# Patient Record
Sex: Female | Born: 1960 | Race: White | Hispanic: No | Marital: Single | Smoking: Never smoker
Health system: Southern US, Community
[De-identification: ages and names within clinical notes are randomized; demographics above are authoritative.]

## PROBLEM LIST (undated history)

## (undated) DIAGNOSIS — M4802 Spinal stenosis, cervical region: Secondary | ICD-10-CM

## (undated) DIAGNOSIS — M791 Myalgia, unspecified site: Secondary | ICD-10-CM

## (undated) DIAGNOSIS — R569 Unspecified convulsions: Secondary | ICD-10-CM

## (undated) DIAGNOSIS — K589 Irritable bowel syndrome without diarrhea: Secondary | ICD-10-CM

## (undated) DIAGNOSIS — G8929 Other chronic pain: Secondary | ICD-10-CM

## (undated) DIAGNOSIS — Z87442 Personal history of urinary calculi: Secondary | ICD-10-CM

## (undated) DIAGNOSIS — M2550 Pain in unspecified joint: Secondary | ICD-10-CM

## (undated) DIAGNOSIS — M2540 Effusion, unspecified joint: Secondary | ICD-10-CM

## (undated) DIAGNOSIS — G992 Myelopathy in diseases classified elsewhere: Secondary | ICD-10-CM

## (undated) DIAGNOSIS — F329 Major depressive disorder, single episode, unspecified: Secondary | ICD-10-CM

## (undated) DIAGNOSIS — Z9289 Personal history of other medical treatment: Secondary | ICD-10-CM

## (undated) DIAGNOSIS — R519 Headache, unspecified: Secondary | ICD-10-CM

## (undated) DIAGNOSIS — G939 Disorder of brain, unspecified: Secondary | ICD-10-CM

## (undated) DIAGNOSIS — G249 Dystonia, unspecified: Secondary | ICD-10-CM

## (undated) DIAGNOSIS — Z8709 Personal history of other diseases of the respiratory system: Secondary | ICD-10-CM

## (undated) DIAGNOSIS — R351 Nocturia: Secondary | ICD-10-CM

## (undated) DIAGNOSIS — M549 Dorsalgia, unspecified: Secondary | ICD-10-CM

## (undated) DIAGNOSIS — C801 Malignant (primary) neoplasm, unspecified: Secondary | ICD-10-CM

## (undated) DIAGNOSIS — K219 Gastro-esophageal reflux disease without esophagitis: Secondary | ICD-10-CM

## (undated) DIAGNOSIS — F419 Anxiety disorder, unspecified: Secondary | ICD-10-CM

## (undated) DIAGNOSIS — E119 Type 2 diabetes mellitus without complications: Secondary | ICD-10-CM

## (undated) DIAGNOSIS — M1990 Unspecified osteoarthritis, unspecified site: Secondary | ICD-10-CM

## (undated) DIAGNOSIS — R51 Headache: Secondary | ICD-10-CM

## (undated) DIAGNOSIS — I10 Essential (primary) hypertension: Secondary | ICD-10-CM

## (undated) DIAGNOSIS — F32A Depression, unspecified: Secondary | ICD-10-CM

## (undated) DIAGNOSIS — D649 Anemia, unspecified: Secondary | ICD-10-CM

## (undated) HISTORY — PX: LIPOMA EXCISION: SHX5283

## (undated) HISTORY — DX: Myalgia, unspecified site: M79.10

## (undated) HISTORY — PX: SPINE SURGERY: SHX786

## (undated) HISTORY — PX: TRACHEOSTOMY: SUR1362

## (undated) HISTORY — PX: ESOPHAGOGASTRODUODENOSCOPY: SHX1529

## (undated) HISTORY — DX: Unspecified convulsions: R56.9

## (undated) HISTORY — DX: Unspecified osteoarthritis, unspecified site: M19.90

## (undated) HISTORY — PX: CHOLECYSTECTOMY: SHX55

## (undated) HISTORY — DX: Anxiety disorder, unspecified: F41.9

---
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
## 2002-06-29 ENCOUNTER — Encounter: Payer: Self-pay | Admitting: Neurosurgery

## 2002-07-04 ENCOUNTER — Encounter: Payer: Self-pay | Admitting: Neurosurgery

## 2002-07-04 ENCOUNTER — Inpatient Hospital Stay (HOSPITAL_COMMUNITY): Admission: RE | Admit: 2002-07-04 | Discharge: 2002-07-06 | Payer: Self-pay | Admitting: Neurosurgery

## 2002-07-06 ENCOUNTER — Inpatient Hospital Stay (HOSPITAL_COMMUNITY)
Admission: RE | Admit: 2002-07-06 | Discharge: 2002-07-26 | Payer: Self-pay | Admitting: Physical Medicine & Rehabilitation

## 2002-08-14 ENCOUNTER — Encounter: Payer: Self-pay | Admitting: Neurosurgery

## 2002-08-14 ENCOUNTER — Encounter: Admission: RE | Admit: 2002-08-14 | Discharge: 2002-08-14 | Payer: Self-pay | Admitting: Neurosurgery

## 2002-09-05 ENCOUNTER — Encounter: Payer: Self-pay | Admitting: Physical Medicine & Rehabilitation

## 2002-09-05 ENCOUNTER — Encounter
Admission: RE | Admit: 2002-09-05 | Discharge: 2002-09-05 | Payer: Self-pay | Admitting: Physical Medicine & Rehabilitation

## 2002-09-25 ENCOUNTER — Encounter: Admission: RE | Admit: 2002-09-25 | Discharge: 2002-09-25 | Payer: Self-pay | Admitting: Neurosurgery

## 2002-09-25 ENCOUNTER — Encounter: Payer: Self-pay | Admitting: Neurosurgery

## 2002-12-04 ENCOUNTER — Encounter
Admission: RE | Admit: 2002-12-04 | Discharge: 2002-12-04 | Payer: Self-pay | Admitting: Physical Medicine & Rehabilitation

## 2002-12-04 ENCOUNTER — Encounter: Payer: Self-pay | Admitting: Physical Medicine & Rehabilitation

## 2003-02-18 ENCOUNTER — Encounter
Admission: RE | Admit: 2003-02-18 | Discharge: 2003-05-19 | Payer: Self-pay | Admitting: Physical Medicine & Rehabilitation

## 2003-05-20 ENCOUNTER — Encounter
Admission: RE | Admit: 2003-05-20 | Discharge: 2003-08-18 | Payer: Self-pay | Admitting: Physical Medicine & Rehabilitation

## 2003-06-27 ENCOUNTER — Ambulatory Visit (HOSPITAL_COMMUNITY)
Admission: RE | Admit: 2003-06-27 | Discharge: 2003-06-27 | Payer: Self-pay | Admitting: Physical Medicine & Rehabilitation

## 2003-06-27 ENCOUNTER — Encounter: Payer: Self-pay | Admitting: Physical Medicine & Rehabilitation

## 2003-08-01 ENCOUNTER — Encounter: Payer: Self-pay | Admitting: Neurosurgery

## 2003-08-06 ENCOUNTER — Encounter: Payer: Self-pay | Admitting: Neurosurgery

## 2003-08-06 ENCOUNTER — Inpatient Hospital Stay (HOSPITAL_COMMUNITY): Admission: RE | Admit: 2003-08-06 | Discharge: 2003-08-09 | Payer: Self-pay | Admitting: Neurosurgery

## 2003-08-09 ENCOUNTER — Encounter: Payer: Self-pay | Admitting: Physical Medicine & Rehabilitation

## 2003-08-09 ENCOUNTER — Inpatient Hospital Stay (HOSPITAL_COMMUNITY)
Admission: RE | Admit: 2003-08-09 | Discharge: 2003-08-22 | Payer: Self-pay | Admitting: Physical Medicine & Rehabilitation

## 2003-09-25 ENCOUNTER — Encounter: Admission: RE | Admit: 2003-09-25 | Discharge: 2003-09-25 | Payer: Self-pay | Admitting: Neurosurgery

## 2003-09-27 ENCOUNTER — Encounter
Admission: RE | Admit: 2003-09-27 | Discharge: 2003-12-26 | Payer: Self-pay | Admitting: Physical Medicine & Rehabilitation

## 2003-10-28 ENCOUNTER — Encounter
Admission: RE | Admit: 2003-10-28 | Discharge: 2004-01-26 | Payer: Self-pay | Admitting: Physical Medicine & Rehabilitation

## 2003-11-26 ENCOUNTER — Encounter: Admission: RE | Admit: 2003-11-26 | Discharge: 2003-11-26 | Payer: Self-pay | Admitting: Neurosurgery

## 2004-02-03 ENCOUNTER — Encounter
Admission: RE | Admit: 2004-02-03 | Discharge: 2004-05-03 | Payer: Self-pay | Admitting: Physical Medicine & Rehabilitation

## 2004-05-11 ENCOUNTER — Encounter
Admission: RE | Admit: 2004-05-11 | Discharge: 2004-06-25 | Payer: Self-pay | Admitting: Physical Medicine & Rehabilitation

## 2004-05-22 ENCOUNTER — Encounter
Admission: RE | Admit: 2004-05-22 | Discharge: 2004-08-20 | Payer: Self-pay | Admitting: Physical Medicine & Rehabilitation

## 2004-06-25 ENCOUNTER — Encounter
Admission: RE | Admit: 2004-06-25 | Discharge: 2004-09-23 | Payer: Self-pay | Admitting: Physical Medicine & Rehabilitation

## 2004-06-26 ENCOUNTER — Ambulatory Visit: Payer: Self-pay | Admitting: Physical Medicine & Rehabilitation

## 2004-07-29 ENCOUNTER — Ambulatory Visit (HOSPITAL_COMMUNITY)
Admission: RE | Admit: 2004-07-29 | Discharge: 2004-07-29 | Payer: Self-pay | Admitting: Physical Medicine & Rehabilitation

## 2004-09-08 ENCOUNTER — Ambulatory Visit: Payer: Self-pay | Admitting: Physical Medicine & Rehabilitation

## 2004-11-26 ENCOUNTER — Ambulatory Visit: Payer: Self-pay | Admitting: Physical Medicine & Rehabilitation

## 2004-11-26 ENCOUNTER — Encounter
Admission: RE | Admit: 2004-11-26 | Discharge: 2005-02-24 | Payer: Self-pay | Admitting: Physical Medicine & Rehabilitation

## 2005-01-11 ENCOUNTER — Ambulatory Visit: Payer: Self-pay | Admitting: Physical Medicine & Rehabilitation

## 2005-03-03 ENCOUNTER — Encounter
Admission: RE | Admit: 2005-03-03 | Discharge: 2005-06-01 | Payer: Self-pay | Admitting: Physical Medicine & Rehabilitation

## 2005-03-05 ENCOUNTER — Ambulatory Visit: Payer: Self-pay | Admitting: Physical Medicine & Rehabilitation

## 2005-04-09 ENCOUNTER — Ambulatory Visit: Payer: Self-pay | Admitting: Physical Medicine & Rehabilitation

## 2005-05-31 ENCOUNTER — Ambulatory Visit: Payer: Self-pay | Admitting: Physical Medicine & Rehabilitation

## 2005-06-23 ENCOUNTER — Ambulatory Visit (HOSPITAL_COMMUNITY)
Admission: RE | Admit: 2005-06-23 | Discharge: 2005-06-23 | Payer: Self-pay | Admitting: Physical Medicine & Rehabilitation

## 2005-07-06 ENCOUNTER — Ambulatory Visit: Payer: Self-pay | Admitting: Physical Medicine & Rehabilitation

## 2005-07-06 ENCOUNTER — Encounter
Admission: RE | Admit: 2005-07-06 | Discharge: 2005-10-04 | Payer: Self-pay | Admitting: Physical Medicine & Rehabilitation

## 2005-08-10 ENCOUNTER — Ambulatory Visit (HOSPITAL_COMMUNITY)
Admission: RE | Admit: 2005-08-10 | Discharge: 2005-08-10 | Payer: Self-pay | Admitting: Physical Medicine & Rehabilitation

## 2005-09-02 ENCOUNTER — Ambulatory Visit: Payer: Self-pay | Admitting: Physical Medicine & Rehabilitation

## 2005-09-03 ENCOUNTER — Encounter
Admission: RE | Admit: 2005-09-03 | Discharge: 2005-09-03 | Payer: Self-pay | Admitting: Physical Medicine & Rehabilitation

## 2005-11-23 ENCOUNTER — Ambulatory Visit: Payer: Self-pay | Admitting: Physical Medicine & Rehabilitation

## 2005-11-23 ENCOUNTER — Encounter
Admission: RE | Admit: 2005-11-23 | Discharge: 2006-02-21 | Payer: Self-pay | Admitting: Physical Medicine & Rehabilitation

## 2005-12-30 ENCOUNTER — Ambulatory Visit: Payer: Self-pay | Admitting: Physical Medicine & Rehabilitation

## 2006-01-13 ENCOUNTER — Ambulatory Visit: Payer: Self-pay | Admitting: Physical Medicine & Rehabilitation

## 2006-02-14 ENCOUNTER — Encounter
Admission: RE | Admit: 2006-02-14 | Discharge: 2006-05-15 | Payer: Self-pay | Admitting: Physical Medicine & Rehabilitation

## 2006-02-14 ENCOUNTER — Ambulatory Visit: Payer: Self-pay | Admitting: Physical Medicine & Rehabilitation

## 2006-05-09 ENCOUNTER — Ambulatory Visit: Payer: Self-pay | Admitting: Physical Medicine & Rehabilitation

## 2006-06-10 ENCOUNTER — Encounter
Admission: RE | Admit: 2006-06-10 | Discharge: 2006-09-08 | Payer: Self-pay | Admitting: Physical Medicine & Rehabilitation

## 2006-06-10 ENCOUNTER — Ambulatory Visit: Payer: Self-pay | Admitting: Physical Medicine & Rehabilitation

## 2006-06-21 ENCOUNTER — Ambulatory Visit: Payer: Self-pay | Admitting: Physical Medicine & Rehabilitation

## 2006-09-20 ENCOUNTER — Ambulatory Visit: Payer: Self-pay | Admitting: Physical Medicine & Rehabilitation

## 2006-09-20 ENCOUNTER — Encounter
Admission: RE | Admit: 2006-09-20 | Discharge: 2006-12-19 | Payer: Self-pay | Admitting: Physical Medicine & Rehabilitation

## 2007-02-02 ENCOUNTER — Encounter
Admission: RE | Admit: 2007-02-02 | Discharge: 2007-05-03 | Payer: Self-pay | Admitting: Physical Medicine & Rehabilitation

## 2007-02-02 ENCOUNTER — Ambulatory Visit: Payer: Self-pay | Admitting: Physical Medicine & Rehabilitation

## 2007-04-04 ENCOUNTER — Ambulatory Visit: Payer: Self-pay | Admitting: Physical Medicine & Rehabilitation

## 2007-05-05 ENCOUNTER — Encounter
Admission: RE | Admit: 2007-05-05 | Discharge: 2007-05-08 | Payer: Self-pay | Admitting: Physical Medicine & Rehabilitation

## 2007-05-05 ENCOUNTER — Ambulatory Visit: Payer: Self-pay | Admitting: Physical Medicine & Rehabilitation

## 2007-10-24 ENCOUNTER — Encounter
Admission: RE | Admit: 2007-10-24 | Discharge: 2008-01-01 | Payer: Self-pay | Admitting: Physical Medicine & Rehabilitation

## 2007-10-24 ENCOUNTER — Ambulatory Visit: Payer: Self-pay | Admitting: Physical Medicine & Rehabilitation

## 2008-01-18 ENCOUNTER — Encounter
Admission: RE | Admit: 2008-01-18 | Discharge: 2008-01-22 | Payer: Self-pay | Admitting: Physical Medicine & Rehabilitation

## 2008-01-22 ENCOUNTER — Ambulatory Visit: Payer: Self-pay | Admitting: Physical Medicine & Rehabilitation

## 2008-04-12 ENCOUNTER — Encounter
Admission: RE | Admit: 2008-04-12 | Discharge: 2008-07-11 | Payer: Self-pay | Admitting: Physical Medicine & Rehabilitation

## 2008-04-15 ENCOUNTER — Ambulatory Visit: Payer: Self-pay | Admitting: Physical Medicine & Rehabilitation

## 2008-06-11 ENCOUNTER — Ambulatory Visit: Payer: Self-pay | Admitting: Physical Medicine & Rehabilitation

## 2008-10-07 ENCOUNTER — Encounter
Admission: RE | Admit: 2008-10-07 | Discharge: 2008-10-08 | Payer: Self-pay | Admitting: Physical Medicine & Rehabilitation

## 2008-10-08 ENCOUNTER — Ambulatory Visit: Payer: Self-pay | Admitting: Physical Medicine & Rehabilitation

## 2009-04-10 ENCOUNTER — Encounter
Admission: RE | Admit: 2009-04-10 | Discharge: 2009-04-16 | Payer: Self-pay | Admitting: Physical Medicine & Rehabilitation

## 2009-04-16 ENCOUNTER — Ambulatory Visit: Payer: Self-pay | Admitting: Physical Medicine & Rehabilitation

## 2009-07-02 ENCOUNTER — Encounter
Admission: RE | Admit: 2009-07-02 | Discharge: 2009-07-04 | Payer: Self-pay | Admitting: Physical Medicine & Rehabilitation

## 2009-07-04 ENCOUNTER — Ambulatory Visit: Payer: Self-pay | Admitting: Physical Medicine & Rehabilitation

## 2010-06-08 ENCOUNTER — Encounter
Admission: RE | Admit: 2010-06-08 | Discharge: 2010-08-28 | Payer: Self-pay | Admitting: Physical Medicine & Rehabilitation

## 2010-06-10 ENCOUNTER — Ambulatory Visit: Payer: Self-pay | Admitting: Physical Medicine & Rehabilitation

## 2010-08-28 ENCOUNTER — Encounter
Admission: RE | Admit: 2010-08-28 | Discharge: 2010-10-13 | Payer: Self-pay | Source: Home / Self Care | Attending: Physical Medicine & Rehabilitation | Admitting: Physical Medicine & Rehabilitation

## 2010-09-07 ENCOUNTER — Ambulatory Visit: Payer: Self-pay | Admitting: Physical Medicine & Rehabilitation

## 2010-10-13 ENCOUNTER — Encounter
Admission: RE | Admit: 2010-10-13 | Discharge: 2010-10-23 | Payer: Self-pay | Source: Home / Self Care | Attending: Physical Medicine & Rehabilitation | Admitting: Physical Medicine & Rehabilitation

## 2010-10-19 ENCOUNTER — Ambulatory Visit: Payer: Self-pay | Admitting: Physical Medicine & Rehabilitation

## 2010-10-23 ENCOUNTER — Encounter
Admission: RE | Admit: 2010-10-23 | Discharge: 2010-11-17 | Payer: Self-pay | Source: Home / Self Care | Attending: Physical Medicine & Rehabilitation | Admitting: Physical Medicine & Rehabilitation

## 2010-10-27 ENCOUNTER — Ambulatory Visit
Admission: RE | Admit: 2010-10-27 | Discharge: 2010-10-27 | Payer: Self-pay | Source: Home / Self Care | Attending: Physical Medicine & Rehabilitation | Admitting: Physical Medicine & Rehabilitation

## 2010-11-04 ENCOUNTER — Ambulatory Visit
Admission: RE | Admit: 2010-11-04 | Discharge: 2010-11-04 | Payer: Self-pay | Source: Home / Self Care | Attending: Physical Medicine & Rehabilitation | Admitting: Physical Medicine & Rehabilitation

## 2010-11-08 ENCOUNTER — Encounter: Payer: Self-pay | Admitting: Physical Medicine & Rehabilitation

## 2011-03-02 NOTE — Assessment & Plan Note (Signed)
Kendra [name] is back regarding her traumatic brain injury.  She is complaining  of similar pain in left shoulder since I saw her.  Her spasms and  movement seem to be better with the ReQuip.  She still uses Klonopin,  but finds the Klonopin makes her overly sedated.  The patient rates her  shoulder pain as 6-7/10.  Pain interferes with general activity,  relationship with others, and enjoyment of life on a moderate level.   REVIEW OF SYSTEMS:  Notable for trouble walking, spasms, dizziness,  diarrhea, and occasional limb swelling.  Mood has been stable.   SOCIAL HISTORY:  The patient is single.  Living with her mother who came  with her today.   PHYSICAL EXAMINATION:  VITAL SIGNS:  Blood pressure is 118/73, pulse is  63, respiratory rate 18, and she is sating 96% on room air.  GENERAL:  The patient is pleasant, alert, and oriented x3.  She has  continued dystonic movements, but seems to be less repetitive and  focused on these today.  She continues to have crepitus in both the knee  and shoulders.  Cognition is stable.  She seems to be fairly alert.  HEART:  Regular.  CHEST:  Clear.  ABDOMEN:  Soft and nontender.   ASSESSMENT:  1. 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  The patient is somewhat sedated, however, with      Klonopin.  2. 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  3. Seizure disorder.  4. Obesity.   PLAN:  1. We will titrate ReQuip to 1-2 mg at bedtime.  2. Wean Klonopin off during day and utilize only 1 mg at night.  3. Continue Keppra and Celebrex dose with hydrocodone 7.5 for      breakthrough pain.  4. After informed consent, we injected the left shoulder via lateral      approach with 40 mg of Kenalog and 3 mL 1% lidocaine.  The patient      tolerated it well.  I will see her back in about 3-4 months' time.      Kendra [name], M.D.  Electronically Signed     ZTS/MedQ  D:  10/08/2008  13:26:46  T:  10/09/2008 05:37:52  Job #:  [id number]

## 2011-03-02 NOTE — Assessment & Plan Note (Signed)
Kendra [name] is back regarding her traumatic brain injury and dyskinesias.  She  has done very nicely with the changes we made last time including  adjusting Klonopin slightly and increasing hydrocodone for more severe  pain.  The ReQuip at night is helping sleep.  She is getting around  better and having less shoulder pain as a result.  Pain is to 3-4/10.  She describes it currently as dull and constant.  Pain interferes with  general activity, relation with others, and enjoyment of life on a  moderate level.   REVIEW OF SYSTEMS:  Notable for occasional spasms, confusion,  depression, weakness, and abdominal pain.  The Klonopin does make her a  bit sleepy, but tolerable.   SOCIAL HISTORY:  The patient is single, living with her mother.   PHYSICAL EXAMINATION:  VITAL SIGNS:  Blood pressure is 129/83, pulse is  84, respiratory rate 18, and she is sating 95% on room air.  GENERAL:  The patient is pleasant, alert, and oriented x3.  Continues to  have some dystonic positions, but no repetitive movements seen today.  She has some crepitus at the knees and left shoulder.  Reflexes are  hyperactive on the left.  HEART:  Regular.  CHEST:  Clear.  ABDOMEN:  Soft and nontender.  NEUROLOGICAL:  She is alert and appropriate with me.   ASSESSMENT:  1. Traumatic brain injury with persistent dyskinesias, although these      have improved with the current regimen.  2. 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  3. Seizure disorder.  4. Obesity.   PLAN:  1. Continue ReQuip 0.5 mg half-to-one tablet at bed.  2. Hydrocodone 7.5 for breakthrough pain.  3. Klonopin 0.25 to 0.5 in the morning, 0.5 in the afternoon, and 1 mg      at night.  4. Continue Keppra and Celebrex, as dosed.  5. I will see her back in about 4 months.      Ranelle Oyster, M.D.  Electronically Signed     ZTS/MedQ  D:  06/11/2008 13:20:47  T:  06/12/2008 03:09:19  Job #:  [id number]

## 2011-03-02 NOTE — Assessment & Plan Note (Signed)
Kendra [name] is back regarding her traumatic brain injury.  She continues to  have problems with spasm and left shoulder pain.  She is taking Klonopin  0.25 essentially during the morning and afternoon and 0.5 to 0.75 at  night.  She was not able to tolerate the Keppra at higher doses, so she  is back down to half to one three times a day, depending on how she  feels.  Shoulder injections worked for her temporarily but not more than  a few weeks to a month or so.  She rates her pain as a 7/10.  She  describes it as sharp, stabbing, constant, aching.  Sleep is fair except  for spasms and shoulder tenderness.  The symptoms definitely are worse  at night.   REVIEW OF SYSTEMS:  Notable for trouble walking, spasms, tingling.  She  did receive her scooter and is using within the house but they do not  have a way to put it in the car as of yet.  She does have some  occasional anxiety attacks, particularly in social situations.   SOCIAL HISTORY:  The patient is single, living with her parents who  remain extremely supportive.   PHYSICAL EXAMINATION:  VITAL SIGNS:  Blood pressure is 135/75, pulse 81,  respiratory rate 18.  She is sating 95% on room air.  GENERAL:  The patient is pleasant, alert and oriented x3.  Affect is  bright and appropriate.  MUSCULOSKELETAL:  Gait is essentially stable.  She has some internal  rotation to the left leg and has poor knee control.  The left arm  generally is at rest but when she starts to focus on her symptoms, it  tends to externally rotate and extend at the elbow.  She has persistent  crepitus at the left shoulder and elbow as well as knee.  She has pain  with rotator cuff impingement maneuvers.  HEART:  Regular.  CHEST:  Clear.  ABDOMEN:  Soft, nontender.  SKIN:  Intact.  MENTATION:  Unchanged.   ASSESSMENT:  1. Traumatic brain injury with movement disorder.  2. Degenerative joint disease, left shoulder and knee.  3. Seizure disorder.  4. Obesity.   PLAN:  1. Continue Keppra as tolerated 250-500 mg t.i.d.  2. We will increase Klonopin to 0.5 mg daily at breakfast and lunch      with 1 mg at dinner/bedtime.  3. The patient was given samples of Amrix to try as well, depending on      what her response to the Klonopin.  Unfortunately, I do not believe      she will be able to go too high on the Klonopin as neuro-sedating      side effects usually predominate on any higher dose of medication.  4. Continue scooter use.  5. Continue range of motion as tolerated to the left shoulder and      knee.  6. I refilled Celebrex 200 mg daily.  7. Continue Sinemet at night.  8. I will see her back in 3 months' time.      Ranelle Oyster, M.D.  Electronically Signed     ZTS/MedQ  D:  01/22/2008 11:05:42  T:  01/22/2008 11:31:45  Job #:  [id number]

## 2011-03-02 NOTE — Assessment & Plan Note (Signed)
Kendra [name] is back regarding her traumatic brain injury and chronic left-sided  dyskinesias and dystonia.  She is complaining of occasional spasms in  the arms and legs solely relegated to the left side.  She asked if it  was related to her blood pressure medication, Toprol.  Otherwise,  medications have not changed.  She is still awaiting approval of her  power chair.  I had sent of information last month but apparently the  [employer] had not received this.   Otherwise patient has been about the same.  She remains on Keppra and  Sinemet as well as low doses of Klonopin.  She uses Celebrex daily for  any inflammatory effects.  She is going back to [employer] for shoe  modification.  Patient relates her pain as 6 out of 10, described as  sharp and stabbing.   REVIEW OF SYSTEMS:  Notable for the above as well as anxiety and some  dizziness on occasion and limb swelling.   SOCIAL HISTORY:  Is without change.   PHYSICAL EXAMINATION:  Blood pressure is 112/73, pulse is 80,  respiratory rate 16, she is sating 100% room air.  The patient is  genuinely pleasant, alert and oriented.  HEART:  Regular.  CHEST:  Clear.  ABDOMEN:  Soft, nontender.  She continues to have dyskinetic movements in the left upper extremity  with some crepitus noted at the shoulder and to a lesser extent the  elbow.  The upper left bicep muscle is somewhat tender but no bruising  or swelling is appreciated.  Reflexes remain 3+ on the left side  throughout.  Left leg remains weak to a certain extent with dystonia  noted as well.  Weight remains stable.  Skin breakdown is seen in  general.   ASSESSMENT:  1. 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  2. 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  3. Seizure disorder.   PLAN:  1. Will resend paperwork for power chair.  2. Continue Keppra 500 mg t.i.d. for seizure prophylaxis as well as  Sinemet at nighttime and Klonopin twice daily.  3. Discussed other modalities for spasm including stretching, heat,      and ice which the patient will consider.  I do not think this is in      any way related to her blood pressure medication.  4. I will see her back in about 4 month's time.      Ranelle Oyster, M.D.  Electronically Signed     ZTS/MedQ  D:  04/07/2007 12:59:27  T:  04/07/2007 16:07:13  Job #:  [id number]

## 2011-03-02 NOTE — Assessment & Plan Note (Signed)
HISTORY OF PRESENT ILLNESS:  Kendra [name] is back regarding her traumatic brain  injury.  Her left arm has been bothering her more of late.  It is  keeping her up at night.  We have tried multiple injections on it in the  past, particularly Botox, to help with dystonia.  She has had some  transient relief.  The Klonopin seems to be doing better than anything  as far as controlling the movements, but she still has these from time  to time and they seem to be increasing with stress she has been under.  Her father recently passed away and they are having to move from their  home due to the highway coming [place].  The patient rates  her pain as 6/10 today.  The pain is most prominent in the left  shoulder, as well as the right knee.  Pain interferes with her general  activity, relations with others, enjoyment of life on a moderate level.  Relief is fair to poor.   REVIEW OF SYSTEMS:  Notable for anxiety, dizziness, trouble walking.  Other pertinent positives listed above and full review is in the written  health history section in the chart.   SOCIAL HISTORY:  Unchanged other than that mentioned above.  Her mother  remains very supportive.   PHYSICAL EXAMINATION:  VITAL SIGNS:  Blood pressure is 122/78, pulse of  73, respiratory rate 18.  She is satting 96% on room air.  EXTREMITIES:  Continues to walk with an internally rotated left leg.  Knee control is poor.  She does better with the brace, but this only can  control so much of her movement.  Left arm tends to fluctuate in  position and tone.  Reflexes are 3+ on the left side.  She has  persistent crepitus in the knee and left shoulder.  Rotator cuff  impingement maneuvers are positive.  HEART:  Regular rate.  CHEST:  Clear.  ABDOMEN:  Soft, nontender.  SKIN: Intact.  MENTATION:  Stable.   ASSESSMENT:  1. Traumatic brain injury.  2. Degenerative joint disease of left shoulder and knee.  3. Seizure disorder.  4. Obesity.   PLAN:  1. Continue Keppra and Klonopin for movement disorder, as well as      seizure prophylaxis.  Sinemet is also on board for movement      control.  2. After informed consent, we injected the left shoulder via the      posterior approach with 40 mg of Kenalog and 3 cc 1% lidocaine.      The patient tolerated well.  3. Consider alternative mobility to take load off her legs and      shoulder.  4. Discussed pool therapy again with the patient, as I think this      would be beneficial for balance and exercise.  5. I will see her back in 3 months.      Ranelle Oyster, M.D.  Electronically Signed     ZTS/MedQ  D:  10/25/2007 10:23:54  T:  10/25/2007 10:46:39  Job #:  [id number]

## 2011-03-02 NOTE — Assessment & Plan Note (Signed)
HISTORY:  Sienna is back regarding her multiple issues.  Left shoulder  continues to be a problem with pain now being an issue at night time  more so than previously.  Vicodin then seemed to be helping her as much.  She hesitates using a lot of Klonopin  at night to relax as she becomes  drowsy in the morning.  She has tried some cyclobenzaprine and this  helps to a certain extent.  She denies any further seizures.  Her arm  will twist and rotate often.  She has had some problem with the  abdominal pain for what she seeing GI.  Apparently, she is being put on  antispasmodic.   REVIEW OF SYSTEMS:  Notable for the above as well as some diarrhea  related to the GI problems.  Full review is written out in the history  section in the chart.   SOCIAL HISTORY:  Unchanged.  Mother is here with her today.   PHYSICAL EXAMINATION:  VITAL SIGNS:  Blood pressure is 119/76, pulse 69,  respiratory rate 16, and she is sating 95% on room air.  GENERAL:  The patient is pleasant.  EXTREMITIES:  She continues to have internal rotation and extension at  the elbow on the left side.  Knee control is fair to poor with her  brace.  She has persistent crepitus at both knees as well as left  shoulder.  She does tend to relax a bit when distracted.  Reflexes are  hyperactive on the left side.  HEART:  Regular.  CHEST:  Clear.  ABDOMEN:  Soft and nontender.  PSYCHIATRY:  Mentation is stable.  SKIN:  Intact.   ASSESSMENT:  1. Traumatic brain injury with persistent dyskinesias.  2. Degenerative joint disease particularly to left shoulder and knee,      exacerbated by traumatic brain injury with persistent of      dyskinesias.  3. Seizure disorder.  4. Obesity.   PLAN:  1. We will stop Sinemet and give the patient a trial of ReQuip 0.5 mg      1/2 to 1 at bedtime.  2. We will increase hydrocodone to 7.5/325.  3. Continue Klonopin and Keppra dose as well as Celebrex daily.  4. Recommend the use of power  wheelchair for longer distances around      the house and outside the house if possible.  5. I will see her back in about 2 months' time.      Ranelle Oyster, M.D.  Electronically Signed     ZTS/MedQ  D:  04/15/2008 11:14:06  T:  04/15/2008 23:51:41  Job #:  [id number]

## 2011-03-02 NOTE — Assessment & Plan Note (Signed)
HISTORY OF PRESENT ILLNESS:  Kendra [name] is back regarding her traumatic brain  injury suffered as a child with chronic left sided weakness, dystonia  and dyskinesias.  Kendra [name] tells me she is still awaiting her wheelchair  despite our efforts to send her prescription.  Apparently, she is a  formal wheelchair examination.   Kendra [name] continues to struggle with her gait.  Her walking and stability  have declined over the last few years due to her chronic pain and  arthritis and are previously affected extremities.  Her fluctuating tone  continues to be an issue in the setting of her ongoing weakness on the  left side.  Kendra [name] has been motivated to try to improve on her gait and  stay ambulatory, but is having more problems with falling, pain in the  knee and ankle despite racing efforts and multiple other modalities that  we have tried to improve her quality of gait, stance, etc.   Kendra [name] continues to struggle with self care tasks such as preparing meals,  moving from room to room, getting to the toilet due to pain in her left  leg, instability in the left side and general and ongoing weakness in  the left arm and leg.   Kendra [name] rates her pain as a 7/10, describes it as sharp, stabbing, aching  and intermittent.  Pain interferes with general activity, relationship  with others and enjoyment of life on a moderate to severe level.  Sleep  is fair.   REVIEW OF SYSTEMS:  Notable for tingling, trouble walking, spasms,  dizziness, anxiety, night sweats.  She has had limb swelling as well.  She has had no recent seizure activity.   SOCIAL HISTORY:  The patient is single and living with her parents who  remain very supportive.   PHYSICAL EXAMINATION:  VITAL SIGNS:  Blood pressure 133/78, pulse 61,  respirations 18, saturating 95% on room air.  GENERAL:  The patient is awake and appropriate.  NEUROLOGICAL:  She is oriented x3.  She walks with an unstable gait with  a left leg internally rotated in the left ankle  with significant various  deformity.  She has poor knee control as well, and this leads to a very  shaky stance phase of gait on the left side.  She also continues to have  movement and poor control of the left upper extremity with significant  elbow flexion noted today and tone in the biceps muscle.  Reflexes are  generally 3+ on the left side.  Strength fluctuates from 2-4/5 depending  on the muscle tested today with significant dystonia noted.  The patient  remains obese.  She has pain with palpation and crepitus noted in both  the left knee and left shoulder today.  Cognitively, she is intact with  good awareness and insight.  She is very alert and appropriate.  No  impulsivity was seen today.  Cranial nerve exam was generally intact.  Speech was slightly dysarthric.  HEART:  Regular rate and rhythm.  CHEST:  Clear to auscultation bilaterally.  ABDOMEN:  Soft and nontender.  SKIN:  Intact.   The patient is wearing a left double upright AFO with a T-strap to help  control her varus deformity, yet the ankle and leg still moves within  the brace, no matter how tightly it is adjusted.   ASSESSMENT:  1. Traumatic brain injury as a child ([age]) with chronic left sided      weakness, spastic hemiparesis and dystonia.  2. Degenerative arthritis  of the left shoulder and knee.  3. Seizure disorder.  4. Obesity.   PLAN:  1. The patient is in need of a power wheelchair at this time due to      the declining quality of her gait and stability.  The patient is      physically and mentally capable of operating a power chair in her      home.  She is very motivated to use this in an appropriate measure      to improve her activities of daily living and increase her      independent mobility.  The effect of the patient's spastic left      hemiparesis and dyskinesias upon ADLs are noted above in the      history of present illness.  Kendra [name] is unable to use the cane or      walker at this point  due to her declining stability.  She is unable      to self propel a manual chair due to the dystonia in her left arm,      and the scooter would not provide her adequate balance or freedom      in safety and transfer.  We will resend paperwork to the scooter      company after this visit.  2. The patient will continue on Keppra and Sinemet as well as Klonopin      for tone control as well as seizure prophylaxis.  3. Recommend seated activities for exercise including stationary bike      and aquatic therapy if she has assistance into the pool and within      the pool itself.  4. I will see Kendra [name] back in about six months time.      Ranelle Oyster, M.D.  Electronically Signed     ZTS/MedQ  D:  05/08/2007 12:39:04  T:  05/08/2007 13:43:01  Job #:  [id number]

## 2011-03-02 NOTE — Assessment & Plan Note (Signed)
Kendra [name] is here in followup of her spastic left hemiparesis and traumatic  brain injury.  She states that she saw Dr. Theotis Barrio in [place], who had  mentioned doing infusion to her left foot, and stated that she may need  a different type of brace.  Kendra [name] is a bit hesitant to pursue that as is  her mother.  She continues to have pain in her ankle and leg as in other  areas.  Sometimes pain is throbbing.  Pain today is 8/10.  She uses her  Keppra, Klonopin, Celebrex, ReQuip still for her dystonic movements.  She has received her wheelchair both powered and manual and uses them,  but limited with her power chair due to the size of the chair and not  being able to fit through the threshold of doors.  Apparently, she and  her mother looking in to moving as the town is taking over the property  for a new highway extension.   REVIEW OF SYSTEMS:  Notable for the above as well as trouble walking,  spasms, anxiety, weight gain, limb swelling, shortness of breath.  Other  pertinent positives are as above.  Full review is in the written health  and history section of the chart.   SOCIAL HISTORY:  The patient is single, living with her parents.  As  noted above.   PHYSICAL EXAMINATION:  VITAL SIGNS:  Blood pressure is 119/63, pulse is  68, and respiratory rate is 18.  She is sating 94% on room air.  GENERAL:  The patient continues to have significant varus moments at the  left knee and ankle, which destabilizes her gait.  She has dystonic  movements of the foot, was continued to fluctuate from a flaccid  position to spastic position.  She tends to definitely walk on the outer  part of her sole.  Toes tend to plantar flex.  Cognition is near  baseline.  She has ongoing dystonic movements of left upper extremity.  HEART:  Regular.  CHEST:  Clear.  ABDOMEN:  Soft and nontender.   ASSESSMENT:  1. Traumatic brain injury with persistent dyskinesias on the left      side.  2. Degenerative joint disease  of the left shoulder, knee, and ankle.  3. Seizure disorder.  4. Obesity.   PLAN:  1. We talked at length today regarding options here.  I do not believe      surgery overall is going to be very helpful.  We could consider a      knee ankle-foot orthosis to stabilize the knee further, but      certainly that will interfere with transfers.  I think she needs to      make use of her chairs and keep her walking time down to minimize      trauma to the foot and ankle.  I would like to get her over to      aquatic therapy to see if we can improve gait and strength in to a      certain extent, although not expecting a miracles there.  2. She will follow up with me in 3 months.  I will await word from      therapy to decide on a KAFO.  Mom and the patient agreed with plan.      Ranelle Oyster, M.D.  Electronically Signed     ZTS/MedQ  D:  04/16/2009 15:05:26  T:  04/17/2009 04:19:18  Job #:  [id number]

## 2011-03-03 ENCOUNTER — Encounter: Payer: Medicare Other | Attending: Physical Medicine & Rehabilitation | Admitting: Physical Medicine & Rehabilitation

## 2011-03-03 DIAGNOSIS — S069X9A Unspecified intracranial injury with loss of consciousness of unspecified duration, initial encounter: Secondary | ICD-10-CM

## 2011-03-03 DIAGNOSIS — E669 Obesity, unspecified: Secondary | ICD-10-CM | POA: Insufficient documentation

## 2011-03-03 DIAGNOSIS — M25519 Pain in unspecified shoulder: Secondary | ICD-10-CM | POA: Insufficient documentation

## 2011-03-03 DIAGNOSIS — S069XAS Unspecified intracranial injury with loss of consciousness status unknown, sequela: Secondary | ICD-10-CM | POA: Insufficient documentation

## 2011-03-03 DIAGNOSIS — S069X9S Unspecified intracranial injury with loss of consciousness of unspecified duration, sequela: Secondary | ICD-10-CM | POA: Insufficient documentation

## 2011-03-03 DIAGNOSIS — R569 Unspecified convulsions: Secondary | ICD-10-CM

## 2011-03-03 DIAGNOSIS — M19019 Primary osteoarthritis, unspecified shoulder: Secondary | ICD-10-CM

## 2011-03-03 DIAGNOSIS — X58XXXS Exposure to other specified factors, sequela: Secondary | ICD-10-CM | POA: Insufficient documentation

## 2011-03-03 DIAGNOSIS — G8110 Spastic hemiplegia affecting unspecified side: Secondary | ICD-10-CM

## 2011-03-03 DIAGNOSIS — R279 Unspecified lack of coordination: Secondary | ICD-10-CM | POA: Insufficient documentation

## 2011-03-03 DIAGNOSIS — G40909 Epilepsy, unspecified, not intractable, without status epilepticus: Secondary | ICD-10-CM | POA: Insufficient documentation

## 2011-03-03 DIAGNOSIS — M129 Arthropathy, unspecified: Secondary | ICD-10-CM | POA: Insufficient documentation

## 2011-03-03 DIAGNOSIS — M25569 Pain in unspecified knee: Secondary | ICD-10-CM | POA: Insufficient documentation

## 2011-03-03 NOTE — Assessment & Plan Note (Signed)
Kendra [name] is back regarding her pain issues.  She recently was on a trip to [place] with her family and was up moving a bit more.  States that she is having left knee and left shoulder pain.  Left shoulder has been a problem for sometime as have the knees.  She reports some increase in restless leg symptoms at night.  The ReQuip has helped to a certain extent, but 3 out of 7 nights a week she is having problem sleeping. Pain overall is described as stabbing and aching.  REVIEW OF SYSTEMS:  Notable for the above.  Full 12-point review is in the written health and history section of the chart.  SOCIAL HISTORY:  The patient is single, living with her mother who is with her today as always.  PHYSICAL EXAMINATION:  VITAL SIGNS:  Blood pressure is 108/69, pulse is 59, respiratory rate 18, she is satting 95% on room air. GENERAL:  The patient is pleasant, alert, and oriented x3.  Affect is generally bright and appropriate. MUSCULOSKELETAL:  Left shoulder is notable for crepitus with active movement.  She continues to have her chronic movement disorder involving the left upper extremity with these dyskinesias noted.  Left knee have chronic changes consistent with arthritis.  Did not provocatively examine those areas in depth today. PSYCHIATRIC:  Cognitively, she is at her baseline.  She was using her wheelchair today for mobility.  ASSESSMENT: 1. Traumatic brain injury with persistent dyskinesias and arthritis     involving both knees, ankles, and left shoulder. 2. Seizure disorder. 3. Obesity.  PLAN: 1. Reviewed appropriate safe and sensible activity levels. 2. I injected the left shoulder via lateral approach with 40 mg     Kenalog and 3 mL of 1% lidocaine.  The patient tolerated well. 3. Increased ReQuip to 4 mg bedtime for restless leg symptoms at     night. 4. Norco is refilled via fax. 5. I will see her back here in about 4 months' time.  All patient's     questions were answered  today.     Ranelle Oyster, M.D. Electronically Signed    ZTS/MedQ D:  03/03/2011 11:46:06  T:  03/03/2011 23:06:46  Job #:  [id number]

## 2011-03-05 NOTE — Op Note (Signed)
NAME:  Kendra [name], Kendra [name]                          ACCOUNT NO.:  [id number]   MEDICAL RECORD NO.:  [id number]                   PATIENT TYPE:  INP   LOCATION:  3008                                 FACILITY:  [facility]   PHYSICIAN:  Kathaleen Maser. Pool, M.D.                 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  Status post L4 through S1 posterolateral fusion with  instrumentation.   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  Status post L4 through S1 posterolateral fusion with  instrumentation.  Superior end plate fracture and pedicle fracture of L4  bilaterally.   OPERATION PERFORMED:  Re-exploration of L3-4 laminectomy with complete L3  laminectomy and foraminotomies.  Open reduction of superior fracture of L4.  Exploration of L4 through S1 posterolateral fusion.  Removal of hardware.  L3-4 posterior lumbar interbody fusion utilizing tangent wedges and local  autograft.  L3 through S1 posterolateral fusion utilizing segmental pedicle  screw instrumentation and local autografting.   SURGEON:  Kathaleen Maser. Pool, M.D.   ASSISTANT:  Reinaldo Meeker, M.D.   ANESTHESIA:  General endotracheal.   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  She is status post L4 through S1 decompression and  fusion with good results.  The patient has suffered the acute onset of  worsening back and left lower extremity symptoms failing all conservative  management.  Workup has demonstrated evidence of breakdown superior to the  fusion at the L3-4 level with what appears to be a large paracentral disk  herniation with an inferior fragment.  The patient presents now for  decompression and fusion at the L3-4 level.  Her old fusion will be explored  and extended.   DESCRIPTION OF PROCEDURE:  The patient was taken to the operating room and  placed on the table in the supine position.  After adequate level of  anesthesia was achieved, the patient was positioned prone onto a Wilson  frame and appropriately padded.  The patient's lumbar region was prepped and  draped sterilely.  A 10 blade was used to make a linear skin incision  extending from L2 down to the sacrum.  This was carried down sharply in the  midline.  A subperiosteal dissection was then performed exposing the lamina  and facet joints of L2-L3 as well as the posterolateral fusion of L4 through  S1.  The locking caps were removed from the screws at L4-L5 and S1.  The  rods were removed.  It became apparent at this point that the superior  screws of the construct were now free-floating and there had been  significant fracturing of the pedicle itself.  Reviewing the patient's  intraoperative x-ray and MRI scan, it then became apparent that the patient  had not suffered merely a disk herniation at the L3-L4 level but had  actually fractured through the superior end plate extending into both  pedicles bilaterally.  The fusion below, however, were quite solid.  The  laminectomy at L3-L4 was then dissected free.  The lamina of L3 was  completely resected as were the inferior facets of L3 and superior facets of  L4.  All bone was cleaned and used in later autografting.  Epidural scar was  resected.  Underlying thecal sac and exiting L3 and L4 nerve roots were  identified.  Wide foraminotomy was performed along the course.  Starting  first at the patient's left side, thecal sac and nerve roots were mobilized  and retracted toward the midline.  The disk space was then  incised with a 15  blade in rectangular fashion.  A wide disk space clean-out was then achieved  pituitary rongeurs and upward angled pituitary rongeurs and Epstein curets.  The retropulsed bone from the superior end plate fracture at L4 was then  pushed into the disk space and then completely resected.  All elements of  the fracture were completely debrided and reduced.  The procedure was then  repeated on the contralateral side.  The bone quality itself appeared quite  good.  There was no evidence of osteoporosis.  Options were considered and  it was decided to proceed with interbody fusion although the end plate was  obviously bone, the bone appeared to support it.  The disk space was then  prepared bilaterally using curets.  Starting first on the patient's left  side, 12 mm tangent chisel was then used to clear a trough down to the  anterior cortical margin.  Soft tissues were removed from this trough.  A 12  x 26 mm tangent wedge was then impacted into place and recessed  approximately 4 mm from the posterior cortical margin.  This had good bone  interface both superiorly and inferiorly.  The procedure was then repeated  on the contralateral side again protecting thecal sac and nerve roots.  Prior to installation of the second wedge, morselized autograft was packed  into the interspace.  The second wedge was then impacted into place and  again recessed approximately 4 mm from the posterior cortical margin.  Good  purchase of the bone above and below was achieved.  The pedicles of L3 were  then isolated bilaterally.  Superficial bone overlying the pedicle was  removed using a high speed drill.  Each pedicle was then probed using  pedicle awl.  Each pedicle awl tract was then tapped with a 5.22mm screw  tap.  Each screw tap hole was probed and found to be solidly within bone.  6.75 x 45 mm spiral 90 screws placed bilaterally at L3.  Although it would have been technically possible, it  was decided not necessary to try to  replace screws into the body of 4.  The screws at L5 and S1 were quite solid  and the construct below was obviously intact.  The transverse processes at  L3 and L4 were then decorticated using a high-speed drill.  Morselized  autograft was packed posterolaterally.  A segmented titanium rod was then  contoured and placed over the L3, L5 and S1 screw heads.  Locking caps were  then placed over the screw heads.  The locking caps were then engaged in a  sequential fashion.  This was down with the construct under compression.  Transverse connector was placed.  Gelfoam was placed for hemostasis which  was found to be good.  A medium Hemovac drain was left in the epidural  space.  Final images revealed good position of bone grafts and hardware at  the proper operative level with normal alignment of the spine.  The wound  was then irrigated and closed in typical fashion. Steri-Strips and sterile  dressing were applied. There were no apparent complications.  The patient  tolerated the procedure well and returned to the recovery room  postoperatively.                                               Henry A. Pool, M.D.    HAP/MEDQ  D:  08/06/2003  T:  08/06/2003  Job:  [id number]

## 2011-03-05 NOTE — Procedures (Signed)
Kendra [name], Kendra [name] NO.:  [id number]   MEDICAL RECORD NO.:  [id number]          PATIENT TYPE:  REC   LOCATION:  TPC                          FACILITY:  [facility]   PHYSICIAN:  Ranelle Oyster, M.D.DATE OF BIRTH:  [birth year]   DATE OF PROCEDURE:  01/03/2006  DATE OF DISCHARGE:                                 OPERATIVE REPORT   MEDICAL RECORD NUMBER:  [id number]   DATE OF BIRTH:  December 07, 1960   PROCEDURE:  Synvisc injection, diagnostic code 58.91.   ATTENDING:  Ranelle Oyster, M.D.   DESCRIPTION OF PROCEDURE:  After informed consent and preparation with  isopropyl alcohol, we aspirated and then injected the left shoulder via the  lateral approach using a 22-gauge 1-1/2-inch needle.  We injected 5 mL of  aqueous phenol solution into the left shoulder.  The patient had no  difficulties with the injection.  She was given post-injection instructions  today.   This is the second of 3 injections.   PLAN:  1.  We will do the last injection in 2 weeks' time.  2.  We will schedule Klonopin 0.25 mg q.8 h. for her tremors and dyskinesias      to see how this benefits her.      Ranelle Oyster, M.D.  Electronically Signed     ZTS/MEDQ  D:  01/03/2006 12:38:57  T:  01/04/2006 10:58:19  Job:  [id number]

## 2011-03-05 NOTE — Assessment & Plan Note (Signed)
Wednesday, September 21, 2006:   Kendra [name] is back regarding her dyskinesias and left shoulder pain in the  setting of a brain injury. She has had some more low back pain, as of  late, particularly in the right low back and hip. There is not a lot of  radiation down the leg; although she indicated it on her health and  history sheet, she did not indicate this on verbal questioning. Shoulder  is doing a bit better, although she is still having problems with  dyskinesias there. Klonopin seems to have done generally well on  controlling these, however. She sleeps fairly well with the Sinemet as  well at night time. The patient rates her pain as a 6 out of 10.  Describes it as sharp, stabbing, tingling and aching. The pain  interferes with general activity, relations with others, and enjoyment  of life on a moderate level. Sleep is fair.   REVIEW OF SYSTEMS:  Positive for the above, plus depression and anxiety.  Full review is in the health and history section.   SOCIAL HISTORY:  The patient is single, living with her parents.   PHYSICAL EXAMINATION:  Blood pressure is 134/73, pulse is 76,  respiratory rate 16, she is satting 93% on room air.  The patient is pleasant, in no acute distress. She is oriented x3.  Lungs are clear.  HEART:  Regular.  ABDOMEN:  Soft and nontender.  The patient remains off balance with her cane and walks with rigidity on  the left side and often instability. She has difficulty clearing the  left foot with gait. She has less tone at the elbow and shoulder. Some  crepitus is appreciated at the shoulder with movement. Reflexes remain  2+ to 3+ throughout on the left side. Right hip and low back were  painful to palpation today. They hurt more with extension maneuvers and  improved with flexion.  Weight remains stable. The patient is at severe risk of falling due to  her spastic left-sided weakness. Weight does not help either.   ASSESSMENT:  1. Status post TBI with  chronic spastic left hemiparesis and      dyskinesias.  2. 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  3. Seizure disorder.   PLAN:  1. I have really exhausted the treatments to improve her spasticity      and pain control. She is unable to tolerate substantial narcotic      medications. Today we discussed quality of life issues. I really      think that she would do well with a scooter and to increase her      activity outside of her home. The patient's mother and the patient      herself will contact a scooter company and discuss the procedure      there. I would be happy to advocate and complete any paperwork as      needed.  2. Continue Sinemet and Klonopin for movement disorder.  3. Keppra 500 mg 3 times a day for seizure.  4. I will see the patient back in about four months time.     Ranelle Oyster, M.D.  Electronically Signed    ZTS/MedQ  D:  09/21/2006 10:10:40  T:  09/21/2006 15:02:51  Job #:  [id number]

## 2011-03-05 NOTE — Op Note (Signed)
NAMEELLAR, HAKALA                            ACCOUNT NO.:  [id number]   MEDICAL RECORD NO.:  [id number]                   PATIENT TYPE:  INP   LOCATION:  3172                                 FACILITY:  [facility]   PHYSICIAN:  Kathaleen Maser. Pool, M.D.                 DATE OF BIRTH:  12-Aug-1961   DATE OF PROCEDURE:  07/04/2002  DATE OF DISCHARGE:                                 OPERATIVE REPORT   PREOPERATIVE DIAGNOSES:  L4-5 degenerative grade 1 spondylolisthesis with  severe stenosis and L5-S1 degenerative disk disease with foraminal stenosis.   POSTOPERATIVE DIAGNOSES:  L4-5 degenerative grade 1 spondylolisthesis with  severe stenosis and L5-S1 degenerative disk disease with foraminal stenosis.   OPERATION PERFORMED:  L4-5 and L5-S1 decompressive laminectomies and  foraminotomies followed by posterior lumbar interbody fusion utilizing  tangent wedges and local autograft coupled with posterolateral fusion from  L4 to S1 utilizing segmental pedicle screw instrumentation and local  autograft.   SURGEON:  Kathaleen Maser. Pool, M.D.   ASSISTANT:  Donzetta Sprung. Wynetta Emery, M.D.   ANESTHESIA:  General endotracheal.   INDICATIONS FOR PROCEDURE:  The patient is a 50 year old female who has a  history of traumatic brain injury at age 44.  This has left her with  significant cognitive, motor and sensory difficulties.  The patient has a  spastic left-sided hemiparesis.  She had previously been ambulatory with an  abnormal gait but progressively, she has become nonambulatory secondary to  severe back and bilateral lower extremity pain much greater on the right  side.  The patient has failed conservative management.  MRI scanning  demonstrates evidence of degenerative spondylolisthesis at L4-5 with marked  foraminal stenosis and severe degenerative disk disease at L5-S1 with marked  foraminal stenosis.  The patient has been counseled as to her options.  She  has decided to proceed with a two level lumbar  decompression and fusion  procedure in hopes of improving her symptoms.   DESCRIPTION OF PROCEDURE:  The patient was taken to the operating room and  placed on the table in the supine position.  After an adequate level of  anesthesia was achieved, the patient was positioned prone onto a Wilson  frame and appropriately padded.  The patient's lumbar region is prepped and  draped sterilely.  A 10 blade was used to make linear incisions extending  from L3 down to the sacrum.  This was carried down sharply in the midline.  A subperiosteal dissection was performed exposing the lamina and facet  joints of L4, L5 and S1 as well as the transverse processes of L4, L5 and  the sacral ala bilaterally.  Deep self-retaining retractor was placed and  intraoperative fluoroscopy was used and the level was confirmed.  Decompressive laminectomy was then performed using Leksell rongeurs,  Kerrison rongeurs and a high speed drill to remove the entire lamina of L5  and L4  as well as the inferior facet joints of L4 and L5 and the superior  facet joints of L5 and S1.  All bone was cleaned and used in later  autografting.  The ligamentum flavum was then elevated and resected in a  piecemeal fashion using Kerrison rongeurs.  The underlying thecal sac and  exiting L4, L5 and S1 nerve roots were identified and widely decompressed.  Epidural venous plexus was coagulated and cut.  Starting first at L4-5  thecal sac and nerve roots were protected.  The disk space was incised with  a 15 blade on the right side.  A wide disk space cleanout was achieved.  The  procedure was then repeated on the contralateral side and then repeated  bilaterally at L5-S1.  After a very thorough diskectomy was performed,  preparation was then made for interbody fusion.  Starting first at L4-5, a  distractor was placed and the disk space was dilated up to 10 mm.  The  distractor was left in place and attention then placed to the  contralateral  side. Once again with the nerve roots protected the disk space was then  reamed and then cut with a 10 mm chisel.  Soft tissue was removed and a 10 x  26 mm tangent wedge was impacted into place and recessed approximately 1 mm  from the posterior cortical margin.  Distractor was removed from the  contralateral side.  The thecal sac and nerve roots were once again  protected on this side.  The disk space was then reamed and then cut with a  10 mm tangent chisel.  The soft tissue was removed.  Disk space was  curettaged, morselized autograft was packed into the interspace and then a  second 10 x 26 mm tangent wedge was then impacted into place and recessed  approximately 2 mm posterior cortical margin.  The procedure was then  repeated at L5-S1 again without complication.  8 mm x 26 mm tangent wedges  were used bilaterally and local autograft was once again used between the  wedges.  The pedicles at L4, L5 and S1 were then isolated using surface  landmarks.  Superficial bone was removed over the pedicles using high speed  drill.  Each pedicle was then probed using a pedicle awl.  Each pedicle awl  tract was found to be solidly within bone.  Each pedicle awl tract was then  tapped with 5.25 mm screw tap.  Each screw tap hole was found to be solidly  within bone.  6.75 x 40 mm spiral 90 screws were placed bilaterally at L4.  6.75 x 35 mm screws were placed bilaterally at L5 and S1.  All six screws  were found to be well within the bone and solid.  Transverse processes and  sacral ala were then decorticated using the high speed drill.  Morselized  autograft was packed posterolaterally for later fusion.  A short segment of  titanium rod was then contoured and placed over the screw heads at L4, L5  and S1 bilaterally.  Locking caps were then placed over the screw heads.  Locking caps were then engaged in a sequential fashion with the construct under compression.  Final tightening  was achieved.  Final images revealed  good position of bone grafts and hardware at the proper operative level with  normal alignment of the spine.  The wound was then irrigated with antibiotic  solution.  Gelfoam was placed topically for hemostasis and found to be good.  A medium Hemovac drain was left in the epidural space.  The wound was then  closed in layers with Vicryl sutures.  Steri-Strips and sterile dressings  were applied.  There were no apparent complications.  The patient tolerated  the procedure well and returned to the recovery room postoperatively.                                                 Henry A. Pool, M.D.    HAP/MEDQ  D:  07/04/2002  T:  07/04/2002  Job:  [id number]

## 2011-03-05 NOTE — Procedures (Signed)
[name], BEASTON NO.:  [id number]   MEDICAL RECORD NO.:  [id number]          PATIENT TYPE:  REC   LOCATION:  TPC                          FACILITY:  [facility]   PHYSICIAN:  Ranelle Oyster, M.D.DATE OF BIRTH:  01/23/1961   DATE OF PROCEDURE:  06/01/2005  DATE OF DISCHARGE:                                 OPERATIVE REPORT   MEDICAL RECORD NUMBER:  [id number]   DATE OF PROCEDURE:  June 01, 2005   [name] is here for a Botox injection of the left triceps due to her ongoing  tightness of the shoulder and elbow, particularly in extension, although she  has some fluctuating movements. After informed consent and with EMG  guidance, we injected the left triceps at all three heads using 300 units  total of botulinum toxin A. Each 100 units was diluted in 1.5 mL of  preservative-free normal saline. The patient tolerated the injections well.  Aspiration technique was used.   Incidentally, we discussed increasing leg pain, particularly in the left  side, originating from the low back running to the feet. Considering the  patient's history of back problems and gait disorder, we will have an MRI of  the lumbar spine performed to follow-up new neurogenic compromise. I will  call the patient back with any positive results. Otherwise, I will see her  back in 1 month's time.      Ranelle Oyster, M.D.  Electronically Signed     ZTS/MEDQ  D:  06/01/2005 15:16:40  T:  06/01/2005 16:30:31  Job:  [id number]

## 2011-03-05 NOTE — Assessment & Plan Note (Signed)
DATE OF VISIT:  January 11, 2005.   MEDICAL RECORD NUMBER:  [id number].   Kendra [name] is back regarding her brain injury and left-sided movement disorder.  We injected her left gastrocnemius muscle and left posterior tibial muscle  with 100 units and 200 units of Botox, respectively.  The patient had fair  results with this.  She has had less down toeing in the foot.  The left  shoulder had temporary results with the steroid injection.  However, pain  has returned.  She complains of swelling and more so of pain now in the left  leg.  She tries to elevate the leg but continues to have some problems.  Her  family physician gave her Lasix to use, but she does not like to try this  due to fatigue.  The patient also did not tolerate the change to Effexor and  in fact the day she started the Effexor had significant spasms and tremor.  They went back on with the Keppra and she did much better.  She just does  not have a lot of energy when taking the Keppra.  She rates her pain at a  7/10 on average.  The pain is stabbing and aching in quality.  It improves  with heat and medications.  Worse with walking, bending and standing.  She  continues to use her double upright AFO for ankle control with T strap.   SOCIAL HISTORY:  Unchanged.   REVIEW OF SYSTEMS:  The patient denies any new symptoms other than those  mentioned above.  Full review of systems is in the health and history  section of the chart.   PHYSICAL EXAMINATION:  VITAL SIGNS:  The blood pressure is 116/78, the pulse  is 60 and the respiratory rate is 16.  She is saturating 99% on room air.  GENERAL APPEARANCE:  The patient is alert and appropriate.  Affect is  bright.  Gait was steady with the left leg.  EXTREMITIES:  She had less dyskinesias of the left leg and arm today.  The  left ankle had good passive movement.  There was occasional catch toe with  plantar flexion.  __________ was noted at the left leg at 1+.  Motor and  sensory  function are stable.  The left upper extremity revealed some  crepitus at the shoulder.  She had some dystonia, but for the most part  controlled.  The left shoulder was painful with impingement maneuver and  with general passive movement in most directions.  HEART:  Regular rate and rhythm.  CHEST:  Clear.  ABDOMEN:  Soft and nontender.  The patient remains slightly obese.   ASSESSMENT:  1.  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  2.  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  3.  History of lumbar degenerative disk disease and diskectomy.  4.  Greater trochanteric bursitis, left greater than right.   PLAN:  1.  The patient is doing fairly well with the Keppra.  She will have to      tolerate some of the fatigue-related side effects, but overall she      appears bright and appropriate today.  2.  Would like to stop her Relafen, which she has been on for some time.      She may use Celebrex 200 mg daily on a p.r.n. basis only.  This should      help some of her swelling in the left leg.  3.  Recommend  elevation and loosening of the AFO when sitting.  She may      benefit from compressive stockings as well.  4.  Continue with active at home as tolerated.  5.  Will give her a trial of diclofenac cream 3% to use on the left      shoulders and knees.  She may apply this twice daily.  6.  I will see Mariadelcarmen back in about three months' time.  Would like to check      comprehensive metabolic panel and CBC today.      ZTS/MedQ  D:  01/11/2005 12:13:36  T:  01/11/2005 14:09:31  Job #:  [id number]

## 2011-03-05 NOTE — Assessment & Plan Note (Signed)
FOLLOWUP:  Kendra [name] is back regarding her traumatic brain injury and chronic  left-sided dyskinesia and dystonia.  She reports some increasing left  shoulder pain once again.  She has had continued movements of the left  arm that are random and involve the shoulder and elbow extension/flexion  musculature.  She does not report any rhyme or reason to these.  Klonopin seems to help a little bit but she is only taking 0.25 mg 2 to  3 times a day.  We have had problems getting other medications on board  due to side effects.  Botox has worked to a certain extent but only for  a few weeks at a time.  She does fairly well with Sinemet at bedtime for  sleep and movement.  Patient rates her pain 8/10 and describes it as  grabbing, turning, aching and constant.  The pain interferes with  general activity, relations with others and enjoyment of life on a  moderate to severe level.  Sleep is poor.   REVIEW OF SYSTEMS:  Positive for trouble walking, anxiety.  She talks  about needing new inserts for her shoes as well.   SOCIAL HISTORY:  Patient is single and living with her parents.   PHYSICAL EXAMINATION:   VITAL SIGNS:  Blood pressure 114/74, pulse 68, respiratory rate 16.  Saturation 95% on room air.  GENERAL:  The patient is pleasant, alert and oriented x3.  Affect is  generally bright and appropriate.  HEART:  Regular.  CHEST:  Clear.  ABDOMEN:  Soft, nontender.  EXTREMITIES:  Patient remains with intermittent tone in the left upper  extremity that really varies upon attention to the left side and other  stimuli.  Often the arm is in abduction with extension at the elbow that  will quickly vary to flexion at the elbow and adduction at the shoulder.  She has some crepitus at the shoulder with passive movement.  Rotator  cuff signs are positive.  Reflexes remain 2+ and 3+ throughout the left  side today.  The left lower extremity is stable with continued weakness  and dystonia to a lesser  extent.  Weight remains unchanged.  No signs of  skin breakdown are seen.   ASSESSMENT:  1. 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  2. 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  3. Seizure disorder.   PLAN:  1. We will see if we can push her Klonopin up further to 1 mg twice      daily and perhaps three times daily.  She will stay with the      Sinemet at nighttime as well.  2. Continue Keppra 500 mg three times daily for seizure prophylaxis.  3. Consider referral to movement disorder clinic at [facility]      as I am not sure what else to offer her at this point.  We have      tried a myriad of medications up until now.      Kendra [name], M.D.  Electronically Signed     ZTS/MedQ  D:  02/06/2007 14:08:11  T:  02/06/2007 15:37:30  Job #:  [id number]

## 2011-03-05 NOTE — Discharge Summary (Signed)
NAME:  Kendra [name], Kendra [name]                          ACCOUNT NO.:  [id number]   MEDICAL RECORD NO.:  [id number]                   PATIENT TYPE:  IPS   LOCATION:  4011                                 FACILITY:  [facility]   PHYSICIAN:  Ellwood Dense, M.D.                DATE OF BIRTH:  10-31-1960   DATE OF ADMISSION:  08/09/2003  DATE OF DISCHARGE:  08/22/2003                                 DISCHARGE SUMMARY   DISCHARGE DIAGNOSES:  1. Lumbar laminectomy with interbody fusion, August 06, 2003.  2. Pain management.  3. History of traumatic brain injury at age 50 with left-sided weakness.  4. Hypertension.  5. History of lumbar laminectomy in October of 2003.   HISTORY OF PRESENT ILLNESS:  This is a 50 year old white female with a  history of traumatic brain injury since childhood with resultant left  hemiparesis, lumbar laminectomy in October of 2003, for which he did receive  inpatient rehab services.  Patient now admitted, August 06, 2003, with  acute-onset low back pain, x-rays and imaging with large paracentral disk  herniation, L3-4.  Underwent re-exploration of previous laminectomy,  complete L3 laminectomy and foraminotomies, open reduction, superior  fracture, L4, L3-4 posterior interbody fusion, August 06, 2003, per Dr.  Kathaleen Maser. Pool.  Postoperative pain management.  Advised back corset when out  of bed.  She was minimal-assist for mobility, admitted for a comprehensive  rehab program.   PAST MEDICAL HISTORY:  See discharge diagnoses.   PAST SURGICAL HISTORY:  1. Cholecystectomy.  2. Lumbar laminectomy.   ALLERGIES:  Rogelia Mire and DARVOCET.   SOCIAL HISTORY:  Denies alcohol or tobacco.   MEDICATIONS PRIOR TO ADMISSION:  Toprol, Klonopin, Benicar, Soma, Celebrex,  quinine sulfate, Mobic, Zyrtec, Protonix and a multivitamin.   SOCIAL HISTORY:  Lives with parents in [place], used a quad cane and a left  AFO brace prior to admission, one-level home, two steps to entry,  parents'  assistance as needed.   HOSPITAL COURSE:  The patient did well while in rehabilitation services,  with therapies initiated on a b.i.d. basis.  The following issues were  followed during patient's rehab course:  Pertaining to Ms. Baltes' lumbar  laminectomy, August 06, 2003, surgical site healing nicely, neurovascular  sensation remained intact.  She was advised a back corset when out of bed.  Pain control ongoing with the use of Vicodin and good results.  She was  maintained on subcutaneous Lovenox throughout her rehab stay for deep venous  thrombosis prophylaxis.  Noted history of traumatic brain injury since age 75  with resultant left-sided weakness.  Her left AFO brace was refitted per [employer].  Blood pressure was controlled with home regimen of Toprol and  Benicar.  She had no bowel or bladder disturbances.  She was ambulating  supervision with a large-base quad cane, needing some assistance for lower  body dressings.  Her  parents had been through full family teaching.  She  would be discharged to home with home health therapies.   Latest labs showed a hemoglobin of 10.2, hematocrit 29.6, WBC 8.1, platelets  of 448,000; sodium 138, potassium 3.8, BUN 9, creatinine 0.7.   DISCHARGE MEDICATIONS:  1. Klonopin 1 mg at bedtime.  2. Benicar 20 mg daily.  3. Zyrtec daily.  4. Toprol-XL 25 mg daily.  5. Protonix 40 mg daily.  6. Multivitamin daily.  7. Vicodin as needed -- pain.  8. Soma every six hours as needed -- spasms.  9. Quinine sulfate 325 mg every eight hours as needed.   ACTIVITY:  Back corset when out of bed.   DIET:  Diet was regular.   SPECIAL INSTRUCTIONS:  Home health physical and occupational therapy.   FOLLOWUP:  She should follow up with Dr. Jordan Likes, neurosurgery, call for  appointment.      Mariam Dollar, P.A.                     Ellwood Dense, M.D.    DA/MEDQ  D:  08/21/2003  T:  08/22/2003  Job:  [id number]   cc:   Ellwood Dense, M.D.   [address] [place]  [place] [address]  Fax: [phone]   Kathaleen Maser. Pool, M.D.  [address]  [place]  [place] [address]  Fax: [phone]   [facility], [place], [place] Dr. Nedra Hai

## 2011-03-05 NOTE — Procedures (Signed)
Kendra [name], Kendra [name] NO.:  [id number]   MEDICAL RECORD NO.:  [id number]          PATIENT TYPE:  REC   LOCATION:  TPC                          FACILITY:  [facility]   PHYSICIAN:  Ranelle Oyster, M.D.DATE OF BIRTH:  [birth year]   DATE OF PROCEDURE:  DATE OF DISCHARGE:                                 OPERATIVE REPORT   PROCEDURE:  Synvisc injection.   DIAGNOSIS:  Osteoarthritis of the left shoulder, ICD9 code 715.91.   DESCRIPTION OF PROCEDURE:  After informed consent and preparation of the  skin with isopropyl alcohol, we aspirated, then injected the left shoulder  via the posterior approach with 2 cc of Synvisc solution.  The patient had  no difficulties tolerating the procedure.  Patient has done well so far with  her left shoulder discomfort with less grinding and pain noted.  This is the  third of three injections today.  I will see her back in three months time  for regular followup.  Patient had no questions or problems on leaving the  office today.      Ranelle Oyster, M.D.  Electronically Signed     ZTS/MEDQ  D:  06/29/2006 10:25:53  T:  06/29/2006 09:81:19  Job:  [id number]

## 2011-03-05 NOTE — Assessment & Plan Note (Signed)
Kendra [name] is back regarding her brain injury and left-sided spasticity and  dyskinesias.  We had attempted a trial of propranolol last visit.  She  became more lightheaded and dizzy.  She was not sure if it really helped  with any of the movements.  She saw [employer] for adjustment of her AFO with  new pins placed and the new outsole placed to level her out a bit.  She  seems to have had some results with this from the standpoint of her  stability of the ankle.  The patient continues to complain of shoulder pain  and tightness in her trapezius and biceps musculature at times in the left  shoulder.  She describes her pain as a level 8/10 currently, and the pain is  described as burning, dull and constant aching.  The patient is walking with  a cane currently at home.  She tends to be a bit impulsive with her  activities.   REVIEW OF SYSTEMS:  Positive for weakness, tremor, trouble walking, spasms,  dizziness and anxiety.  Denies any constitutional, GU, GI or  cardiorespiratory complaints.   SOCIAL HISTORY:  The patient's mother remains supportive.   PHYSICAL EXAMINATION:  VITAL SIGNS:  Blood pressure is 127/78, pulse is 80,  respiratory rate 16.  She is saturating 94% in room air.  GENERAL:  The patient is in no acute distress.  She is alert and oriented x3  and affect is bright and appropriate.  MUSCULOSKELETAL/NEUROLOGIC:  I examined her gait today, and she was much  more level with her gait.  She is still dyskinetic with her gait, but her  posture and form are improved.  I think she was more stable today.  She has  difficulty harnessing her movements in a coordinated fashion for the most  part, and this includes the left upper extremity as well.  Intermittent  tightness was noted today along the left biceps and triceps at a level of  2/4 on the Ashworth scale.  Cervical paraspinals and trapezius were somewhat  tight today as well.  She had crepitus in the left shoulder still today.  CARDIAC:  Heart was regular rate and rhythm.  CHEST:  Lungs were clear.  ABDOMEN:  Soft, nontender.   ASSESSMENT:  1.  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  2.  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  3.  History of lumbar degenerative disk disease with diskectomy.  4.  History of bilateral greater trochanteric bursitis.  5.  Myofascial pain secondary to #1.   PLAN:  1.  Will stop propranolol as it this has been ineffective and probably has      caused her some hypotension.  2.  Continue Keppra at a current dose.  3.  Will set the patient up for Botox injections of the left biceps and      triceps once again using 300 units of botulinum toxin A.  4.  Recommend using Celebrex only as needed for severe pain.  This may help      with her swelling in the lower extremities to a certain extent.  5.  The patient may use Lidoderm patches for local pain.  6.  Will see her back at her scheduled visit for a Botox injection.     ZTS/MedQ  D:  04/14/2005 13:21:06  T:  04/14/2005 13:55:48  Job #:  [id number]

## 2011-03-05 NOTE — Procedures (Signed)
Kendra [name], Kendra [name]                ACCOUNT NO.:  [id number]   MEDICAL RECORD NO.:  [id number]          PATIENT TYPE:  REC   LOCATION:  TPC                          FACILITY:  [facility]   PHYSICIAN:  Ranelle Oyster, M.D.DATE OF BIRTH:  27-Nov-1960   DATE OF PROCEDURE:  11/30/2004  DATE OF DISCHARGE:                                 OPERATIVE REPORT   PROCEDURE:  Botox injection.   ICD-9 CLASSIFICATION:  342.12   DESCRIPTION OF PROCEDURE:  After informed consent and appropriate  preparation of the skin, we injected the left tibialis posterior using 200  units of botulinum toxin and the left gastrocnemius muscle using 100 units  botulinum toxin using EMG guidance. We utilized a 26 gauge 50 mm injectable  monopolar needle to localize the muscles and then inject said muscles today.  The patient tolerated the injection today without any complications.  Aspiration technique was applied.   Additionally, we injected the left shoulder today for her degenerative left  shoulder disease and subsequent pain (ICD 715.91).  Aspiration technique was  used and we injected the patient with 1 1/2 inch 25 gauge needle through the  lateral approach using 40 mg of Kenalog and 3 mL of 1% lidocaine.  The  patient tolerated this well without issue.   We switched the patient from Keppra to Effexor today to better treat  neuropathic pain.  She feels that the Keppra was making her overly sleepy.  Will place her on 75 mg of Effexor XR p.o. q.d. Will discuss other  medications for dyskinesic movements at next visit.   I will see the patient back in about six weeks time.      ZTS/MEDQ  D:  11/30/2004 11:18:32  T:  11/30/2004 11:39:44  Job:  [id number]

## 2011-03-05 NOTE — Discharge Summary (Signed)
Kendra [name], Kendra [name]                            ACCOUNT NO.:  [id number]   MEDICAL RECORD NO.:  [id number]                   PATIENT TYPE:  IPS   LOCATION:  4001                                 FACILITY:  [facility]   PHYSICIAN:  Ranelle Oyster, M.D.             DATE OF BIRTH:  04-Aug-1961   DATE OF ADMISSION:  07/06/2002  DATE OF DISCHARGE:  07/26/2002                                 DISCHARGE SUMMARY   DISCHARGE DIAGNOSES:  1. Posterior lumbar interbody fusion L4-5 and L5-S1 with fusion fromS1.  2. Spasticity bilateral lower extremities.  3. Hypertension.  4. Urinary retention, resolved.  5. Postoperative anemia, resolved.   HISTORY OF PRESENT ILLNESS:  The patient is a 50 year old female with  history of hypertension, brain injury, spastic left hemiparesis, past pain  for two to three years significant for spondylolisthesis with stenosis.  She  elected to undergo L4-5 and L5-S1 posterior lumbar interbody fusion and  fusion of S1 by Dr. Jordan Likes on 07/04/2002.  Postop has had improvement in right  lower extremity motor function.  Pain control has been and continues to be  an issue and limiting factor in therapy.  She was also noted to ahve  problems with bowel and bladder.  Physical therapy initiated, and patient is  noted to be moderate assistance for transfers, 2+ total assist.  The patient  has 75% ambulating 35 feet.   PAST MEDICAL HISTORY:  1. Hypertension.  2. Brain injury at age 58.  3. Spasticity at age 10.  4. Cholecystectomy.  5. Insomnia.  6. Frequency.  7. Irritable bowel syndrome.  8. Peripheral edema.   ALLERGIES:  Questionable allergy to PERCOCET.  DARVOCET and  BIAXIN cause  nervousness and hyperactivity.  Questionable allergy to NEURONTIN.   SOCIAL HISTORY:  The patient lives with parents in one-level home with two  steps to entry.  Independent with a cane until two weeks prior to admission.  She does not use any tobacco or alcohol.  Parents can provide  assistance as  needed past discharge.   HOSPITAL COURSE:  The patient was admitted to rehabilitation on 07/06/2002  for inpatient therapy to consist of PT and OT daily.  At time of admission,  the patient was reported to have increase in back pain, and OxyContin was  increased to 20 mg b.i.d.  Ther was also question of orthostasis, and  patient's blood pressure medicines were held initially.  She was noted to  have problems voiding and requiring in and out catheterization initially.  She was started on Flomax with improvement in symptomatology.  Urine C&S was  sent off and was noted to be negative, showing no growth.  The patient's  back incision healed well without any signs or symptoms of infection, no  drainage or erythema noted.   Initially the patient had complaints regarding back pain as well as  exacerbation of spasticity in lower extremities  secondary to back surgery.  Attempts were made to keep at comfortable with use of Neurontin as well as  addition of Arthrotec.  The patient was unable to tolerate Neurontin with  complaints of dizziness and orthostasis.  She was also unable to tolerate  Baclofen secondary to lethargy.  Medication regimen was simplified. She was  started on Topimax 25 and he OxyContin was decreased to once a day, and this  has continued to provide reasonable pain control.  Followup labs have shown  resolution of postop anemia with CBC revealing hemoglobin 12.3, hematocrit  36.6, white count 9.7, platelets 439.  Check of electrolytes showed sodium  139, potassium 3.8, chloride 106, CO2 25,BUN 10, creatinine 0.8, glucose  108.   The patient has had some left lower extremity weakness and hinged and double  upright arthrosis was ordered to help with buckling of knees and help assist  with dorsiflexion for ambulation.  The patient has done well with use of  this.   By the time of discharge, the patient has made steady progress.  Safety  awareness has improved,  and she able to participate in sessions without any  encouragement. She is able to roll from left to right in bed with some  supervision.  She does continue to requiring cues to follow back precautions  when in bed.  She is currently at supervision to minimum assistance for  transfers.  She is able to ambulate and control level and un level surfaces,  large base quad cane at 30 feet x 3 with supervision to minimum assistance.  She requires cuing to slow down for increased safety and to have decreased  chance of falls.  She does ambulate with valgus forward trunk posture and  left upper extremity is known to be spastic to extension.  She is able to  navigate one step with close supervision.  In terms of ADLs, she requires  assistance for set up, for bathing, supervision to minimum assistance for  dressing.  She is supervision for toileting and Hygiene.  The patient will  continue to receive supervision and assistance by parents past discharge.  Followup PT/OT has been set up at [facility] to begin 07/31/2002.  On 07/26/2002, the patient is  discharged to home.   DISCHARGE MEDICATIONS:  1. Zovia 1 p.o. per day.  2. Toprol XL 50 mg a day.  3. Benicar 20 mg a day.  4. OxyContin 10 mg p.o. q.d. x 7 days, the discontinue.  5. Vicodin 1 to 2 p.o. q.4-6h. p.r.n. pain.  6. Soma 350 mg p.o. q.i.d. p.r.n. spasm.  7. Topamax 25 mg p.o. per day.   ACTIVITY:  Supervision. Continue to use corset.  Routine back precautions.   DIET:  Regular.   WOUND CARE:  Keep area clean and dry.   SPECIAL INSTRUCTIONS:  No alcohol or smoking or driving.    FOLLOW UP:  PT/OT at [facility]  starting 07/31/2002 at 1 to 3 p.m.  The patient is to follow up with Dr. Nedra Hai  and Dr. Jordan Likes in the next few weeks.  Follow up with Dr. Riley Kill in one month  for evaluation of spasticity.    Dian Situ, P.A.          Ranelle Oyster, M.D.    PP/MEDQ  D:  07/31/2002  T:  08/01/2002  Job:  [id number]   cc:   Dr. Dorann Lodge A. Pool,  M.D.  [address]  [place]  [place] [address]  Fax: [phone]

## 2011-03-05 NOTE — Assessment & Plan Note (Signed)
Kendra [name] is back regarding her spastic left hemiparesis and traumatic brain  injury.  She is in PT now and therapist is requesting a via ankle-foot  orthosis.  I have not received a request however.  Her pain is 4-7/10.  Kendra [name] states that she just wants to walk again.  She had a lot of pain in  her knee and ankle still.   REVIEW OF SYSTEMS:  Notable for the above.  Full 14-point review is in  the written health and history section of the chart.   SOCIAL HISTORY:  The patient is single, living with her mother.   PHYSICAL EXAMINATION:  VITAL SIGNS:  Blood pressure is 125/71, pulse is  62, respiratory rate 16, she is sating 96% on room air.  GENERAL:  The patient is pleasant, alert and oriented x3.  EXTREMITIES:  She continues to have dystonia particularly in the left  upper extremity.  There are varus deformities at the left ankle and  knee.  She has the T-strap on her AFO to help control varus.  She  continues to have plantar flexion in the foot as well.  Cognition is at  baseline.  HEART:  Regular.  CHEST:  Clear.  ABDOMEN:  Soft, nontender.   ASSESSMENT:  1. Traumatic brain injury with persistent dyskinesias on the left side      with left-sided weakness as well.  2. Degenerative joint disease of left shoulder, knee and ankle.  3. Seizure disorder.  4. Obesity.   PLAN:  1. Again, I stressed realistic expectations with her left lower      extremity.  I guess a KAFO would be beneficial to her I believe,      but she would not be an every day walker.  The best hope would be      for her to do some therapy, get exercise with her family at home to      maintain muscle tone.  KAFO is not likely to help a great deal of      transfers either.  The patient will go to [facility] for fitting of a      KAFO and continue with therapy to work on better gait.  2. Continue power wheelchair use for primary means of mobility at      home.  3. No other med changes were made today.  4. I will see her  back in 6 months.      Ranelle Oyster, M.D.  Electronically Signed     ZTS/MedQ  D:  07/04/2009 11:36:03  T:  07/05/2009 02:21:14  Job #:  [id number]

## 2011-03-05 NOTE — Procedures (Signed)
Kendra [name], Kendra [name] NO.:  [id number]   MEDICAL RECORD NO.:  [id number]          PATIENT TYPE:  REC   LOCATION:  TPC                          FACILITY:  [facility]   PHYSICIAN:  Ranelle Oyster, M.D.DATE OF BIRTH:  [birth year]   DATE OF PROCEDURE:  07/28/2004  DATE OF DISCHARGE:                                 OPERATIVE REPORT   PROCEDURE:  Botox injection.   ICD-9 code for this diagnosis is 342.12.   DESCRIPTION OF PROCEDURE:  After informed consent and appropriate  preparation, we injected the left biceps at medial and lateral heads each  with 100 units of botulinum toxin type A.  Aspiration technique was used.  No complications were experienced.  The area was cleaned and covered.  In  the left calf we also used 200 units of botulinum toxin A divided into two  100-unit doses, each placed in gastrocnemius head as well as deeper into the  tibialis posterior musculature using EMG guidance.  Appropriate sterile  technique was used with aspiration prior to injection.  The patient  tolerated this well.   Post-injection instructions were given.  She will continue with exercises  and stretches at home.   The patient discussed possibly some increases in her absence seizures.  Would like to send her for an EEG to rule out any ongoing seizure activity.  She had not been on seizure prophylaxis when she came to me.  We had placed  her on Keppra for some of her dystonic movements, which has proved  beneficial.  She currently uses 1000-1500 mg a day based on her symptoms.   Will see the patient back in about two months' time.       ZTS/MEDQ  D:  07/28/2004 15:16:01  T:  07/29/2004 07:10:55  Job:  [id number]

## 2011-03-05 NOTE — Assessment & Plan Note (Signed)
MEDICAL RECORD NUMBER:  [id number].   Kendra [name] is back regarding her brain injury and left sided dyskinesias. She has  been fairly stable with left sided movement. Seems to wax and wane depending  on the timing of the Botox injections. The left foot has been bothering her  a bit more and seems to be intoeing. She has had problems with the double  upright AFO and a couple of the screws have been coming loose as well. She  complains of left sided neck pain, too, today. She describes her pain as  aching and tightness. Pain is worse in the day time and worse when the arm  moves around. Pain increases also with walking and standing and improves  with rest, heat, and ice. She has not used ice aggressively or heat.   REVIEW OF SYSTEMS:  The patient reports spasms and anxiety. Has had no new  seizures, cardiorespiratory, GI, or GU symptoms. No fever, chills, or  sweating.   SOCIAL HISTORY:  The patient continues to live with her mother and father  who are supportive.   PHYSICAL EXAMINATION:  VITAL SIGNS:  Blood pressure is 109/53, pulse 80,  respiratory rate 16. She is saturating 96% on room air.  GENERAL:  The patient is pleasant in no acute distress. She is alert and  oriented x3. Affect is bright and appropriate.   She continues to walk with a limp favoring the left side. She uses a cane  for stability. Left arm seems to continue to move in all directions with  purposeful movement of the right side. Particularly when she gets a bit  anxious, the arm will move. I did not see any dyskinesias over the left leg  although patient states that the left leg can mimic the left arm. There was  taut bands of muscle along the medial trapezius on the left side as well as  the cervical paraspinals at C7. These were tender to palpation and were  related to positioning of her left arm certainly. Left leg is stable from a  neurological standpoint.   HEART:  Regular rate and rhythm.  LUNGS:  Clear.  ABDOMEN:   Soft, nontender.   ASSESSMENT:  1.  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  2.  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  3.  History of lumbar degenerative disk disease and diskectomy.  4.  Greater trochanteric bursitis, left greater than right.  5.  Myofascial pain related to posture and to #1.   PLAN:  1.  Continue Keppra at current dose.  2.  Will introduce propranolol 20 mg q.h.s., increasing to b.i.d. over one      week's time. Her blood pressure should be able to accommodate this. She      is on Toprol already. We will watch heart rate closely.  3.  Will send patient to [facility] for adjustment of her AFO. She needs the      screw pins refastened. She also may need a new insole for her shoe.  4.  After informed consent, we injected the left trapezius and the cervical      paraspinals with 2 cc of 1% lidocaine. The patient tolerated these well.  5.  We reviewed patient's metabolic panel from last visit which was normal.  6.  Continue Lidoderm patches for local pain as well as Celebrex 200 mg q.d.  7.  Will see the patient back in about a month's time.  ZTS/MedQ  D:  03/05/2005 16:20:03  T:  03/06/2005 09:05:21  Job #:  [id number]

## 2011-03-05 NOTE — Procedures (Signed)
NAMEBRITTNEY, Kendra [name] NO.:  [id number]   MEDICAL RECORD NO.:  [id number]          PATIENT TYPE:  REC   LOCATION:  [facility]                          FACILITY:  [facility]   PHYSICIAN:  Ranelle Oyster, M.D.DATE OF BIRTH:  [birth year]   DATE OF PROCEDURE:  DATE OF DISCHARGE:                                 OPERATIVE REPORT   PROCEDURE:  Synvisc injection.  ICD-I code 715.91.  Osteoarthritis of the  left shoulder.   DESCRIPTION OF PROCEDURE:  After informed consent and preparation of the  skin with Betadine, we entered via the posterior approach and injected 2 cc  of aqueous Synvisc solution, after aspiration, into the subacromial space.  Patient tolerated well without complications.   Will perform the second of three injections 7-10 days.  I refilled Vicodin,  Klonopin and Sinemet today.      Ranelle Oyster, M.D.  Electronically Signed     ZTS/MEDQ  D:  06/13/2006 11:52:26  T:  06/14/2006 01:29:58  Job:  [id number]

## 2011-03-05 NOTE — Procedures (Signed)
NAME:  Kendra [name], SHADOWENS                          ACCOUNT NO.:  [id number]   MEDICAL RECORD NO.:  [id number]                   PATIENT TYPE:  REC   LOCATION:  [facility]                                  FACILITY:  [facility]   PHYSICIAN:  Ranelle Oyster, M.D.             DATE OF BIRTH:  06/16/1961   DATE OF PROCEDURE:  04/13/2004  DATE OF DISCHARGE:                                 OPERATIVE REPORT   MEDICAL RECORD NUMBER:  [id number]   PROCEDURE:  Phenol injection.   DESCRIPTION OF PROCEDURE:  After informed consent and appropriate sterile  preparation, the patient received a tibial nerve phenol injection at the  popliteal crease.  The nerve was located with surface electrode initially  and then the injectable needle was advanced using initial 80 milliamp  stimulation and then the amplitude was titrated down eventually to 0.5  milliamps with a positive plantar flexor response.  Aspiration technique was  utilized and then 5 mL of aqueous phenol was injected using a 5% solution.  The patient tolerated this well.  No adverse affects were noted.  Post  injection instructions were given.  I will follow-up with the patient in the  next one to two weeks' time for potential Synvisc injection to the right  knee.  The ICD-9 code for this procedure is 342.12.                                                Ranelle Oyster, M.D.    ZTS/MEDQ  D:  04/13/2004 12:02:56  T:  04/13/2004 13:14:44  Job:  [id number]

## 2011-03-05 NOTE — Assessment & Plan Note (Signed)
MEDICAL RECORD NUMBER:  [id number]   INTERVAL HISTORY:  Kendra [name] is back regarding her traumatic brain injury and  dyskinesias on the left.  She is doing a bit better after the Botox  injections to her triceps.  She is having less spontaneous movement however  the left shoulder does tighten up still and she has pain in the left  shoulder.  She received a shoulder injection at her family practice office a  week or two ago with minimal results.  We had an MRI done of her low back  which revealed essentially stable postoperative changes.  She did have a  moderate bulge and facet overgrowth and foraminal narrowing at the L2-L3  level just above the surgical site.  She is using one to two Ultram a day  for basic pain control.  The left shoulder is more bothersome than the back  at this point.  She still has some pain down the left leg to a lesser  extent.  Complains of some movement of her tightness in the shoulder over to  the neck and into the right arm.  She rates her pain at an 8 out of 10 today  and describes it as sharp, burning, stabbing, constant.  She states that  pain interferes with general activity, relations with others, enjoyment of  life on a significant level.  Sleep is poor.  She is dozing often during the  day, however.  Pain increases with walking, bending, standing and improves  with medication and Botox injections in the past.   REVIEW OF SYSTEMS:  Patient reports tremor, weakness, spasms, dizziness,  anxiety, night sweats, constipation, limb swelling particularly on the left  side.   SOCIAL HISTORY:  Patient lives with her parents who are helpful with care.  No other new issues are noted.   PHYSICAL EXAMINATION:  Blood pressure 113/69, pulse 75, respiratory rate 16,  she is saturating 95% in room air.  Patient is pleasant, no acute distress.  She is alert and oriented x3.  Affect is bright and appropriate.  Gait is  limping favoring the left side.  She has some generalized  edema in the left  arm and leg which is chronic for her.  Heart is regular rate and rhythm.  Lungs are clear.  Abdomen soft and nontender.  Left shoulder continues to  have crepitus.  She has some involuntary movement of the left shoulder into  extension but this is increased somewhat.  There is no focal spasm noted in  the biceps or trapezius or shoulder strap muscles today.  Rotator cuff may  have been a bit spastic but inconsistent.  She has fair neck range of motion  throughout.  Cognitively she is unchanged.  Reflexes are increased on the  left side at 2++ out of 4.  Low back exam is stable.  Straight leg testing  is equivocal.   ASSESSMENT:  1.  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      Deficits affect the left side, predominantly left upper extremity.  2.  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  3.  History of degenerative disk disease with discectomy and fusion of the      lumbar spine.  Patient with some ongoing low back pain.  4.  History of bilateral greater trochanter bursitis.  5.  Myofascial pain.   PLAN:  1.  Continue Keppra at current dose.  2.  We will begin long acting opiates, i.e., fentanyl patch  12 mcg.  Begin      one q.72 h. today.  She may use her Ultram 50-100 mg q.6 h. p.r.n. for      breakthrough pain.  She will probably need to reduce the breakthrough      meds until she becomes use to the fentanyl patch.  3.  Consider another agent for dyskinetic movements.  4.  Use Celebrex only as needed for pain.  5.  Continue Lidoderm patches for localized pain.  6.  Continue with range of motion and other modalities including heat and      ice at home.  Family discussed cervical MRI today.  I am not sure this      is indicated as the neck is not the cause of her left-sided problems.      It may      account for some of her neck discomfort but I am not convinced of that      at this point.  7.  I will see the patient  back in about 1 month's time.      Ranelle Oyster, M.D.  Electronically Signed     ZTS/MedQ  D:  07/07/2005 11:04:05  T:  07/07/2005 21:26:52  Job #:  [id number]

## 2011-03-05 NOTE — Procedures (Signed)
NAME:  Kendra [name], Kendra [name]                          ACCOUNT NO.:  [id number]   MEDICAL RECORD NO.:  [id number]                   PATIENT TYPE:   LOCATION:                                       FACILITY:  [facility]   PHYSICIAN:  Ranelle Oyster, M.D.             DATE OF BIRTH:  1961-02-16   DATE OF PROCEDURE:  05/01/2004  DATE OF DISCHARGE:                                 OPERATIVE REPORT   PROCEDURE:  Synvisc injection.   PHYSICIAN:  Ranelle Oyster, M.D.   DESCRIPTION OF PROCEDURE:  After an informed consent, and a sterile  preparation with Betadine, the patient was entered via the medial approach  into the patella using a #18 gauge 1.5 inch needle.  Aspiration was  attempted without positive results.  The syringe was removed and replaced  with 5 mL of Synvisc solution.  The area was injected without difficulty.  The patient tolerated this well.  The area was cleaned and covered post-  injection.  Post-injection instructions were given.  ICD code:  Infected knee disorder - #715.96.                                                Ranelle Oyster, M.D.    ZTS/MEDQ  D:  05/01/2004 11:26:55  T:  05/01/2004 15:57:38  Job:  [id number]

## 2011-03-05 NOTE — Procedures (Signed)
NAME:  Kendra [name], Kendra [name]                          ACCOUNT NO.:  [id number]   MEDICAL RECORD NO.:  [id number]                   PATIENT TYPE:  REC   LOCATION:  [facility]                                  FACILITY:  [facility]   PHYSICIAN:  Ranelle Oyster, M.D.             DATE OF BIRTH:  December 22, 1960   DATE OF PROCEDURE:  DATE OF DISCHARGE:                                 OPERATIVE REPORT   PROCEDURE:  Synvisc injection to right knee.   DESCRIPTION OF PROCEDURE:  After informed consent and appropriate sterile  preparation, we injected the right knee via the lateral approach with 5 mL  of prepared Synvisc solution.  This was a slightly traumatic injection due  to difficulty entering the infrapatellar space.  The patient generally  tolerated the procedure well. No adverse effects were noted.  Post injection  instructions were given. I did instruct the patient to place ice over the  knee several times today and over the weekend.  I will followup with the  patient in approximately two weeks for her next Synvisc injection.   We discussed palpation and physical therapy to address gait issues involving  the left leg and we will send her to [facility] therapy for this. Her  gait has changed somewhat since we did the tibial nerve phenol block.   ICD-9 CLASSIFICATION:  715.96.                                                Ranelle Oyster, M.D.    ZTS/MEDQ  D:  05/15/2004 13:02:23  T:  05/16/2004 09:21:37  Job:  [id number]

## 2011-03-05 NOTE — Assessment & Plan Note (Signed)
Kendra [name] is back regarding her dyskinesias and left shoulder pain.  She broke  the upright on her left AFO this week and had it repaired by [employer].  She  has experienced some more grinding in the left shoulder again that seemed to  be increasing since we did the Synvisc injection.  She had nice relief with  the Synvisc injections for about three months' time. Klonopin still seems to  be controlling her movement disorder.  She will have occasional jerks at  nighttime.  She is on Klonopin 0.25 mg four times.  She also is taking  Sinemet 25/100 twice daily.   The patient rates her pain today at 8 out of 10, described as stabbing,  aching.  Pain interferes with general activity in relations with others,  enjoyment of life on a moderate to severe level.  Sleep is fair.   REVIEW OF SYSTEMS:  The patient reports anxiety, spasms.  All other review  of systems as mentioned above in full review and health and history section.   SOCIAL HISTORY:  The patient is single admitted with her mother who remains  very supportive.   PHYSICAL EXAMINATION:  VITAL SIGNS:  Blood pressures 110/60, pulse is 82,  respiratory rate 16.  GENERAL:  Patient is pleasant in no acute distress.  HEART:  Regular rate and rhythm.  LUNGS:  Clear.  ABDOMEN:  Soft nontender.   She walks with her cane today and remained rigid in the left thigh with some  difficulty planting the left heel.  The AFO appears to be in working order.  Left arm has extensor tone at the elbow and internal rotation at the  shoulder.  There is grinding in the shoulder with abduction and rotation  today.  Reflexes remain increased at 2+ to 3+ on the left side.  Motor  function is generally 5/5 on the right upper extremity.  3+/5 on the left  upper extremity.  4 to 5 right lower extremity and 3+ to 4/5 on left lower  extremity.   ASSESSMENT:  1.  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  2.  Degenerative arthritis of the  left shoulder and knees.  3.  Seizure disorder.   PLAN:  1.  Continue clonidine 0.25 mg four times daily.  2.  Will restart Celebrex 200 mg daily as needed for break through pain.      The patient will stop ibuprofen and Relafen.  3.  Taper off  Sinemet.  4.  Patient to have Synvisc injections of her left shoulder.  5.  Continue Keppra 500 mg three times daily.  6.  I will see the patient back in approximately one or two weeks' time for      injection.      Ranelle Oyster, M.D.  Electronically Signed     ZTS/MedQ  D:  05/10/2006 13:41:15  T:  05/10/2006 14:20:53  Job #:  [id number]

## 2011-03-05 NOTE — Procedures (Signed)
NAMEKAMRI, GOTSCH NO.:  [id number]   MEDICAL RECORD NO.:  [id number]           PATIENT TYPE:   LOCATION:                                 FACILITY:   PHYSICIAN:  Ranelle Oyster, M.D.DATE OF BIRTH:  Jul 19, 1961   DATE OF PROCEDURE:  DATE OF DISCHARGE:                                 OPERATIVE REPORT   MEDICAL RECORD NUMBER:  [id number]   DATE OF BIRTH:  29-Nov-1960   PROCEDURE:  Synvisc injection, diagnostic code 84.91.   ATTENDING:  Ranelle Oyster, M.D.   DESCRIPTION OF PROCEDURE:  After informed consent and preparation with  isopropyl alcohol, we aspirated and injected the left shoulder via the  lateral approach using a 25-gauge 1-1/2-inch needle.  We injected  approximately 5 mL of aqueous phenol solution into the shoulder.  The  patient experienced no side-effects.   She will follow up for the second of 3 injections in approximately 2 weeks'  time.   Incidentally, we began p.r.n. Vicodin for breakthrough pain.  She will  continue on her other medicines as prescribed.      Ranelle Oyster, M.D.  Electronically Signed     ZTS/MEDQ  D:  12/21/2005 15:59:59  T:  12/22/2005 10:29:19  Job:  [id number]

## 2011-03-05 NOTE — Procedures (Signed)
CLINICAL HISTORY:  A 50 year old woman with history of traumatic brain  injury at age 41 and subsequent left __________ spasticity and staring  spells. EEG is performed for evaluation of possible seizures. The patient  describes awake and alert. This is a routine EEG done with photic  stimulation but not hyperventilation. The patient is presently on Keppra.   DESCRIPTION OF PROCEDURE:  The dominant rhythm in this tracing is a moderate  amplitude alpha rhythm of 10 to 11 hertz, which predominates posteriorly.  Appears without abnormal asymmetry and attenuates with eye opening and  closing. Low amplitude fast activity is seen frontally and centrally and  appears without abnormal asymmetry. Abundant muscle artifact is seen,  particularly over the right temporal area, intermittently over the left  temporal area. Slowing into the __________ hertz theta range is noted. On  about 8 to 10 occasions during the record, generalized bursts of high  amplitude 3 or 4 hertz frontal dominant slow waves are seen within at least  some of these __________ . Some small spike discharges and sharp wave  discharges are seen, which appear occasionally independent on each side but  generally bilaterally. No clinical change is noted during these episodes by  the tech on the tracing. Photic stimulation produced symmetric drive and  responses. Hyperventilation was not performed. Single channel devoted to EKG  revealed sinus rhythm throughout with a rate of approximately 78 beats per  minute.   CONCLUSION:  Abnormal study due to the presence of:  1.  Intermittent slowing of the left frontal temporal area. Finding      suggestive of underlying focal dysfunction.  2.  Intermittent appearance of generalized frontal dominant 3 to 4 hertz      slow waves with inter-mixed spike discharges, which most likely      represents epileptiform activity arising from an uncertain focus.      Correlation with examination and/or  imaging, if any is recommended.      WJX:BJYN  D:  07/29/2004 18:37:31  T:  07/30/2004 02:07:38  Job #:  [id number]

## 2011-03-05 NOTE — Assessment & Plan Note (Signed)
Kendra [name] is back regarding her pain syndrome and dyskinesias.  She has had some  return of left shoulder pain.  She has also noted increased tremors on the  left side and bumped her Sinemet up to 4 times a day.  She is also feeling  more sleepy.  She remains on Ultram ER 200 mg daily.  She is on Keppra 500  mg 3 times a day.  Pain is described as sharp, burning, stabbing,  interfering with daily activities, relationship with others, and enjoyment  of life on a moderate level.  Sleep is fair.  Symptoms usually worse with  activity, although sometimes at rest they are problematic as well.   REVIEW OF SYSTEMS:  Pertinent positives listed above, and full review in the  Health and History section.   SOCIAL HISTORY:  The patient is single, and parents are supportive.   PHYSICAL EXAMINATION:  VITAL SIGNS:  Blood pressure 108/76, pulse 96,  respiratory rate 16, O2 saturation 100% on room air.  GENERAL: The patient is pleasant and in no acute distress.  Alert and  oriented, although a bit more distractible than usual today. Affect is  bright.  Gait is fair with her cane for support.  She tends to still have  problems balancing on the left side today.  Hyperactive reflexes noted on  the left side throughout.  Sensation fair in left upper and lower  extremities today.  Motor function is 3+ to 4+/5 in a variable fashion left  arm and leg today.  HEART:  Regular rate and rhythm.  LUNGS:  Clear.  ABDOMEN: Soft, nontender.  EXTREMITIES:  Left shoulder reveals crepitus with passive range of motion  and impingement maneuvers today.   ASSESSMENT:  1.  Spastic dystonia and dyskinesia as well as TBI.  2.  Osteoarthritis of bilateral knees.  3.  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  4.  Degenerative disk disease and joint disease of lumbar spine.  5.  History of bilateral greater trochanter bursitis.   PLAN:  1.  Will  reduce Sinemet to twice daily dosing at the 25/100 dose.  2.  I refilled Ultram ER 200 mg daily.  She will remain on Keppra 500 mg 3      times a day.  3.  Will set the patient up for Synvisc injections to the left shoulder      today to address her arthritis.  She has had good results with the      Synvisc on the right knee, and I would like to avoid doing any more      steroid injections in the near future.  4.  I will see the patient back in about 1 to 2 weeks' time depending on      scheduled for another injection.      Ranelle Oyster, M.D.  Electronically Signed     ZTS/MedQ  D:  11/23/2005 14:21:31  T:  11/23/2005 16:33:22  Job #:  [id number]

## 2011-03-05 NOTE — Assessment & Plan Note (Signed)
FOLLOWUP VISIT - Feb 15, 2006.   SUBJECTIVE:  Kendra [name] is back regarding her ongoing dyskinesias and shoulder  pain.  She has done very nicely with the Synvisc to her left shoulder.  We  have found a medication in Klonopin that seems to be working for  dyskinesias.  Essentially she is using 0.25 mg q.i.d.; the 0.25 does not  seem to cause any significant sedation and she notes significant improvement  in the involuntary movements of the left upper extremity.  She does  experience still some left shoulder pain towards the end of the day which is  associated with extra movements but overall this has improved dramatically.  She does complain of some instability in the right knee and occasionally the  left knee which has been a chronic problem.   The patient rates her pain as a 3 out of 10 and describes it as stabbing.  The pain interferes with quality of life at a  moderate level.  Sleep is  good.   The patient remains on Keppra 500 mg t.i.d. for seizure prophylaxis.  Has  been doing well and not having any significant side effects with this  currently.  The patient is using Tylenol for break through pain.   REVIEW OF SYSTEMS:  On review of systems the patient denies any new  neurological or psychiatric issues today.  She does report some limb  swelling.  She denies any other constitutional, genitourinary, genitourinary  or cardiorespiratory complaints today.   SOCIAL HISTORY:  The patient is single, living with her mother who remains  extremely supportive.   PHYSICAL EXAMINATION:  VITAL SIGNS:  Blood pressure 109/57, pulse 75,  respiratory rate 16.  She is sating 94% on room air.  GENERAL APPEARANCE:  Patient is pleasant, in no acute distress.  HEART:  Regular rate and rhythm.  LUNGS:  Clear.  ABDOMEN:  Soft, nontender.  NEUROLOGICAL:  Patient is alert and oriented.  Affect is bright.  EXTREMITIES:  Gait remains unstable but she has improved from prior visits.  The right knee tends to  want to buckle and flex forward, but she seems to be  able to control this especially with a cane.  Less dyskinesias and  involuntary movements were seen in the left upper extremity today.  She was  able to perform some involuntary movements and a few fine motor activities  as well.  Reflexes are 2++ on the left.  Sensation is grossly intact on the  left upper and lower extremities today.  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  Motor function is  generally 5/5 in the right upper extremity, 3-3+/5 of the left upper  extremity.  The right lower extremity is 4/5 and left lower extremity is 3+-  4/5.   ASSESSMENT:  1.  Status post _____ with chronic left sided spastic hemiparesis and      dyskinesias.  2.  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  3.  Seizure disorder.   PLAN:  1.  Patient is doing better with the Klonopin than she has with any other      medication.  We will continue with the 0.25 mg q.i.d.  2.  Recommended 200 to 400 mg of ibuprofen with dinner to help with any      irritation at the shoulder.  3.  Recommended caution in use of cane for gait as her knees remain      unstable.  4.  I will see the patient back in about three months time.  In general, I      am pleased with her progress.  I did refill Keppra 500 mg t.i.d.      Ranelle Oyster, M.D.  Electronically Signed     ZTS/MedQ  D:  02/15/2006 16:17:01  T:  02/16/2006 10:11:02  Job #:  [id number]

## 2011-03-05 NOTE — Assessment & Plan Note (Signed)
MEDICAL RECORD NUMBER:  [id number].   Kendra [name] is back regarding her traumatic brain injury and dyskinesias on the  left side. She has continued having problems with movements on the left.  Botox seems to help for a period of time, but then she slowly returns to  baseline. She continues to complain of left shoulder pain. She was unable to  tolerate the fentanyl patch due to problems with her gait and significant  fatigue. The patient rates her pain overall as an 8/10, described as sharp  and stabbing. She is taking usually three Ultram a day and uses three Soma a  day. Tresa Garter makes her very sleepy. Sleep is generally poor due to spasms. She  has a hard time getting to sleep. She does use Klonopin at night with fair  results. The Klonopin dose is 0.5 mg.   REVIEW OF SYSTEMS:  The patient reports trouble walking with three or four  falls this last month. Occasional spasms, dizziness, depression, anxiety.  Other review of systems items are negative, and full review of systems is  listed in the health and history section of the chart.   SOCIAL HISTORY:  Unchanged.   PHYSICAL EXAMINATION:  Blood pressure is 112/69, pulse 76, respiratory rate  16. She is saturating 96% on room air. The patient is pleasant in no acute  distress. She is alert and oriented x3. Neurological exam is essentially  unchanged. She has ongoing dystonia in the left upper and lower extremities  that fluctuates still. No consistent tone is noted today. Range of motion of  the neck and back is fair. She had pain with rotation of the shoulder and  impingement maneuvers today. There is also pain along the left AC joint. The  patient ambulated for me today and was impulsive with her transfers and  steps and often got too far up front either with her body or with the cane.  She is at risk of falls due to some of her impulsive movement.   ASSESSMENT:  1.  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  2.   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  3.  Questionable rotator cuff syndrome, left shoulder.  4.  History of degenerative disk disease and degenerative joint disease of      the lumbar spine.  5.  History of bilateral greater trochanter bursitis.   PLAN:  1.  Add low dose Sinemet to use at night to help with some of her spasms and      movements. We will try the 10/100 dose. The patient may repeat this x1      as needed.  2.  For pain, we will initiate Ultram ER 100 mg daily for 2 days and up to      200 mg daily thereafter. She may still use the Ultram occasionally for      breakthrough symptoms.  3.  For muscle spasms, we will try to switch to Flexeril 5 mg q.8h. p.r.n. I      think this will be much less sedating for her than the Soma.  4.  We will have a MRI performed of the left shoulder to rule out rotator      cuff disease and significant degenerative joint disease of the left      shoulder.  5.  We will see the patient back in one month's time.      Ranelle Oyster, M.D.  Electronically Signed  ZTS/MedQ  D:  08/06/2005 12:48:31  T:  08/06/2005 16:18:24  Job #:  [id number]

## 2011-03-05 NOTE — Assessment & Plan Note (Signed)
MEDICAL RECORD NUMBER:  [id number]   Kendra [name] is back regarding her traumatic brain injury and left-sided  dyskinesias. 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 Also, there is a superior  labral tear noted and a mild partial tear of the distal supraspinatus  tendon. The Ultram ER at 200 mg seems to be helping somewhat with her pain.  She had an episode a week ago where her Keppra ran out and she began to have  increased twitching and movements which were uncontrollable. She went to the  emergency room and it was decided that these were only secondary to her  Keppra running out. This was resumed and she has had no further problems.  The Sinemet seems to be helping additionally with her movements as well. Her  dose of Sinemet is at 10/100 q.h.s.   The patient rates her pain at a 7/10. Described as stabbing and aching. Pain  interferes with general activity, relationships with others, and enjoyment  of life on a moderate level.   REVIEW OF SYSTEMS:  The patient reports weakness, trouble walking, tingling,  some limb swelling in the left arm due to it dependent positioning.   SOCIAL HISTORY:  No new issues are noted today.   PHYSICAL EXAMINATION:  Blood pressure is 146/75, pulse is 105, respiratory  rate is 16, she is saturating 96% in room air. The patient is pleasant in no  acute distress. She is alert and oriented x3. Affect is stable. Left arm  movements are still present although seems to be bit more controlled. The  patient had pain in the glenohumeral joint and the left AC joint was also  tender today. The left humerus is subluxed inferiorly and anteriorly.  Rotator cuff maneuvers were pain provoking as well today. Otherwise, tone  was stable. Cognitively, she is appropriate and alert today.   ASSESSMENT:  1.  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  2.  Osteoarthritis of bilateral knees.  3.   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  4.  History of degenerative disc disease and joint disease of the lumbar      spine.  5.  History of bilateral greater trochanter bursitis.   PLAN:  1.  Increase Sinemet to 25/100 mg q.h.s.  2.  Refilled the Ultram ER 200 mg daily.  3.  Refilled Keppra 500 mg t.i.d.  4.  After informed consent, we injected the left glenohumeral region with 60      mg Kenalog and 3 mL of 1% lidocaine. The patient tolerated this well.      She had some relief in the arm before she left the office today.  5.  Will check x-rays of the left shoulder to further discern the arthritic      picture. Consider joint aspiration, although she has not had a      significant amount of fluid collection. Consider orthopedic referral.  6.  I will see the patient back in 1 month's time.     Ranelle Oyster, M.D.  Electronically Signed    ZTS/MedQ  D:  09/03/2005 12:19:38  T:  09/03/2005 15:08:09  Job #:  [id number]

## 2011-03-05 NOTE — Assessment & Plan Note (Signed)
Kendra [name] was just discharged home status post diskectomy and I believe fusion at  the L3-4 level for significant left sided disk herniation with  radiculopathy. The patient progressed to an independent/supervision level  with therapies. She has been having a lot of problems with her left leg  since the injury. She states that pain is improving for the most part. She  is not able to tolerate the Vicodin secondary to dizziness and fatigue, but  it does help the pain. She has used Ultram in the past with a little better  results. Also, she complains of left leg having some of the similar dystonic  movements that she has had in the left upper extremity. The dystonia seems  to be most often in the form of equinovarus movements of the left foot. She  is also having some bending at the left knee on a frequent basis. She has  had some falls as a result of this at home, and her mother is a little  worried with her walking unattended in the house. She denies any frank new  numbness in the leg. She is wearing her hinged double upright AFOs. The left  knee also remains painful which has been a chronic problem. Left upper  extremity has some similar dystonias, but it has been better controlled for  the most part. We used Botox on that on a couple of occasions in the past.   REVIEW OF SYSTEMS:  The patient denies any new problems with sleep, nausea,  vomiting, diarrhea, bowel or bladder complaints. Denies shortness of breath  or chest pain.   PHYSICAL EXAMINATION:  The patient is pleasant in no acute distress. Blood  pressure 120/67, pulse 81, respiratory rate 12. She is saturating 99% on  room air. The left upper extremity is essentially stable with fluctuating  movements of flexion and extension at the elbow but to a lesser extent. She  has significant dystonia of the left lower extremity, most prominent in  plantar flexion and inversion of the foot. Toes often curled in plantar  flexion as well. This  was worse when we stretched the calf and knee today in  extension. Also saw fluctuating tightness in the quadriceps and hamstrings  but most predominantly in the hamstring muscles which brought the knee into  flexion. She ambulated today and had some problems with the toes curling  under when she walked as well as the plantar flexion abnormality. When she  tried to change to direction, the knee quickly flexed on her period. These  symptoms were transient, and she is able to reduce the leg and to flex into  position a few moments later. Reflexes were increased. Sensory exam was  maintained.   ASSESSMENT:  1. Ongoing spastic dystonia on the left which has been worsened since her     recent lumbar disk herniation and radiculopathy.  2. Osteoarthritis of the knees and likely bilateral hips.  3. Status post two lumbar surgeries, most recent was a L3-L4 diskectomy and     I believe fusion. I do not have the details of her recent discharge     summary available.  4. History of greater trochanteric bursitis, left greater than right.   PLAN:  1. We will set her up for Botox injections of the left gastroc-soleus and     tibialis posterior muscles as available appointment arises over the next     one to two weeks.  2. We will start her on low dose Keppra to  see we can decrease some of her     neuronal hyperexcitability. We will begin her on low dose Keppra at 250     mg q.h.s. and will increase to 500 mg if she tolerates this. She has had     a history of medication intolerance in the past.  3. Continue with ambulation and therapy, and hopefully some of the dystonia     will improve with her strengthening     of the left lower extremity.  4. Will see her back pending the timing of the Botox appointment.      Ranelle Oyster, M.D.   ZTS/MedQ  D:  10/01/2003 14:51:38  T:  10/01/2003 16:28:46  Job #:  [id number]   cc:   Henry A. Pool, M.D.  [address]  [place]  [place]  [address]  Fax: [phone]

## 2011-03-05 NOTE — Procedures (Signed)
NAMELIEN, LYMAN NO.:  [id number]   MEDICAL RECORD NO.:  [id number]          PATIENT TYPE:  REC   LOCATION:  [facility]                          FACILITY:  [facility]   PHYSICIAN:  Ranelle Oyster, M.D.DATE OF BIRTH:  1961/06/20   DATE OF PROCEDURE:  06/22/2006  DATE OF DISCHARGE:                                 OPERATIVE REPORT   PROCEDURE:  Synvisc injection.   DIAGNOSTIC CODE:  715.91, osteoarthritis of the left shoulder.   DESCRIPTION OF PROCEDURE:  After informed consent and preparation of the  skin with Betadine and isopropyl alcohol, we entered via the posterior  approach today. After aspiration, we injected 2 mL of aqueous Synvisc  solution with a 2 inch 21-gauge needle. The patient tolerated it well. The  material was injected into the subacromial space. No complications were  experienced. This is the second of three injections. We will followup with  the third injection in about 7-10 days.      Ranelle Oyster, M.D.  Electronically Signed     ZTS/MEDQ  D:  06/22/2006 09:06:12  T:  06/22/2006 11:21:56  Job:  [id number]

## 2011-03-05 NOTE — Procedures (Signed)
Kendra [name], Kendra [name]                ACCOUNT NO.:  [id number]   MEDICAL RECORD NO.:  [id number]          PATIENT TYPE:  REC   LOCATION:  [facility]                          FACILITY:  [facility]   PHYSICIAN:  Ranelle Oyster, M.D.DATE OF BIRTH:  07-26-1961   DATE OF PROCEDURE:  01/14/2006  DATE OF DISCHARGE:                                 OPERATIVE REPORT   PROCEDURE:  Synvisc injection, diagnostic code 715.91.   DESCRIPTION OF PROCEDURE:  After informed consent and preparation of the  skin with isopropyl alcohol and Betadine, we injected the left shoulder via  the lateral approach using a 22-gauge 1-1/2 inch needle.  We injected 5 mL  of aqueous Synvisc solution into the left shoulder after aspiration.  The  patient had some mild bleeding but otherwise no complications.  She was  given postinjection instructions today.  This was the third of three  injections.   We will continue with Klonopin, although this was causing some sedation, but  stop Flexeril, which she had been taking t.i.d.  Observe for effect on her  dyskinesias.   I will see the patient back in about one month's time.      Ranelle Oyster, M.D.  Electronically Signed     ZTS/MEDQ  D:  01/14/2006 12:29:33  T:  01/17/2006 06:06:18  Job:  [id number]

## 2011-03-05 NOTE — Assessment & Plan Note (Signed)
MEDICAL RECORD NUMBER:  [id number].   Kendra [name] is here in follow up of her pain syndrome and dyskinesia. The left  shoulder did very well with the glenohumeral injection we performed last  visit. We also increased her Sinemet which has helped her at night time. She  notes some dyskinesias during the day. Mother notes less facial twitching as  well with the Sinemet. She has had no adverse side effects to the  medication. The patient rates her pain on average a 6 to 8/10. Pain mostly  in the left shoulder radiating downwards and somewhat into the left leg as  well. Pain is affecting her general activity and relations with others and  enjoyment of life on a moderate to severe level. Pain is worse in the  evening and night time areas. Pain increases also with walking, bending, and  standing.   REVIEW OF SYSTEMS:  The patient reports weakness, tremor, tingling, trouble  walking, spasms, dizziness, anxiety, and limb swelling. Other pertinent  positives listed above.   SOCIAL HISTORY:  The patient lives with her parents.   PHYSICAL EXAMINATION:  VITAL SIGNS:  Blood pressure 111/68, pulse 92,  respiratory rate 16. She is saturating 97% on room air.  GENERAL:  The patient is pleasant in no acute distress. She is alert and  oriented x3. Affect is bright and appropriate. Gait is stable. Coordination  is unchanged with some ongoing dyskinesia on the left although these  appeared to be decreased from last visit. She is hyperactive with reflexes  on the left side. Sensation is generally intact on the left upper and lower  extremities today.  HEART:  Regular rate and rhythm.  LUNGS:  Clear.  ABDOMEN:  Soft, nontender.  MUSCULOSKELETAL:  She continues to have some crepitus and grinding in the  left shoulder with passive movement. Cognitively, the patient is  appropriate.   ASSESSMENT:  1.  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  2.  Osteoarthritis of bilateral knees.  3.   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  4.  History of degenerative disk disease and joint disease of the lumbar      spine.  5.  History of bilateral greater trochanter bursitis.   PLAN:  1.  Will increase Sinemet to 25/100 b.i.d. initially and titrate up as      needed to q.6h.  2.  Refilled her Ultram ER 200 mg daily. She will continue with her Keppra      500 mg t.i.d.  3.  The patient does not wish to seek any surgical opinion. She has multiple      loose bodies, arthritis in the left shoulder which could possibly be      amendable to surgical treatment. Will keep this on the back burner for      now.  4.  I will see the patient back in about one to two months' time.      Ranelle Oyster, M.D.  Electronically Signed     ZTS/MedQ  D:  09/28/2005 16:33:12  T:  09/29/2005 09:01:51  Job #:  [id number]

## 2011-03-05 NOTE — Procedures (Signed)
NAME:  Kendra [name], Kendra [name]                          ACCOUNT NO.:  [id number]   MEDICAL RECORD NO.:  [id number]                   PATIENT TYPE:  REC   LOCATION:  [facility]                                 FACILITY:  [facility]   PHYSICIAN:  Ranelle Oyster, M.D.             DATE OF BIRTH:  11-11-1960   DATE OF PROCEDURE:  05/29/2004  DATE OF DISCHARGE:                                 OPERATIVE REPORT   MEDICAL RECORD NUMBER:  [id number].   ICD-9 code for procedure is 715.96.   [name] was brought into today for a Synvisc injection to the right knee for  ongoing pain due to osteoarthritis and gait abnormality. She had fair  results with the first injection. She had some extra pain after the second  one as we had some slight difficulty upon entry into the knee. She continues  to walk with a brace on the left foot, and her brain injury deficits lead to  abnormal gait patterns.   After informed consent and appropriate preparation with Betadine, the right  knee was entered. Aspiration was performed. The patient was injected with  Synvisc solution in the prepared vial. The patient had no adverse effects to  the injections. She was given post injection instructions. She will proceed  to go to physical therapy to work further on her gait patterns. I will see  the patient back in about two months' time.                                                Ranelle Oyster, M.D.    ZTS/MEDQ  D:  05/29/2004 12:04:01  T:  05/30/2004 13:15:01  Job:  [id number]

## 2011-03-05 NOTE — Assessment & Plan Note (Signed)
HISTORY:  The patient is here in followup today on February 04, 2004, for her  multiple issues related to her TBI and lumbar spine disease.  At her last  visit we injected her left gastrocnemius and tibialis posterior muscles with  300 units total of botulinum toxin, and the patient did very nicely with  these.  She has lost a lot of the spasm in the ankle as well as the equina  varus deformity.  She is having problems with the right knee, however, which  has been a problem in the past.  She notes that the right knee drags and  bends, and causes her to have problems with gait and certainly pain.  The  patient also notes some difficulties with her left arm and some dystonic  movements there.   REVIEW OF SYSTEMS:  The patient denies any chest pain, shortness of breath,  nausea, vomiting, or diarrhea.  She has had no dizziness or fatigue.  No  mental status changes.  Sleep has been better with the Keppra.  She has had  no adverse effects with the Keppra in general.   PHYSICAL EXAMINATION:  GENERAL:  The patient is pleasant, in no acute  distress.  VITAL SIGNS:  Blood pressure 122/58, pulse 86, saturation 96% on room air.  NEUROLOGIC:  The patient has some dystonic movements in the left upper  extremity, but not to the severity she has had in the past.  The left ankle  is freely mobile and more controlled.  She does have some edema in the left  lower extremity.  Right knee had some pain with valgus and varus stresses.  There was some peripatellar swelling noted.  The patient ambulated for me  today and had a valgus deformity of the right knee, which effected clearance  of her left foot.  It essentially made her left leg longer than the right in  gait.  She was able to walk without her quad cane, but did better with the  cane.  Cognitively intact.  Her back examination was stable.  The patient  was wearing her lumbar corset today.   ASSESSMENT:  1. Ongoing spastic dystonia on the left, as  related to her brain injury -     code #342.12.  2. Osteoarthritis of the bilateral knees, right greater than left - code     #715.96.  3. Status post lumbar back surgery and disk disease - code #722.52.  4. Greater trochanter bursitis, left greater than right - code #726.5.   PLAN:  1. After an informed consent and preparation, we injected the right knee     today with 60 mg of Kenalog and 3 mL of 1% lidocaine.  The patient     tolerated this well.  2. As she has tolerated the Keppra well, and I think this has decreased some     of her dystonia, we will increase her to 1000 mg at bedtime, and keep the     morning dose at 500 mg.  3. We discussed continuing with her ambulation and working on the quality of     her ambulation, rather than the quantity.  I would like her to use her     cane to promote better posture and leg control.  The patient may be a     candidate for Synvisc injections in the future.  We will also consider     future Botox of the left upper extremity as needed.  4. I  will see the patient back in two months' time.      Ranelle Oyster, M.D.   ZTS/MedQ  D:  02/04/2004 15:34:54  T:  02/04/2004 16:52:43  Job #:  [id number]

## 2011-03-05 NOTE — Discharge Summary (Signed)
NAME:  Kendra [name], Kendra [name]                          ACCOUNT NO.:  [id number]   MEDICAL RECORD NO.:  [id number]                   PATIENT TYPE:  INP   LOCATION:  3008                                 FACILITY:  [facility]   PHYSICIAN:  Kathaleen Maser. Pool, M.D.                 DATE OF BIRTH:  Mar 04, 1961   DATE OF ADMISSION:  08/06/2003  DATE OF DISCHARGE:  08/09/2003                                 DISCHARGE SUMMARY   FINAL DIAGNOSIS:  L4 fracture.  Status post L4 through S1 fusion.   OPERATIONS AND TREATMENTS:  L3-4 redo laminectomy with open reduction of  fracture, L3-4 posterior lumbar interbody fusion utilizing tangent wedges  and local autografting.  L3 through S1 posterior lateral fusion utilizing  pedicle screws.   HISTORY OF PRESENT ILLNESS:  Ms. Hipwell is a 50 year old female, status  post previous L4 through S1 fusion.  The patient presents with worsening  back and lower extremity pain.  Workup has demonstrated evidence of  breakdown of the L3-4 disk space and possible fracturing of the superior  aspect of the vertebral body of L4.  The patient presents now for  decompression and fusion.   HOSPITAL COURSE:  The patient was taken to the operating room where she  underwent a L3-4 redo laminectomy.  This did demonstrate evidence of  significant fracturing of the superior aspect of L4.  The fracture was  debrided and completely reduced.  Disk herniation was completely excised as  well.  The patient then underwent posterior lateral fusion as well as  interbody fusion for hopeful improvement in her symptoms.  Postoperatively,  the patient did quite well.  Back and lower extremity pain were much  improved.  Wound is healing well.  She gradually mobilized with the  assistance of physical therapy.  She is to be transferred to the rehab unit  for further therapy.   CONDITION ON DISCHARGE:  Improved.   DISCHARGE DISPOSITION:  The patient will be followed up in my office upon  discharge from the  rehab unit.                                                Henry A. Pool, M.D.    HAP/MEDQ  D:  09/05/2003  T:  09/06/2003  Job:  [id number]

## 2011-03-25 DIAGNOSIS — I10 Essential (primary) hypertension: Secondary | ICD-10-CM

## 2011-03-25 DIAGNOSIS — K589 Irritable bowel syndrome without diarrhea: Secondary | ICD-10-CM | POA: Insufficient documentation

## 2011-03-25 DIAGNOSIS — R61 Generalized hyperhidrosis: Secondary | ICD-10-CM

## 2011-03-25 DIAGNOSIS — M7989 Other specified soft tissue disorders: Secondary | ICD-10-CM | POA: Insufficient documentation

## 2011-06-23 ENCOUNTER — Encounter: Payer: Medicare Other | Admitting: Physical Medicine & Rehabilitation

## 2011-09-24 ENCOUNTER — Encounter: Payer: Medicare Other | Attending: Physical Medicine & Rehabilitation | Admitting: Physical Medicine & Rehabilitation

## 2011-09-24 DIAGNOSIS — M719 Bursopathy, unspecified: Secondary | ICD-10-CM | POA: Insufficient documentation

## 2011-09-24 DIAGNOSIS — G40909 Epilepsy, unspecified, not intractable, without status epilepticus: Secondary | ICD-10-CM | POA: Insufficient documentation

## 2011-09-24 DIAGNOSIS — R279 Unspecified lack of coordination: Secondary | ICD-10-CM | POA: Insufficient documentation

## 2011-09-24 DIAGNOSIS — S069X9S Unspecified intracranial injury with loss of consciousness of unspecified duration, sequela: Secondary | ICD-10-CM | POA: Insufficient documentation

## 2011-09-24 DIAGNOSIS — M67919 Unspecified disorder of synovium and tendon, unspecified shoulder: Secondary | ICD-10-CM | POA: Insufficient documentation

## 2011-09-24 DIAGNOSIS — R569 Unspecified convulsions: Secondary | ICD-10-CM

## 2011-09-24 DIAGNOSIS — G8110 Spastic hemiplegia affecting unspecified side: Secondary | ICD-10-CM

## 2011-09-24 DIAGNOSIS — E669 Obesity, unspecified: Secondary | ICD-10-CM | POA: Insufficient documentation

## 2011-09-24 DIAGNOSIS — X58XXXS Exposure to other specified factors, sequela: Secondary | ICD-10-CM | POA: Insufficient documentation

## 2011-09-24 DIAGNOSIS — S069XAS Unspecified intracranial injury with loss of consciousness status unknown, sequela: Secondary | ICD-10-CM | POA: Insufficient documentation

## 2011-09-24 DIAGNOSIS — S069X9A Unspecified intracranial injury with loss of consciousness of unspecified duration, initial encounter: Secondary | ICD-10-CM

## 2011-09-24 DIAGNOSIS — M19019 Primary osteoarthritis, unspecified shoulder: Secondary | ICD-10-CM

## 2011-09-24 DIAGNOSIS — M129 Arthropathy, unspecified: Secondary | ICD-10-CM | POA: Insufficient documentation

## 2011-09-24 NOTE — Assessment & Plan Note (Signed)
HISTORY:  Kendra [name] is back regarding her chronic pain.  She has been generally stable from a neurological standpoint over the last few months.  She did miss her last appointment with me after she became sick and was in the hospital.  Apparently she suffered from an upper respiratory infection that worsens.  In other words she reports that she has not fallen for a couple of months.  She did stop the ReQuip and felt that the ReQuip was making her more dizzy in the morning hours and contributing to this.  Znya reports some pain in the right elbow as well as both shoulders.  Pain is stabbing and aching.  REVIEW OF SYSTEMS:  Notable for the above.  Full 12-point review is in the written health and history section of the chart.  SOCIAL HISTORY:  Noted above.  The patient lives with her mother who is present with her today.  PHYSICAL EXAMINATION:  VITAL SIGNS:  Blood pressure is 110/58, pulse is 81, respiratory rate 16, and she is satting 93% on room air. GENERAL:  The patient is pleasant, alert, although a bit more fatigued than I have seen her in the past.  She did appear to have some cold symptoms.  Left arm was at baseline from a movement standpoint.  She has pain in the left shoulder as well as the right.  Right elbow is painful at the olecranon, although no swelling is noted.  No color change or drainage.  Both knees have some crepitus with extension and flexion. HEART:  Regular. CHEST:  Clear. ABDOMEN:  Soft and nontender.  ASSESSMENT: 1. 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  The     patient obviously with commitment rotator cuff tendinitis on the     left. 2. Seizure disorder. 3. Obesity.  PLAN: 1. The patient is doing a bit better with a safe approach at home and     more conservative mobility approach as well. 2. The patient stopped ReQuip and I am okay with that. 3. Refilled Norco 7.5/325  one q.12 hours p.r.n. #60. 4. 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 #90. 5. Tramadol 1 q.6 hours p.r.n. #90. 6. I told the patient that her family physician may fill her     medications if he is willing.  If not, I will continue filling them     here through this office. 7. At this point, we will schedule her a 92-month followup.     Ranelle Oyster, M.D. Electronically Signed    ZTS/MedQ D:  09/24/2011 11:14:29  T:  09/24/2011 13:03:07  Job #:  [id number]  cc:   Simone Curia, MD Fax: [phone]

## 2011-12-12 ENCOUNTER — Other Ambulatory Visit: Payer: Self-pay | Admitting: Physical Medicine & Rehabilitation

## 2011-12-28 ENCOUNTER — Other Ambulatory Visit: Payer: Self-pay | Admitting: *Deleted

## 2011-12-28 MED ORDER — CLONAZEPAM 1 MG PO TABS: ORAL_TABLET | ORAL | Status: DC

## 2012-01-13 ENCOUNTER — Other Ambulatory Visit: Payer: Self-pay | Admitting: *Deleted

## 2012-01-13 MED ORDER — HYDROCODONE-ACETAMINOPHEN 7.5-325 MG PO TABS
1.0000 | ORAL_TABLET | Freq: Two times a day (BID) | ORAL | Status: DC
Start: 2012-01-13 — End: 2012-01-21

## 2012-01-21 ENCOUNTER — Encounter: Payer: Medicare HMO | Attending: Physical Medicine & Rehabilitation | Admitting: Physical Medicine & Rehabilitation

## 2012-01-21 ENCOUNTER — Encounter: Payer: Self-pay | Admitting: Physical Medicine & Rehabilitation

## 2012-01-21 VITALS — BP 137/73 | HR 72 | Resp 16 | Ht 67.0 in | Wt 210.0 lb

## 2012-01-21 DIAGNOSIS — G40909 Epilepsy, unspecified, not intractable, without status epilepticus: Secondary | ICD-10-CM | POA: Insufficient documentation

## 2012-01-21 DIAGNOSIS — S069XAS Unspecified intracranial injury with loss of consciousness status unknown, sequela: Secondary | ICD-10-CM | POA: Insufficient documentation

## 2012-01-21 DIAGNOSIS — G8110 Spastic hemiplegia affecting unspecified side: Secondary | ICD-10-CM

## 2012-01-21 DIAGNOSIS — E669 Obesity, unspecified: Secondary | ICD-10-CM | POA: Insufficient documentation

## 2012-01-21 DIAGNOSIS — S069X9A Unspecified intracranial injury with loss of consciousness of unspecified duration, initial encounter: Secondary | ICD-10-CM

## 2012-01-21 DIAGNOSIS — X58XXXS Exposure to other specified factors, sequela: Secondary | ICD-10-CM | POA: Insufficient documentation

## 2012-01-21 DIAGNOSIS — R279 Unspecified lack of coordination: Secondary | ICD-10-CM | POA: Insufficient documentation

## 2012-01-21 DIAGNOSIS — M19019 Primary osteoarthritis, unspecified shoulder: Secondary | ICD-10-CM

## 2012-01-21 DIAGNOSIS — M1710 Unilateral primary osteoarthritis, unspecified knee: Secondary | ICD-10-CM

## 2012-01-21 DIAGNOSIS — S069X9S Unspecified intracranial injury with loss of consciousness of unspecified duration, sequela: Secondary | ICD-10-CM | POA: Insufficient documentation

## 2012-01-21 DIAGNOSIS — G8114 Spastic hemiplegia affecting left nondominant side: Secondary | ICD-10-CM | POA: Insufficient documentation

## 2012-01-21 MED ORDER — MORPHINE SULFATE 15 MG PO TABS: 15.0000 mg | ORAL_TABLET | Freq: Two times a day (BID) | ORAL | Status: AC | PRN

## 2012-01-21 MED ORDER — MELOXICAM 15 MG PO TABS: 15.0000 mg | ORAL_TABLET | Freq: Every day | ORAL | Status: DC

## 2012-01-21 NOTE — Patient Instructions (Signed)
Please do not take the hydrocodone, tramadol, or celebrex because we changed you to the meloxicam and morphine.

## 2012-01-21 NOTE — Progress Notes (Signed)
Subjective:    Patient ID: Kendra [name], female    DOB: 02/11/1961, 51 y.o.   MRN: [id number]  HPI Kendra [name] is back regarding her chronic pain and brain injury related issues.  Her left shoulder and axillary areas have been bothering her more over the last couple months.  Hydrocodone doesn't seem to help. Klonopin helps some but it makes her sleepy. Ultram doesn't touch the pain.  She feels like the muscles around the shoulder are tighter.  Her knees continue to give her problems.  She has had to back off of her walking a bit as a result.  She is applying to get a new scooter. We have had lengthy discussions regarding safe mobility in the past.   Her mother continues to live with her.  Her mom is having health issues of her own.   Pain Inventory Average Pain 7 Pain Right Now 7 My pain is constant, sharp, stabbing and aching  In the last 24 hours, has pain interfered with the following? General activity 10 Relation with others 9 Enjoyment of life 10 What TIME of day is your pain at its worst? night Sleep (in general) Poor  Pain is worse with: walking, sitting and standing Pain improves with: medication and injections Relief from Meds: 4  Mobility walk with assistance use a cane ability to climb steps?  no do you drive?  no use a wheelchair needs help with transfers  Function disabled: date disabled 1998 I need assistance with the following:  dressing, bathing, household duties and shopping  Neuro/Psych weakness trouble walking spasms  Prior Studies Any changes since last visit?  no  Physicians involved in your care Any changes since last visit?  no  Review of Systems  HENT: Negative.   Eyes: Negative.   Respiratory: Negative.   Gastrointestinal: Negative.   Genitourinary: Negative.   Musculoskeletal: Negative.   Skin: Negative.   Neurological: Positive for weakness.  Hematological: Negative.        Objective:   Physical Exam  Constitutional: She is  oriented to person, place, and time. She appears well-developed and well-nourished.  HENT:  Head: Normocephalic.  Eyes: EOM are normal. Pupils are equal, round, and reactive to light.  Neck: Normal range of motion.  Cardiovascular: Normal rate and regular rhythm.   Pulmonary/Chest: Effort normal and breath sounds normal.  Abdominal: Soft. She exhibits no distension. There is no tenderness.  Musculoskeletal:       Left shoulder with crepitus and pain with rotational,movements.  inpingement manevers were positive  Neurological: She is alert and oriented to person, place, and time.       Continued dsykinesias of the left upper ext.  Does better when she is relaxed.  Speech slurred at baseline. Cognition is appropriate  Skin: Skin is warm.  Psychiatric: She has a normal mood and affect. Her behavior is normal. Judgment and thought content normal.          Assessment & Plan:  ASSESSMENT:  1. 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 The  patient obviously with commitment rotator cuff tendinitis on the  left.  2. Seizure disorder.  3. Obesity.  PLAN:  1. The patient is doing a bit better with a safe approach at home and  more conservative mobility approach as well.  2. After informed consent and preparation of the skin, i injected the left shoulder with 40mg  kenalog and 3 cc 1% lidocaine  Via lateral approach. Patient tolerated without any issues. A dressing was applied. 3. Will add meloxicam 15mg  in place of the celebrex 4. 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 #90.  5. Replace tramadol and hydrocodone with low dose morphine IR 15mg  1/2 to 1 q6 hr prn #60  6.We will schedule her a 3 month followup.

## 2012-01-24 ENCOUNTER — Encounter: Payer: Self-pay | Admitting: Physical Medicine & Rehabilitation

## 2012-01-26 ENCOUNTER — Other Ambulatory Visit: Payer: Self-pay | Admitting: *Deleted

## 2012-01-26 MED ORDER — LEVETIRACETAM 500 MG PO TABS: 500.0000 mg | ORAL_TABLET | Freq: Two times a day (BID) | ORAL | Status: DC

## 2012-02-15 ENCOUNTER — Telehealth: Payer: Self-pay | Admitting: Physical Medicine & Rehabilitation

## 2012-02-15 MED ORDER — MORPHINE SULFATE 15 MG PO TABS: 15.0000 mg | ORAL_TABLET | Freq: Two times a day (BID) | ORAL | Status: AC | PRN

## 2012-02-15 NOTE — Telephone Encounter (Signed)
Pharmacy will not fill Morphine Sulfate without written Rx from MD.  She has 5 days left.

## 2012-02-15 NOTE — Telephone Encounter (Signed)
Rx printed waiting on signature.  Will contact pt when ready.

## 2012-02-16 NOTE — Telephone Encounter (Signed)
Pt informed medication is ready for pick up

## 2012-02-18 ENCOUNTER — Other Ambulatory Visit: Payer: Self-pay | Admitting: Physical Medicine & Rehabilitation

## 2012-02-18 ENCOUNTER — Telehealth: Payer: Self-pay | Admitting: Physical Medicine & Rehabilitation

## 2012-02-18 NOTE — Telephone Encounter (Signed)
Calling about Keppra.  Message very broken, attempted to call back x 2, phone busy.

## 2012-02-18 NOTE — Telephone Encounter (Signed)
When they picked up Keppra at pharmacy the rx was wrong. I sent in 500mg  1 TID #90 but the pharmacy filled 500mg  1 BID #60. Spoke to Foster at [facility] and he is going to correct for them.

## 2012-02-22 ENCOUNTER — Encounter: Payer: Self-pay | Admitting: Physical Medicine & Rehabilitation

## 2012-02-22 ENCOUNTER — Telehealth: Payer: Self-pay | Admitting: Physical Medicine & Rehabilitation

## 2012-02-22 NOTE — Telephone Encounter (Signed)
Ruby Hassey (Calissa's mom) came by to pick up her script and wanted to let Dr. Riley Kill know, patient is sleeping more.  Would like to know if her pills can be cut in half.  Please call her at [phone]

## 2012-02-22 NOTE — Telephone Encounter (Addendum)
Please advise.  WOULD STOP TAKING KLONOPIN IF SHE HASN'T ALREADY---ZTS

## 2012-02-23 NOTE — Telephone Encounter (Signed)
Ruby is aware.  

## 2012-02-23 NOTE — Telephone Encounter (Addendum)
Kendra [name] is aware of Dr. Riley Kill recommendation. She is wanting to know if the Morphine can be decreased. States that Kendra [name] is irritable and hateful. Could the Morphine be doing this to her?  CUT MORPHINE IN HALF OR DISCONTINUE IT ENTIRELY.  IT WAS ONLY WRITTEN TO BE USED PRN---ZTS

## 2012-04-21 ENCOUNTER — Encounter: Payer: Self-pay | Admitting: Physical Medicine & Rehabilitation

## 2012-04-21 ENCOUNTER — Encounter: Payer: Medicare HMO | Attending: Physical Medicine & Rehabilitation | Admitting: Physical Medicine & Rehabilitation

## 2012-04-21 VITALS — BP 121/56 | HR 79 | Resp 14 | Ht 64.0 in | Wt 207.0 lb

## 2012-04-21 DIAGNOSIS — G8110 Spastic hemiplegia affecting unspecified side: Secondary | ICD-10-CM

## 2012-04-21 DIAGNOSIS — IMO0002 Reserved for concepts with insufficient information to code with codable children: Secondary | ICD-10-CM

## 2012-04-21 DIAGNOSIS — M19019 Primary osteoarthritis, unspecified shoulder: Secondary | ICD-10-CM

## 2012-04-21 DIAGNOSIS — S069X9A Unspecified intracranial injury with loss of consciousness of unspecified duration, initial encounter: Secondary | ICD-10-CM

## 2012-04-21 DIAGNOSIS — M1710 Unilateral primary osteoarthritis, unspecified knee: Secondary | ICD-10-CM | POA: Insufficient documentation

## 2012-04-21 DIAGNOSIS — G8114 Spastic hemiplegia affecting left nondominant side: Secondary | ICD-10-CM

## 2012-04-21 DIAGNOSIS — S069XAA Unspecified intracranial injury with loss of consciousness status unknown, initial encounter: Secondary | ICD-10-CM

## 2012-04-21 DIAGNOSIS — X58XXXS Exposure to other specified factors, sequela: Secondary | ICD-10-CM | POA: Insufficient documentation

## 2012-04-21 MED ORDER — HYDROCODONE-ACETAMINOPHEN 5-325 MG PO TABS: 1.0000 | ORAL_TABLET | Freq: Four times a day (QID) | ORAL | Status: AC | PRN

## 2012-04-21 NOTE — Patient Instructions (Signed)
Find activities to distract you from his pain.

## 2012-04-21 NOTE — Progress Notes (Signed)
Subjective:    Patient ID: Kendra [name], female    DOB: 21-May-1961, 51 y.o.   MRN: [id number]  HPI  Kendra [name] is back regarding her chronic spasticity and pain problems. These injection that we performed 3 months ago seemed to help until about 2 weeks ago. We switched over to morphine for breakthrough pain and we're not sure that that helped any more than the hydrocodone. He does make her more sleepy. Mom tells me that she has to constantly cue Jamaris to use proper posture and technique with her left shoulder. She uses a wheelchair for longer distances. She will occasionally walk with her braces and a cane.  Pain Inventory Average Pain 8 Pain Right Now 7 My pain is intermittent  In the last 24 hours, has pain interfered with the following? General activity 9 Relation with others 8 Enjoyment of life 10 What TIME of day is your pain at its worst? night Sleep (in general) Poor  Pain is worse with: walking, bending and standing Pain improves with: medication Relief from Meds: 6  Mobility walk without assistance walk with assistance use a cane do you drive?  no use a wheelchair needs help with transfers  Function disabled: date disabled 67 I need assistance with the following:  dressing, meal prep, household duties and shopping  Neuro/Psych weakness tingling trouble walking spasms dizziness anxiety  Prior Studies Any changes since last visit?  no  Physicians involved in your care Any changes since last visit?  no   Family History  Problem Relation Age of Onset  . Diabetes Mother   . Cancer Father   . Cancer Maternal Grandfather    History   Social History  . Marital Status: Single    Spouse Name: N/A    Number of Children: N/A  . Years of Education: N/A   Social History Main Topics  . Smoking status: Never Smoker   . Smokeless tobacco: None  . Alcohol Use: No  . Drug Use: No  . Sexually Active:    Other Topics Concern  . None   Social History Narrative   . None   Past Surgical History  Procedure Date  . Cholecystectomy   . Spine surgery   . Brain surgery    Past Medical History  Diagnosis Date  . Kidney stones   . Osteoarthritis   . Muscle pain   . Seizures   . Anxiety    BP 121/56  Pulse 79  Resp 14  Ht 5\' 4"  (1.626 m)  Wt 207 lb (93.895 kg)  BMI 35.53 kg/m2  SpO2 94%  LMP 12/31/2011     Review of Systems  Constitutional: Positive for diaphoresis.  Musculoskeletal: Positive for gait problem.  Neurological: Positive for dizziness and weakness.  Psychiatric/Behavioral: The patient is nervous/anxious.   All other systems reviewed and are negative.       Objective:   Physical Exam Constitutional: She is oriented to person, place, and time. She appears well-developed and well-nourished.  HENT:  Head: Normocephalic.  Eyes: EOM are normal. Pupils are equal, round, and reactive to light.  Neck: Normal range of motion.  Cardiovascular: Normal rate and regular rhythm.  Pulmonary/Chest: Effort normal and breath sounds normal.  Abdominal: Soft. She exhibits no distension. There is no tenderness.  Musculoskeletal:  Left shoulder with crepitus and pain with rotational,movements. inpingement manevers were positive. The shoulder isn't truly subluxed but there is a 3/4 inch gap between the acromium and humeral head. Neurological: She is alert  and oriented to person, place, and time.  Continued dsykinesias of the left upper ext. Does better when she is relaxed.  Speech slurred at baseline. Cognition is appropriate. She is alert.  Skin: Skin is warm.  Psychiatric: She has a normal mood and affect. Her behavior is normal. Judgment and thought content normal.    Assessment & Plan:   ASSESSMENT:  1. 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 The  patient obviously with commitment rotator cuff tendinitis on the  left.  2. Seizure disorder.    3. Obesity.    PLAN:  1. We discussed the fact that she needs to find other things to help distract her from her pain, be it socially or spiritually.  2. After informed consent and preparation of the skin, i again injected the left shoulder with 40mg  kenalog and 3 cc 1% lidocaine Via lateral approach. Patient tolerated without any issues. A dressing was applied.  3. Stop  meloxicam due to lack of response. 4. 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 #90.  5. Could consider fentanyl patch but it would require much closer monitoring through our office.  6.We will schedule her a 4 month followup.

## 2012-05-17 ENCOUNTER — Telehealth: Payer: Self-pay | Admitting: Physical Medicine & Rehabilitation

## 2012-05-17 NOTE — Telephone Encounter (Signed)
Refill on Hydrocodone 

## 2012-05-18 MED ORDER — HYDROCODONE-ACETAMINOPHEN 5-325 MG PO TABS: 1.0000 | ORAL_TABLET | Freq: Four times a day (QID) | ORAL | Status: AC | PRN

## 2012-05-18 NOTE — Telephone Encounter (Signed)
Rx has been called in, pt mother aware.

## 2012-05-24 ENCOUNTER — Other Ambulatory Visit: Payer: Self-pay | Admitting: Physical Medicine & Rehabilitation

## 2012-06-09 ENCOUNTER — Other Ambulatory Visit: Payer: Self-pay | Admitting: *Deleted

## 2012-06-09 MED ORDER — HYDROCODONE-ACETAMINOPHEN 5-325 MG PO TABS: 1.0000 | ORAL_TABLET | Freq: Four times a day (QID) | ORAL | Status: DC | PRN

## 2012-06-09 NOTE — Telephone Encounter (Signed)
Refill- Hydrocodone

## 2012-06-09 NOTE — Telephone Encounter (Signed)
Pt mother aware that this has been called in.

## 2012-07-04 ENCOUNTER — Other Ambulatory Visit: Payer: Self-pay | Admitting: *Deleted

## 2012-07-04 MED ORDER — HYDROCODONE-ACETAMINOPHEN 5-325 MG PO TABS: 1.0000 | ORAL_TABLET | Freq: Four times a day (QID) | ORAL | Status: DC | PRN

## 2012-07-05 ENCOUNTER — Other Ambulatory Visit: Payer: Self-pay | Admitting: Physical Medicine & Rehabilitation

## 2012-07-06 ENCOUNTER — Other Ambulatory Visit: Payer: Self-pay

## 2012-07-06 MED ORDER — CLONAZEPAM 1 MG PO TABS: ORAL_TABLET | ORAL | Status: DC

## 2012-08-01 ENCOUNTER — Other Ambulatory Visit: Payer: Self-pay | Admitting: Physical Medicine & Rehabilitation

## 2012-08-22 ENCOUNTER — Encounter: Payer: Self-pay | Admitting: Physical Medicine & Rehabilitation

## 2012-08-22 ENCOUNTER — Encounter: Payer: Medicare HMO | Attending: Physical Medicine & Rehabilitation | Admitting: Physical Medicine & Rehabilitation

## 2012-08-22 VITALS — BP 128/51 | HR 78 | Resp 18 | Ht 64.0 in | Wt 207.0 lb

## 2012-08-22 DIAGNOSIS — M19019 Primary osteoarthritis, unspecified shoulder: Secondary | ICD-10-CM

## 2012-08-22 DIAGNOSIS — M259 Joint disorder, unspecified: Secondary | ICD-10-CM | POA: Insufficient documentation

## 2012-08-22 DIAGNOSIS — G8110 Spastic hemiplegia affecting unspecified side: Secondary | ICD-10-CM

## 2012-08-22 DIAGNOSIS — M25519 Pain in unspecified shoulder: Secondary | ICD-10-CM | POA: Insufficient documentation

## 2012-08-22 DIAGNOSIS — F329 Major depressive disorder, single episode, unspecified: Secondary | ICD-10-CM | POA: Insufficient documentation

## 2012-08-22 DIAGNOSIS — S069X0A Unspecified intracranial injury without loss of consciousness, initial encounter: Secondary | ICD-10-CM | POA: Insufficient documentation

## 2012-08-22 DIAGNOSIS — Z5181 Encounter for therapeutic drug level monitoring: Secondary | ICD-10-CM

## 2012-08-22 DIAGNOSIS — S069X9A Unspecified intracranial injury with loss of consciousness of unspecified duration, initial encounter: Secondary | ICD-10-CM

## 2012-08-22 DIAGNOSIS — R279 Unspecified lack of coordination: Secondary | ICD-10-CM | POA: Insufficient documentation

## 2012-08-22 DIAGNOSIS — E669 Obesity, unspecified: Secondary | ICD-10-CM | POA: Insufficient documentation

## 2012-08-22 DIAGNOSIS — M25569 Pain in unspecified knee: Secondary | ICD-10-CM | POA: Insufficient documentation

## 2012-08-22 DIAGNOSIS — G40909 Epilepsy, unspecified, not intractable, without status epilepticus: Secondary | ICD-10-CM | POA: Insufficient documentation

## 2012-08-22 DIAGNOSIS — X58XXXA Exposure to other specified factors, initial encounter: Secondary | ICD-10-CM | POA: Insufficient documentation

## 2012-08-22 DIAGNOSIS — M1710 Unilateral primary osteoarthritis, unspecified knee: Secondary | ICD-10-CM | POA: Insufficient documentation

## 2012-08-22 DIAGNOSIS — F3289 Other specified depressive episodes: Secondary | ICD-10-CM | POA: Insufficient documentation

## 2012-08-22 DIAGNOSIS — G8114 Spastic hemiplegia affecting left nondominant side: Secondary | ICD-10-CM

## 2012-08-22 MED ORDER — HYDROCODONE-ACETAMINOPHEN 7.5-325 MG PO TABS: 1.0000 | ORAL_TABLET | Freq: Three times a day (TID) | ORAL | Status: DC

## 2012-08-22 MED ORDER — VENLAFAXINE HCL 75 MG PO TABS: 75.0000 mg | ORAL_TABLET | Freq: Two times a day (BID) | ORAL | Status: DC

## 2012-08-22 NOTE — Patient Instructions (Addendum)
WORK ON RANGE OF MOTION AND EXERCISE AS YOU CAN TOLERATE.    EFFEXOR: TAKE ONCE DAILY FOR TWO WEEKS THEN INCREASE TO TWICE DAILY

## 2012-08-22 NOTE — Progress Notes (Signed)
Subjective:   Kendra [name] is back regarding her multiple pain complaints. Her right knee is bothering her as well as her left shoulder.  The pain is keeping her up at night. She was getting 7.5mg  hydrocodone earlier this year but this office reduced it to 5mg  which hasn't been as helpful. She's only gettting enough to take it about once per day.  She has tried engaging in activities to distract her mind from pain such as TV, word puzzles, activities outside the house, but all in all she is limited by her mobility. She doesn't have a computer at home.  When i confronted her today, Kendra [name] admitted to being depressed. Pain Inventory Average Pain 6 Pain Right Now 6 My pain is constant and dull  In the last 24 hours, has pain interfered with the following? General activity 8 Relation with others 2 Enjoyment of life 10 What TIME of day is your pain at its worst? in the morning and at night Sleep (in general) Poor  Pain is worse with: walking, bending, sitting and standing Pain improves with: heat/ice Relief from Meds: 5  Mobility walk with assistance use a cane how many minutes can you walk? 5 ability to climb steps?  no do you drive?  no use a wheelchair  Function disabled: date disabled 66 I need assistance with the following:  dressing, bathing, meal prep, household duties and shopping  Neuro/Psych weakness tingling trouble walking spasms dizziness anxiety  Prior Studies Any changes since last visit?  no  Physicians involved in your care Any changes since last visit?  no   Family History  Problem Relation Age of Onset  . Diabetes Mother   . Cancer Father   . Cancer Maternal Grandfather    History   Social History  . Marital Status: Single    Spouse Name: N/A    Number of Children: N/A  . Years of Education: N/A   Social History Main Topics  . Smoking status: Never Smoker   . Smokeless tobacco: None  . Alcohol Use: No  . Drug Use: No  . Sexually Active:     Other Topics Concern  . None   Social History Narrative  . None   Past Surgical History  Procedure Date  . Cholecystectomy   . Spine surgery   . Brain surgery    Past Medical History  Diagnosis Date  . Kidney stones   . Osteoarthritis   . Muscle pain   . Seizures   . Anxiety    BP 128/51  Pulse 78  Resp 18  Ht 5\' 4"  (1.626 m)  Wt 207 lb (93.895 kg)  BMI 35.53 kg/m2  SpO2 95%  LMP 12/31/2011    Patient ID: Kendra [name], female    DOB: 26-Aug-1961, 51 y.o.   MRN: [id number]  HPI    Review of Systems  Constitutional: Positive for fatigue.  Eyes: Negative.   Respiratory: Negative.   Cardiovascular: Negative.   Gastrointestinal: Negative.   Genitourinary: Negative.   Musculoskeletal: Positive for back pain and gait problem.  Skin: Negative.   Neurological: Positive for dizziness, tremors and seizures (controlled).  Hematological: Negative.   Psychiatric/Behavioral: Negative.        Objective:   Physical Exam Constitutional: She is oriented to person, place, and time. She appears well-developed and well-nourished.  HENT:  Head: Normocephalic.  Eyes: EOM are normal. Pupils are equal, round, and reactive to light.  Neck: Normal range of motion.  Cardiovascular: Normal rate and regular  rhythm.  Pulmonary/Chest: Effort normal and breath sounds normal.  Abdominal: Soft. She exhibits no distension. There is no tenderness.  Musculoskeletal:  Left shoulder with crepitus and pain with rotational,movements. inpingement manevers were positive. The shoulder isn't truly subluxed but there is a 3/4 inch gap between the acromium and humeral head. Right knee with crepitus with PROM and AROM.  Neurological: She is alert and oriented to person, place, and time.  Continued dsykinesias of the left upper ext. Does better when she is relaxed.  Speech slurred at baseline. Cognition is appropriate. She is alert.  Skin: Skin is warm.  Psychiatric: She has a normal mood and  affect. Her behavior is normal. Judgment and thought content normal.   Assessment & Plan:   ASSESSMENT:  1. 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 The  patient obviously with commitment rotator cuff tendinitis on the  left.  2. Seizure disorder.  3. Obesity.  4. Depression which I feel is affecting her pain levels at this point.   PLAN:  1. We discussed the fact that she needs to continue finding other things to help distract her from her pain, be it socially or spiritually.  2. After informed consent and preparation of the skin, i again injected the left shoulder with 40mg  kenalog and 3 cc 1% lidocaine Via lateral approach. Patient tolerated without any issues. A dressing was applied.  3. Initiate a trial of effexor for mood and pain, starting at 75mg  daily for two weeks then increase bid. 4. 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 #90.  5. Still Could consider fentanyl patch but it would require much closer monitoring through our office.  6.We will schedule her a 4 month followup.

## 2012-08-22 NOTE — Addendum Note (Signed)
Addended by: Judd Gaudier on: 08/22/2012 01:24 PM   Modules accepted: Orders

## 2012-09-23 ENCOUNTER — Other Ambulatory Visit: Payer: Self-pay | Admitting: Physical Medicine & Rehabilitation

## 2012-09-28 ENCOUNTER — Other Ambulatory Visit: Payer: Self-pay | Admitting: Physical Medicine & Rehabilitation

## 2012-10-20 ENCOUNTER — Other Ambulatory Visit: Payer: Self-pay | Admitting: Physical Medicine & Rehabilitation

## 2012-11-21 ENCOUNTER — Encounter: Payer: Self-pay | Admitting: Physical Medicine & Rehabilitation

## 2012-11-21 ENCOUNTER — Encounter: Payer: Medicare HMO | Attending: Physical Medicine & Rehabilitation | Admitting: Physical Medicine & Rehabilitation

## 2012-11-21 VITALS — BP 119/62 | HR 89 | Resp 14 | Ht 64.0 in | Wt 210.0 lb

## 2012-11-21 DIAGNOSIS — IMO0002 Reserved for concepts with insufficient information to code with codable children: Secondary | ICD-10-CM | POA: Insufficient documentation

## 2012-11-21 DIAGNOSIS — M1710 Unilateral primary osteoarthritis, unspecified knee: Secondary | ICD-10-CM

## 2012-11-21 DIAGNOSIS — M19019 Primary osteoarthritis, unspecified shoulder: Secondary | ICD-10-CM | POA: Insufficient documentation

## 2012-11-21 DIAGNOSIS — S069X9A Unspecified intracranial injury with loss of consciousness of unspecified duration, initial encounter: Secondary | ICD-10-CM

## 2012-11-21 DIAGNOSIS — G8110 Spastic hemiplegia affecting unspecified side: Secondary | ICD-10-CM | POA: Insufficient documentation

## 2012-11-21 DIAGNOSIS — M961 Postlaminectomy syndrome, not elsewhere classified: Secondary | ICD-10-CM | POA: Insufficient documentation

## 2012-11-21 DIAGNOSIS — G8114 Spastic hemiplegia affecting left nondominant side: Secondary | ICD-10-CM

## 2012-11-21 DIAGNOSIS — X58XXXA Exposure to other specified factors, initial encounter: Secondary | ICD-10-CM | POA: Insufficient documentation

## 2012-11-21 DIAGNOSIS — S069XAA Unspecified intracranial injury with loss of consciousness status unknown, initial encounter: Secondary | ICD-10-CM | POA: Insufficient documentation

## 2012-11-21 MED ORDER — DICLOFENAC SODIUM 1 % TD GEL: 1.0000 "application " | Freq: Four times a day (QID) | TRANSDERMAL | Status: DC

## 2012-11-21 MED ORDER — FENTANYL 12 MCG/HR TD PT72: 1.0000 | MEDICATED_PATCH | TRANSDERMAL | Status: DC

## 2012-11-21 MED ORDER — LEVETIRACETAM 500 MG PO TABS: 500.0000 mg | ORAL_TABLET | Freq: Three times a day (TID) | ORAL | Status: DC

## 2012-11-21 MED ORDER — HYDROCODONE-ACETAMINOPHEN 7.5-325 MG PO TABS: 1.0000 | ORAL_TABLET | Freq: Three times a day (TID) | ORAL | Status: DC

## 2012-11-21 NOTE — Progress Notes (Signed)
Subjective:    Patient ID: Kendra [name], female    DOB: [birth year], 52 y.o.   MRN: [id number]  HPI  Kendra [name] is back regarding her chronic pain issues. The left shoulder was helped by the injection but the pain has recurred over the last couple weeks. She reports that over the last month or so her back has bothered her more. 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 She feels that the cold weather has helped her pain to an extent.  She didn't tolerate the effexor and stopped it.    Pain Inventory Average Pain 9 Pain Right Now 7 My pain is intermittent  In the last 24 hours, has pain interfered with the following? General activity 7 Relation with others 5 Enjoyment of life 10 What TIME of day is your pain at its worst? varies Sleep (in general) Fair  Pain is worse with: sitting Pain improves with: medication Relief from Meds: 5  Mobility walk without assistance use a cane how many minutes can you walk? 5 ability to climb steps?  no do you drive?  no use a wheelchair needs help with transfers  Function disabled: date disabled 75 I need assistance with the following:  dressing, toileting, meal prep, household duties and shopping  Neuro/Psych weakness trouble walking spasms dizziness anxiety  Prior Studies Any changes since last visit?  no  Physicians involved in your care Simone Curia   Family History  Problem Relation Age of Onset  . Diabetes Mother   . Cancer Father   . Cancer Maternal Grandfather    History   Social History  . Marital Status: Single    Spouse Name: N/A    Number of Children: N/A  . Years of Education: N/A   Social History Main Topics  . Smoking status: Never Smoker   . Smokeless tobacco: None  . Alcohol Use: No  . Drug Use: No  . Sexually Active:    Other Topics Concern  . None   Social History Narrative  . None   Past Surgical History  Procedure Date  . Cholecystectomy   .  Spine surgery   . Brain surgery    Past Medical History  Diagnosis Date  . Kidney stones   . Osteoarthritis   . Muscle pain   . Seizures   . Anxiety    BP 119/62  Pulse 89  Resp 14  Ht 5\' 4"  (1.626 m)  Wt 210 lb (95.255 kg)  BMI 36.05 kg/m2  SpO2 94%  LMP 09/01/2012     Review of Systems  Constitutional: Positive for diaphoresis and unexpected weight change.  Respiratory: Positive for shortness of breath.   Cardiovascular: Positive for leg swelling.  Musculoskeletal: Positive for gait problem.  Neurological: Positive for dizziness and weakness.  Psychiatric/Behavioral: The patient is nervous/anxious.   All other systems reviewed and are negative.       Objective:   Physical Exam Constitutional: She is oriented to person, place, and time. She appears well-developed and well-nourished.  HENT:  Head: Normocephalic.  Eyes: EOM are normal. Pupils are equal, round, and reactive to light.  Neck: Normal range of motion.  Cardiovascular: Normal rate and regular rhythm.  Pulmonary/Chest: Effort normal and breath sounds normal.  Abdominal: Soft. She exhibits no distension. There is no tenderness.  Musculoskeletal:  Left shoulder with crepitus and pain with rotational,movements. inpingement manevers were positive. The shoulder isn't truly subluxed but there remains a  3/4 inch gap between the acromium and humeral head. Right knee with crepitus with PROM and AROM.  Neurological: She is alert and oriented to person, place, and time.  Continued intermittent dsykinesias of the left upper ext. Does better when she is relaxed.  Speech slurred at baseline. Cognition is appropriate. She is alert.  Skin: Skin is warm.  Psychiatric: She has a normal mood and affect. Her behavior is normal. Judgment and thought content normal.    Assessment & Plan:   ASSESSMENT:  1. 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 The  patient obviously with commitment rotator cuff tendinitis on the  left.  2. Seizure disorder.  3. Obesity.  4. Reactive depression.    PLAN:  1. We discussed the fact that she needs to continue finding other things to help distract her from her pain, be it socially or spiritually.  2. Will begin a trial of fentanyl patch 9mcg/hr. Reviewed placement and drug details. 3. Rx'ed voltaren gel to be use tid to knees/ shoulders 4. 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 #90 for sleep and movement disorder 5. Refilled hydrocodone today.  6.We will schedule her a 1 month followup. 30 minutes of face to face patient care time were spent during this visit. All questions were encouraged and answered.

## 2012-11-21 NOTE — Patient Instructions (Signed)
CALL ME WITH QUESTIONS 

## 2012-11-22 ENCOUNTER — Other Ambulatory Visit: Payer: Self-pay | Admitting: Physical Medicine & Rehabilitation

## 2012-12-20 ENCOUNTER — Encounter: Payer: Self-pay | Admitting: Physical Medicine & Rehabilitation

## 2012-12-20 ENCOUNTER — Encounter: Payer: Medicare HMO | Attending: Physical Medicine & Rehabilitation | Admitting: Physical Medicine & Rehabilitation

## 2012-12-20 VITALS — BP 121/83 | HR 92 | Ht 64.0 in | Wt 210.0 lb

## 2012-12-20 DIAGNOSIS — M961 Postlaminectomy syndrome, not elsewhere classified: Secondary | ICD-10-CM

## 2012-12-20 DIAGNOSIS — S069X0D Unspecified intracranial injury without loss of consciousness, subsequent encounter: Secondary | ICD-10-CM

## 2012-12-20 DIAGNOSIS — Z5189 Encounter for other specified aftercare: Secondary | ICD-10-CM

## 2012-12-20 DIAGNOSIS — M259 Joint disorder, unspecified: Secondary | ICD-10-CM | POA: Insufficient documentation

## 2012-12-20 DIAGNOSIS — E669 Obesity, unspecified: Secondary | ICD-10-CM | POA: Insufficient documentation

## 2012-12-20 DIAGNOSIS — M179 Osteoarthritis of knee, unspecified: Secondary | ICD-10-CM

## 2012-12-20 DIAGNOSIS — M7061 Trochanteric bursitis, right hip: Secondary | ICD-10-CM

## 2012-12-20 DIAGNOSIS — R279 Unspecified lack of coordination: Secondary | ICD-10-CM | POA: Insufficient documentation

## 2012-12-20 DIAGNOSIS — M25569 Pain in unspecified knee: Secondary | ICD-10-CM | POA: Insufficient documentation

## 2012-12-20 DIAGNOSIS — S069X0A Unspecified intracranial injury without loss of consciousness, initial encounter: Secondary | ICD-10-CM | POA: Insufficient documentation

## 2012-12-20 DIAGNOSIS — G8110 Spastic hemiplegia affecting unspecified side: Secondary | ICD-10-CM

## 2012-12-20 DIAGNOSIS — G40909 Epilepsy, unspecified, not intractable, without status epilepticus: Secondary | ICD-10-CM | POA: Insufficient documentation

## 2012-12-20 DIAGNOSIS — F329 Major depressive disorder, single episode, unspecified: Secondary | ICD-10-CM | POA: Insufficient documentation

## 2012-12-20 DIAGNOSIS — M25519 Pain in unspecified shoulder: Secondary | ICD-10-CM | POA: Insufficient documentation

## 2012-12-20 DIAGNOSIS — X58XXXA Exposure to other specified factors, initial encounter: Secondary | ICD-10-CM | POA: Insufficient documentation

## 2012-12-20 DIAGNOSIS — F3289 Other specified depressive episodes: Secondary | ICD-10-CM | POA: Insufficient documentation

## 2012-12-20 DIAGNOSIS — M76899 Other specified enthesopathies of unspecified lower limb, excluding foot: Secondary | ICD-10-CM

## 2012-12-20 DIAGNOSIS — M1710 Unilateral primary osteoarthritis, unspecified knee: Secondary | ICD-10-CM | POA: Insufficient documentation

## 2012-12-20 DIAGNOSIS — G8114 Spastic hemiplegia affecting left nondominant side: Secondary | ICD-10-CM

## 2012-12-20 NOTE — Progress Notes (Signed)
Subjective:    Patient ID: Kendra [name], female    DOB: [birth year], 52 y.o.   MRN: [id number]  HPI  [name] is back regarding her TBI and chronic pain. She was unable to tolerate even the lowest dose of the fentanyl patch. She is continuing to have pain in her low back with more pain in the right hip over the last month. She is finding the voltaren gel is helpful. Her mother helps her rub it into her joints, back, etc.   She continues on keppra for seizure prophylaxis, but the copay has gone up dramatically. She has been unable to tolerate the generic version, as she had a seizure while on it previously. As a whole, [name] is quite sensitive to most medications.   She is using a manual wheelchair within the home. She uses her pronged cane for shortdx. [place] tries to keep up with some basic stretching and exercises.  Pain Inventory Average Pain 10 Pain Right Now 10 My pain is stabbing  In the last 24 hours, has pain interfered with the following? General activity 9 Relation with others 8 Enjoyment of life 10 What TIME of day is your pain at its worst? varies Sleep (in general) Fair  Pain is worse with: walking, bending, sitting and standing Pain improves with: medication and injections Relief from Meds: 5  Mobility use a cane use a walker how many minutes can you walk? 5 ability to climb steps?  no do you drive?  no use a wheelchair needs help with transfers  Function disabled: date disabled see chart I need assistance with the following:  dressing, meal prep, household duties and shopping  Neuro/Psych weakness tingling trouble walking spasms dizziness anxiety  Prior Studies Any changes since last visit?  no  Physicians involved in your care Any changes since last visit?  no   Family History  Problem Relation Age of Onset  . Diabetes Mother   . Cancer Father   . Cancer Maternal Grandfather    History   Social History  . Marital Status: Single    Spouse  Name: N/A    Number of Children: N/A  . Years of Education: N/A   Social History Main Topics  . Smoking status: Never Smoker   . Smokeless tobacco: Never Used  . Alcohol Use: No  . Drug Use: No  . Sexually Active: None   Other Topics Concern  . None   Social History Narrative  . None   Past Surgical History  Procedure Laterality Date  . Cholecystectomy    . Spine surgery    . Brain surgery     Past Medical History  Diagnosis Date  . Kidney stones   . Osteoarthritis   . Muscle pain   . Seizures   . Anxiety    BP 121/83  Pulse 92  Ht 5\' 4"  (1.626 m)  Wt 210 lb (95.255 kg)  BMI 36.03 kg/m2  SpO2 96%  LMP 09/01/2012    Review of Systems  Constitutional: Positive for unexpected weight change.  Respiratory: Positive for shortness of breath.   Cardiovascular: Positive for leg swelling.  Musculoskeletal: Positive for gait problem.       Spasms   Neurological: Positive for dizziness and weakness.       Tingling  Psychiatric/Behavioral: The patient is nervous/anxious.   All other systems reviewed and are negative.       Objective:   Physical Exam Constitutional: She is oriented to person, place, and time.  She appears well-developed and well-nourished.  HENT:  Head: Normocephalic.  Eyes: EOM are normal. Pupils are equal, round, and reactive to light.  Neck: Normal range of motion.  Cardiovascular: Normal rate and regular rhythm.  Pulmonary/Chest: Effort normal and breath sounds normal.  Abdominal: Soft. She exhibits no distension. There is no tenderness.  Musculoskeletal:  Left shoulder with crepitus and pain with rotational,movements. inpingement manevers were positive. The shoulder isn't truly subluxed but there remains a 3/4 inch gap between the acromium and humeral head. Right knee with crepitus with PROM and AROM. Right greater troch very tender with palpation. Right lumbar spine at op site tender to deep palpation. Neurological: She is alert and oriented  to person, place, and time.  Continued intermittent dsykinesias of the left upper ext. Does better when she is relaxed.  Speech slurred at baseline. Cognition is appropriate. She is alert.  Skin: Skin is warm.  Psychiatric: She has a normal mood and affect. Her behavior is normal. Judgment and thought content normal. She is pleasant as always.  Assessment & Plan:   ASSESSMENT:  1. 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 The  patient obviously with commitment rotator cuff tendinitis on the  left.  2. Seizure disorder.  3. Obesity.  4. Reactive depression.  5. Right greater trochanteric bursitis  PLAN:  1. We discussed the fact that she needs to continue finding other things to help distract her from her pain, be it socially or spiritually.  2. After informed consent I injected her right greater trochanter with 40mg  kenalog anc 3cc 1% lidocaine. She tolerated well. Continue with stretching and appropriate posture.  3. Rx'ed voltaren gel to be use tid to knees/ shoulders  4. 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 No hydrocodone was needed today.  6.We will schedule her a 4 month followup. 30 minutes of face to face patient care time were spent during this visit. All questions were encouraged and answered.

## 2012-12-20 NOTE — Patient Instructions (Signed)
WORK ON WAYS TO DISTRACT YOUR MIND FROM YOUR PAIN!  CONTINUE TO WORK ON YOUR POSTURE, STRETCHING, STRENGTH

## 2013-01-16 ENCOUNTER — Other Ambulatory Visit: Payer: Self-pay | Admitting: Physical Medicine & Rehabilitation

## 2013-03-13 ENCOUNTER — Other Ambulatory Visit: Payer: Self-pay | Admitting: Physical Medicine & Rehabilitation

## 2013-03-19 ENCOUNTER — Other Ambulatory Visit: Payer: Self-pay | Admitting: Physical Medicine & Rehabilitation

## 2013-04-18 ENCOUNTER — Encounter: Payer: Self-pay | Admitting: Physical Medicine & Rehabilitation

## 2013-04-18 ENCOUNTER — Encounter: Payer: Medicare HMO | Attending: Physical Medicine & Rehabilitation | Admitting: Physical Medicine & Rehabilitation

## 2013-04-18 VITALS — BP 96/57 | HR 94 | Resp 16 | Ht 64.0 in | Wt 210.0 lb

## 2013-04-18 DIAGNOSIS — M25559 Pain in unspecified hip: Secondary | ICD-10-CM | POA: Insufficient documentation

## 2013-04-18 DIAGNOSIS — F341 Dysthymic disorder: Secondary | ICD-10-CM | POA: Insufficient documentation

## 2013-04-18 DIAGNOSIS — S069XAS Unspecified intracranial injury with loss of consciousness status unknown, sequela: Secondary | ICD-10-CM | POA: Insufficient documentation

## 2013-04-18 DIAGNOSIS — W19XXXA Unspecified fall, initial encounter: Secondary | ICD-10-CM | POA: Insufficient documentation

## 2013-04-18 DIAGNOSIS — M19019 Primary osteoarthritis, unspecified shoulder: Secondary | ICD-10-CM

## 2013-04-18 DIAGNOSIS — X58XXXS Exposure to other specified factors, sequela: Secondary | ICD-10-CM | POA: Insufficient documentation

## 2013-04-18 DIAGNOSIS — R279 Unspecified lack of coordination: Secondary | ICD-10-CM | POA: Insufficient documentation

## 2013-04-18 DIAGNOSIS — S93401A Sprain of unspecified ligament of right ankle, initial encounter: Secondary | ICD-10-CM

## 2013-04-18 DIAGNOSIS — M961 Postlaminectomy syndrome, not elsewhere classified: Secondary | ICD-10-CM

## 2013-04-18 DIAGNOSIS — G894 Chronic pain syndrome: Secondary | ICD-10-CM | POA: Insufficient documentation

## 2013-04-18 DIAGNOSIS — S069X9S Unspecified intracranial injury with loss of consciousness of unspecified duration, sequela: Secondary | ICD-10-CM

## 2013-04-18 DIAGNOSIS — S069X0S Unspecified intracranial injury without loss of consciousness, sequela: Secondary | ICD-10-CM

## 2013-04-18 DIAGNOSIS — G8114 Spastic hemiplegia affecting left nondominant side: Secondary | ICD-10-CM

## 2013-04-18 DIAGNOSIS — M25579 Pain in unspecified ankle and joints of unspecified foot: Secondary | ICD-10-CM | POA: Insufficient documentation

## 2013-04-18 DIAGNOSIS — S93409A Sprain of unspecified ligament of unspecified ankle, initial encounter: Secondary | ICD-10-CM | POA: Insufficient documentation

## 2013-04-18 DIAGNOSIS — E669 Obesity, unspecified: Secondary | ICD-10-CM | POA: Insufficient documentation

## 2013-04-18 DIAGNOSIS — M7061 Trochanteric bursitis, right hip: Secondary | ICD-10-CM

## 2013-04-18 DIAGNOSIS — G40909 Epilepsy, unspecified, not intractable, without status epilepticus: Secondary | ICD-10-CM | POA: Insufficient documentation

## 2013-04-18 DIAGNOSIS — M76899 Other specified enthesopathies of unspecified lower limb, excluding foot: Secondary | ICD-10-CM

## 2013-04-18 DIAGNOSIS — G8110 Spastic hemiplegia affecting unspecified side: Secondary | ICD-10-CM

## 2013-04-18 DIAGNOSIS — M129 Arthropathy, unspecified: Secondary | ICD-10-CM | POA: Insufficient documentation

## 2013-04-18 MED ORDER — KETOROLAC TROMETHAMINE 60 MG/2ML IM SOLN
60.0000 mg | Freq: Once | INTRAMUSCULAR | Status: AC
  Administered 2013-04-18: 60 mg via INTRAMUSCULAR

## 2013-04-18 MED ORDER — HYDROCODONE-ACETAMINOPHEN 7.5-325 MG PO TABS: ORAL_TABLET | ORAL | Status: DC

## 2013-04-18 MED ORDER — PREDNISONE 20 MG PO TABS: 20.0000 mg | ORAL_TABLET | Freq: Every day | ORAL | Status: DC

## 2013-04-18 NOTE — Progress Notes (Signed)
Subjective:    Patient ID: Kendra [name], female    DOB: 1960/11/17, 52 y.o.   MRN: [id number]  HPI  Kendra [name] is back regarding her chronic pain syndrome. She continues to struggle with her same issues. Unfortunately, about a week ago, she was getting up in the bathroom to get on her walker when she became dizzy and fell. She fell on her right leg and scraped her hip. She also twisted her right ankle. The ankle and hip continue to be painful. She's noted swelling on the right ankle.  For the most part Kendra [name] is using her powered wheel chair. She uses her walker still however for short dx'es within the house. After her fall, mother had the bathroom door removed so that she can no get her wheelchair in the bathroom.  Pain Inventory Average Pain 7 Pain Right Now 9 My pain is constant  In the last 24 hours, has pain interfered with the following? General activity 10 Relation with others 3 Enjoyment of life 4 What TIME of day is your pain at its worst? evening and night Sleep (in general) Fair  Pain is worse with: walking, bending and standing Pain improves with: rest and heat/ice Relief from Meds: 5  Mobility use a cane how many minutes can you walk? 5 ability to climb steps?  no do you drive?  no use a wheelchair needs help with transfers Do you have any goals in this area?  yes  Function disabled: date disabled . I need assistance with the following:  dressing, bathing, toileting, meal prep, household duties and shopping Do you have any goals in this area?  yes  Neuro/Psych trouble walking spasms dizziness anxiety  Prior Studies CT/MRI  Physicians involved in your care Any changes since last visit?  no   Family History  Problem Relation Age of Onset  . Diabetes Mother   . Cancer Father   . Cancer Maternal Grandfather    History   Social History  . Marital Status: Single    Spouse Name: N/A    Number of Children: N/A  . Years of Education: N/A   Social  History Main Topics  . Smoking status: Never Smoker   . Smokeless tobacco: Never Used  . Alcohol Use: No  . Drug Use: No  . Sexually Active: None   Other Topics Concern  . None   Social History Narrative  . None   Past Surgical History  Procedure Laterality Date  . Cholecystectomy    . Spine surgery    . Brain surgery     Past Medical History  Diagnosis Date  . Kidney stones   . Osteoarthritis   . Muscle pain   . Seizures   . Anxiety    BP 96/57  Pulse 94  Resp 16  Ht 5\' 4"  (1.626 m)  Wt 210 lb (95.255 kg)  BMI 36.03 kg/m2  SpO2 92%  LMP 09/01/2012     Review of Systems  Constitutional: Positive for unexpected weight change.  Respiratory: Positive for shortness of breath.   Cardiovascular: Positive for leg swelling.  Musculoskeletal: Positive for back pain and gait problem.  Neurological: Positive for dizziness.  Psychiatric/Behavioral: The patient is nervous/anxious.   All other systems reviewed and are negative.       Objective:   Physical Exam  Constitutional: She is oriented to person, place, and time. She appears well-developed and well-nourished.  HENT:  Head: Normocephalic.  Eyes: EOM are normal. Pupils are equal, round,  and reactive to light.  Neck: Normal range of motion.  Cardiovascular: Normal rate and regular rhythm.  Pulmonary/Chest: Effort normal and breath sounds normal.  Abdominal: Soft. She exhibits no distension. There is no tenderness.  Musculoskeletal:  Left shoulder with crepitus and pain with rotational,movements. inpingement manevers were positive. The shoulder isn't truly subluxed but there remains a 3/4 inch gap between the acromium and humeral head. Right knee with crepitus with PROM and AROM. Right greater troch very tender with palpation. Her right hip was mildly tender. There was a small abrasion noted without bruising. She had pain with palpation over her lateral right ankle with mild swelling. Inversion of the ankle did  cause pain.  Neurological: She is alert and oriented to person, place, and time.  Continued intermittent dsykinesias of the left upper ext. Does better when she is relaxed.  Speech slurred at baseline. Cognition is appropriate. She is alert.  Skin: Skin is warm.  Psychiatric: She has a normal mood and affect. Her behavior is normal. Judgment and thought content normal. She is pleasant as always.    Assessment & Plan:   ASSESSMENT:  1. 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 The  patient obviously with commitment rotator cuff tendinitis on the  left.  2. Seizure disorder.  3. Obesity.  4. Reactive depression.  5. Right greater trochanteric bursitis  6. Right ankle sprain after fall.  PLAN:  1. For acute pain today, we injected 60mg  toradol IM. I also wrote her an rx for prednisone taper to help with her multiple joint aches and acute ankle sprain pain.  2. Discussed wrapping of right ankle, use of high top shoes, etc. Also encouraged ice and immobilization. More than anything else today we discussed realistic expectations, safety at home, etc. This is ongoing theme with Kendra [name] unfortunately. 3.  voltaren gel to be use tid to knees/ shoulders  4. 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 hydrocodone was needed today. --rx provided 6.We will schedule her a 4 month followup. 30 minutes of face to face patient care time were spent during this visit. All questions were encouraged and answered.

## 2013-04-18 NOTE — Patient Instructions (Signed)
YOU NEED TO PRACTICE SAFE TRANSFER AND MOBILITY TECHNIQUE!!!!!   TAKE YOUR TIME!!!!

## 2013-05-08 ENCOUNTER — Other Ambulatory Visit: Payer: Self-pay | Admitting: Physical Medicine & Rehabilitation

## 2013-06-21 ENCOUNTER — Other Ambulatory Visit: Payer: Self-pay | Admitting: Physical Medicine & Rehabilitation

## 2013-07-01 ENCOUNTER — Other Ambulatory Visit: Payer: Self-pay | Admitting: Physical Medicine & Rehabilitation

## 2013-07-23 ENCOUNTER — Telehealth: Payer: Self-pay

## 2013-07-23 NOTE — Telephone Encounter (Signed)
Patient requesting a refill on Hydrocodone.  °

## 2013-07-23 NOTE — Telephone Encounter (Signed)
Appointment made for patient to come to office to discuss hydrocodone refill.

## 2013-07-24 ENCOUNTER — Encounter: Payer: Self-pay | Admitting: Physical Medicine and Rehabilitation

## 2013-07-24 ENCOUNTER — Encounter: Payer: Medicare HMO | Attending: Physical Medicine and Rehabilitation | Admitting: Physical Medicine and Rehabilitation

## 2013-07-24 VITALS — BP 124/64 | HR 68 | Resp 14 | Ht 64.0 in | Wt 210.0 lb

## 2013-07-24 DIAGNOSIS — M1710 Unilateral primary osteoarthritis, unspecified knee: Secondary | ICD-10-CM | POA: Insufficient documentation

## 2013-07-24 DIAGNOSIS — M19019 Primary osteoarthritis, unspecified shoulder: Secondary | ICD-10-CM | POA: Insufficient documentation

## 2013-07-24 DIAGNOSIS — F341 Dysthymic disorder: Secondary | ICD-10-CM | POA: Insufficient documentation

## 2013-07-24 DIAGNOSIS — S069X9S Unspecified intracranial injury with loss of consciousness of unspecified duration, sequela: Secondary | ICD-10-CM

## 2013-07-24 DIAGNOSIS — M67919 Unspecified disorder of synovium and tendon, unspecified shoulder: Secondary | ICD-10-CM | POA: Insufficient documentation

## 2013-07-24 DIAGNOSIS — G40909 Epilepsy, unspecified, not intractable, without status epilepticus: Secondary | ICD-10-CM | POA: Insufficient documentation

## 2013-07-24 DIAGNOSIS — M25579 Pain in unspecified ankle and joints of unspecified foot: Secondary | ICD-10-CM | POA: Insufficient documentation

## 2013-07-24 DIAGNOSIS — M19079 Primary osteoarthritis, unspecified ankle and foot: Secondary | ICD-10-CM | POA: Insufficient documentation

## 2013-07-24 DIAGNOSIS — G894 Chronic pain syndrome: Secondary | ICD-10-CM | POA: Insufficient documentation

## 2013-07-24 DIAGNOSIS — M25519 Pain in unspecified shoulder: Secondary | ICD-10-CM | POA: Insufficient documentation

## 2013-07-24 DIAGNOSIS — R279 Unspecified lack of coordination: Secondary | ICD-10-CM | POA: Insufficient documentation

## 2013-07-24 DIAGNOSIS — M719 Bursopathy, unspecified: Secondary | ICD-10-CM | POA: Insufficient documentation

## 2013-07-24 DIAGNOSIS — S069X0S Unspecified intracranial injury without loss of consciousness, sequela: Secondary | ICD-10-CM

## 2013-07-24 DIAGNOSIS — G8110 Spastic hemiplegia affecting unspecified side: Secondary | ICD-10-CM

## 2013-07-24 DIAGNOSIS — G8114 Spastic hemiplegia affecting left nondominant side: Secondary | ICD-10-CM

## 2013-07-24 MED ORDER — HYDROCODONE-ACETAMINOPHEN 7.5-325 MG PO TABS: ORAL_TABLET | ORAL | Status: DC

## 2013-07-24 NOTE — Progress Notes (Signed)
Subjective:    Patient ID: Kendra [name], female    DOB: [birth year], 52 y.o.   MRN: [id number]  HPI Kendra [name] is back regarding her chronic pain syndrome. She continues to struggle with the same issues. She complains about moderate pain in her knees bilateral, in her ankles and left shoulder.   For the most part Kendra [name] is using her powered wheel chair. She uses her walker only for very short distances in the house. She reports that she is exercising in her bed every day.  The problem has been stable.  Pain Inventory Average Pain 8 Pain Right Now 8 My pain is sharp, stabbing and aching  In the last 24 hours, has pain interfered with the following? General activity 7 Relation with others 6 Enjoyment of life 9 What TIME of day is your pain at its worst? n/a Sleep (in general) Fair  Pain is worse with: walking and standing Pain improves with: medication and injections Relief from Meds: 7  Mobility use a cane how many minutes can you walk? 2 ability to climb steps?  no do you drive?  no use a wheelchair needs help with transfers Do you have any goals in this area?  yes  Function disabled: date disabled . I need assistance with the following:  dressing, bathing, toileting, meal prep, household duties and shopping  Neuro/Psych trouble walking spasms dizziness anxiety  Prior Studies Any changes since last visit?  no  Physicians involved in your care Any changes since last visit?  no   Family History  Problem Relation Age of Onset  . Diabetes Mother   . Cancer Father   . Cancer Maternal Grandfather    History   Social History  . Marital Status: Single    Spouse Name: N/A    Number of Children: N/A  . Years of Education: N/A   Social History Main Topics  . Smoking status: Never Smoker   . Smokeless tobacco: Never Used  . Alcohol Use: No  . Drug Use: No  . Sexual Activity: None   Other Topics Concern  . None   Social History Narrative  . None   Past  Surgical History  Procedure Laterality Date  . Cholecystectomy    . Spine surgery    . Brain surgery     Past Medical History  Diagnosis Date  . Kidney stones   . Osteoarthritis   . Muscle pain   . Seizures   . Anxiety    BP 124/64  Pulse 68  Resp 14  Ht 5\' 4"  (1.626 m)  Wt 210 lb (95.255 kg)  BMI 36.03 kg/m2  SpO2 95%  LMP 09/01/2012     Review of Systems  Musculoskeletal: Positive for gait problem.  Neurological: Positive for dizziness.  Psychiatric/Behavioral: The patient is nervous/anxious.   All other systems reviewed and are negative.       Objective:   Physical Exam Constitutional: She is oriented to person, place, and time. She appears well-developed and well-nourished.  HENT:  Head: Normocephalic.   Neck: Normal range of motion.   Musculoskeletal:  Left shoulder with crepitus and pain with rotational,movements. inpingement manevers were positive. The shoulder isn't truly subluxed but there remains a 3/4 inch gap between the acromium and humeral head.Abduction of left shoulder 70 degrees, passive.  Right knee with crepitus with PROM and AROM.  Muscle testing reveals 5/5 muscle strength of the upper extremity, and 5/5 of the lower extremity, except left UE 2-3/5, left LE 4/5,  tibialis anterior not tested today. Full range of motion in upper and lower extremities, except left shoulder restricted see above. Patient in  wheelchair . Continued intermittent dsykinesias of the left upper ext. Does better when she is relaxed.  Speech slurred at baseline. Cognition is appropriate. She is alert.  Skin: Skin is warm.  Psychiatric: She has a normal mood and affect. Her behavior is normal. Judgment and thought content normal. She is pleasant as always.         Assessment & Plan:  1. Traumatic brain injury, 1972, with persistent dyskinesias on the left,  arthritis involving both ankles, knees, and left shoulder. The  patient obviously with commitment rotator  cuff tendinitis on the  left.  2. Seizure disorder.  3. Obesity.  4. Reactive depression, although very cheerful today.  5. Left shoulder pain 6. Right ankle pain  PLAN:  1.Continue with staying safe in daily activities 2. Continue with exercises in the bed, if this is safe 3. voltaren gel to be use tid to knees/ shoulders , ankle 4. 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 hydrocodone was needed today. --rx provided  6.We will schedule her a 1 month followup. 25 minutes of face to face patient care time were spent during this visit. All questions were encouraged and answered.   Patient aware of new hydrocodone policy.  Letter given.

## 2013-07-24 NOTE — Patient Instructions (Signed)
Continue with your exercise program 

## 2013-08-20 ENCOUNTER — Encounter: Payer: Self-pay | Admitting: Physical Medicine & Rehabilitation

## 2013-08-20 ENCOUNTER — Encounter: Payer: Medicare HMO | Attending: Physical Medicine & Rehabilitation | Admitting: Physical Medicine & Rehabilitation

## 2013-08-20 VITALS — BP 100/56 | HR 64 | Resp 14 | Ht 64.0 in

## 2013-08-20 DIAGNOSIS — M1710 Unilateral primary osteoarthritis, unspecified knee: Secondary | ICD-10-CM | POA: Insufficient documentation

## 2013-08-20 DIAGNOSIS — F341 Dysthymic disorder: Secondary | ICD-10-CM | POA: Insufficient documentation

## 2013-08-20 DIAGNOSIS — M961 Postlaminectomy syndrome, not elsewhere classified: Secondary | ICD-10-CM

## 2013-08-20 DIAGNOSIS — M67919 Unspecified disorder of synovium and tendon, unspecified shoulder: Secondary | ICD-10-CM | POA: Insufficient documentation

## 2013-08-20 DIAGNOSIS — R279 Unspecified lack of coordination: Secondary | ICD-10-CM | POA: Insufficient documentation

## 2013-08-20 DIAGNOSIS — M179 Osteoarthritis of knee, unspecified: Secondary | ICD-10-CM

## 2013-08-20 DIAGNOSIS — S069X0S Unspecified intracranial injury without loss of consciousness, sequela: Secondary | ICD-10-CM

## 2013-08-20 DIAGNOSIS — G40909 Epilepsy, unspecified, not intractable, without status epilepticus: Secondary | ICD-10-CM | POA: Insufficient documentation

## 2013-08-20 DIAGNOSIS — G8110 Spastic hemiplegia affecting unspecified side: Secondary | ICD-10-CM

## 2013-08-20 DIAGNOSIS — M25579 Pain in unspecified ankle and joints of unspecified foot: Secondary | ICD-10-CM | POA: Insufficient documentation

## 2013-08-20 DIAGNOSIS — G8114 Spastic hemiplegia affecting left nondominant side: Secondary | ICD-10-CM

## 2013-08-20 DIAGNOSIS — M19019 Primary osteoarthritis, unspecified shoulder: Secondary | ICD-10-CM | POA: Insufficient documentation

## 2013-08-20 DIAGNOSIS — S069X9S Unspecified intracranial injury with loss of consciousness of unspecified duration, sequela: Secondary | ICD-10-CM

## 2013-08-20 DIAGNOSIS — M25519 Pain in unspecified shoulder: Secondary | ICD-10-CM | POA: Insufficient documentation

## 2013-08-20 DIAGNOSIS — M19079 Primary osteoarthritis, unspecified ankle and foot: Secondary | ICD-10-CM | POA: Insufficient documentation

## 2013-08-20 DIAGNOSIS — M719 Bursopathy, unspecified: Secondary | ICD-10-CM | POA: Insufficient documentation

## 2013-08-20 DIAGNOSIS — I951 Orthostatic hypotension: Secondary | ICD-10-CM

## 2013-08-20 DIAGNOSIS — G894 Chronic pain syndrome: Secondary | ICD-10-CM | POA: Insufficient documentation

## 2013-08-20 MED ORDER — HYDROCODONE-ACETAMINOPHEN 7.5-325 MG PO TABS: ORAL_TABLET | ORAL | Status: DC

## 2013-08-20 MED ORDER — METOPROLOL SUCCINATE ER 50 MG PO TB24: 50.0000 mg | ORAL_TABLET | Freq: Every day | ORAL | Status: DC

## 2013-08-20 NOTE — Progress Notes (Signed)
Subjective:    Patient ID: Kendra [name], female    DOB: [birth year], 52 y.o.   MRN: [id number]  HPI  Kendra [name] is back regarding her chronic pain and balance issues. She has had more falls over the last few months. She reports feeling dizzy when she stands up, and this often leads to her losing her balance. She is trying to use good safety strategies. Her powered wheelchair is her main mode of transportation at home.  Pain levels remain about the same.   Pain Inventory Average Pain 7 Pain Right Now 8 My pain is sharp, stabbing and aching  In the last 24 hours, has pain interfered with the following? General activity 7 Relation with others 6 Enjoyment of life 10 What TIME of day is your pain at its worst? evening and night Sleep (in general) Poor  Pain is worse with: walking, bending, sitting and standing Pain improves with: medication and injections Relief from Meds: 6  Mobility use a wheelchair needs help with transfers  Function disabled: date disabled . I need assistance with the following:  dressing, bathing, toileting, meal prep, household duties and shopping  Neuro/Psych weakness trouble walking spasms dizziness anxiety  Prior Studies Any changes since last visit?  no  Physicians involved in your care Any changes since last visit?  no   Family History  Problem Relation Age of Onset  . Diabetes Mother   . Cancer Father   . Cancer Maternal Grandfather    History   Social History  . Marital Status: Single    Spouse Name: N/A    Number of Children: N/A  . Years of Education: N/A   Social History Main Topics  . Smoking status: Never Smoker   . Smokeless tobacco: Never Used  . Alcohol Use: No  . Drug Use: No  . Sexual Activity: None   Other Topics Concern  . None   Social History Narrative  . None   Past Surgical History  Procedure Laterality Date  . Cholecystectomy    . Spine surgery    . Brain surgery     Past Medical History  Diagnosis  Date  . Kidney stones   . Osteoarthritis   . Muscle pain   . Seizures   . Anxiety    BP 100/56  Pulse 64  Resp 14  Ht 5\' 4"  (1.626 m)  SpO2 95%  LMP 09/01/2012     Review of Systems  Respiratory: Positive for shortness of breath.   Musculoskeletal: Positive for gait problem.       Spasm  Neurological: Positive for dizziness and weakness.  Psychiatric/Behavioral: The patient is nervous/anxious.   All other systems reviewed and are negative.       Objective:   Physical Exam  Constitutional: She is oriented to person, place, and time. She appears well-developed and well-nourished.  HENT:  Head: Normocephalic.  Eyes: EOM are normal. Pupils are equal, round, and reactive to light.  Neck: Normal range of motion.  Cardiovascular: Normal rate and regular rhythm.  Pulmonary/Chest: Effort normal and breath sounds normal.  Abdominal: Soft. She exhibits no distension. There is no tenderness.  Musculoskeletal:  Left shoulder with crepitus and pain with rotational,movements. inpingement manevers were positive. The shoulder isn't truly subluxed but there remains a 3/4 inch gap between the acromium and humeral head. Right knee with crepitus with PROM and AROM. Right greater troch very tender with palpation. Her right hip was mildly tender. There was a small abrasion noted without  bruising. She had pain with palpation over her lateral right ankle with mild swelling. Inversion of the ankle did cause pain.  Neurological: She is alert and oriented to person, place, and time.  Continued intermittent dsykinesias of the left upper ext.  Minimal resting tone is seen.  Speech slurred at baseline. Cognition is appropriate. She is alert.  Skin: Skin is warm.  Psychiatric: She has a normal mood and affect. Her behavior is normal. Judgment and thought content normal. She is pleasant as always.    Assessment & Plan:   ASSESSMENT:  1. Traumatic brain injury with persistent dyskinesias on the  left,  arthritis involving both ankles, knees, and left shoulder. The  patient obviously with commitment rotator cuff tendinitis on the  left.  2. Seizure disorder.  3. Obesity.  4. Reactive depression.  5. Right greater trochanteric bursitis  6. Right ankle sprain after fall.  7. Hypotension/ orthostatic hypotension  PLAN:  1. Information was rprovided on orthostasis. For now, we will decrease her toprol xl to 50mg  daily. she will continue on her tekturna. Encouraged fluids and perhaps compression stockings.   2. Reviewed safety awareness once again. 3. voltaren gel to be use tid to knees/ shoulders  4. 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 hydrocodone was needed today. --rx provided with another rx for next month 6.We will schedule her a 2 month followup. 30 minutes of face to face patient care time were spent during this visit. All questions were encouraged and answered.

## 2013-08-20 NOTE — Patient Instructions (Signed)
Orthostatic Hypotension °Orthostatic hypotension is a sudden fall in blood pressure. It occurs when a person goes from a sitting or lying position to a standing position. °CAUSES  °· Loss of body fluids (dehydration). °· Medicines that lower blood pressure. °· Sudden changes in posture, such as sudden standing when you have been sitting or lying down. °· Taking too much of your medicine. °SYMPTOMS  °· Lightheadedness or dizziness. °· Fainting or near-fainting. °· A fast heart rate (tachycardia). °· Weakness. °· Feeling tired (fatigue). °DIAGNOSIS  °Your caregiver may find the cause of orthostatic hypotension through: °· A history and/or physical exam. °· Checking your blood pressure. Your caregiver will check your blood pressure when you are: °· Lying down. °· Sitting. °· Standing. °· Tilt table testing. In this test, you are placed on a table that goes from a lying position to a standing position. You will be strapped to the table. This test helps to monitor your blood pressure and heart rate when you are in different positions. °TREATMENT  °· If orthostatic hypotension is caused by your medicines, your caregiver will need to adjust your dosage. Do not stop or adjust your medicine on your own. °· When changing positions, make these changes slowly. This allows your body to adjust to the different position. °· Compression stockings that are worn on your lower legs may be helpful. °· Your caregiver may have you consume extra salt. Do not add extra salt to your diet unless directed by your caregiver. °· Eat frequent, small meals. Avoid sudden standing after eating. °· Avoid hot showers or excessive heat. °· Your caregiver may give you fluids through the vein (intravenous). °· Your caregiver may put you on medicine to help enhance fluid retention. °SEEK IMMEDIATE MEDICAL CARE IF:  °· You faint or have a near-fainting episode. Call your local emergency services (911 in [place] °· You have or develop chest pain. °· You  feel sick to your stomach (nauseous) or vomit. °· You have a loss of feeling or movement in your arms or legs. °· You have difficulty talking, slurred speech, or you are unable to talk. °· You have difficulty thinking or have confused thinking. °MAKE SURE YOU:  °· Understand these instructions. °· Will watch your condition. °· Will get help right away if you are not doing well or get worse. °Document Released: 09/24/2002 Document Revised: 12/27/2011 Document Reviewed: 01/17/2009 °[employer]® Patient Information ©2014 [employer]. ° °

## 2013-09-28 ENCOUNTER — Other Ambulatory Visit: Payer: Self-pay | Admitting: Physical Medicine & Rehabilitation

## 2013-10-01 ENCOUNTER — Other Ambulatory Visit: Payer: Self-pay | Admitting: Physical Medicine & Rehabilitation

## 2013-10-03 NOTE — Telephone Encounter (Signed)
Patient is taking xanax and need clonazepam refill.  Last refilled 04/2013.  Please advise.

## 2013-10-03 NOTE — Telephone Encounter (Signed)
Klonopin refill can be called in with 3 rf

## 2013-10-03 NOTE — Telephone Encounter (Signed)
Klonopin refilled.

## 2013-10-18 HISTORY — PX: OTHER SURGICAL HISTORY: SHX169

## 2013-10-19 ENCOUNTER — Ambulatory Visit: Payer: Medicare HMO | Admitting: Physical Medicine & Rehabilitation

## 2013-10-23 ENCOUNTER — Encounter: Payer: Medicare HMO | Attending: Physical Medicine & Rehabilitation | Admitting: Physical Medicine & Rehabilitation

## 2013-10-23 ENCOUNTER — Ambulatory Visit
Admission: RE | Admit: 2013-10-23 | Discharge: 2013-10-23 | Disposition: A | Discharge status: Home / Self Care | Payer: Commercial Managed Care - HMO | Source: Ambulatory Visit | Attending: Physical Medicine & Rehabilitation | Admitting: Physical Medicine & Rehabilitation

## 2013-10-23 ENCOUNTER — Encounter: Payer: Self-pay | Admitting: Physical Medicine & Rehabilitation

## 2013-10-23 VITALS — BP 123/77 | HR 86 | Resp 14 | Ht 64.0 in | Wt 204.6 lb

## 2013-10-23 DIAGNOSIS — S069XAS Unspecified intracranial injury with loss of consciousness status unknown, sequela: Secondary | ICD-10-CM

## 2013-10-23 DIAGNOSIS — G8110 Spastic hemiplegia affecting unspecified side: Secondary | ICD-10-CM

## 2013-10-23 DIAGNOSIS — IMO0002 Reserved for concepts with insufficient information to code with codable children: Secondary | ICD-10-CM

## 2013-10-23 DIAGNOSIS — M961 Postlaminectomy syndrome, not elsewhere classified: Secondary | ICD-10-CM | POA: Insufficient documentation

## 2013-10-23 DIAGNOSIS — S069X0S Unspecified intracranial injury without loss of consciousness, sequela: Secondary | ICD-10-CM

## 2013-10-23 DIAGNOSIS — G8114 Spastic hemiplegia affecting left nondominant side: Secondary | ICD-10-CM

## 2013-10-23 DIAGNOSIS — M179 Osteoarthritis of knee, unspecified: Secondary | ICD-10-CM

## 2013-10-23 DIAGNOSIS — M1710 Unilateral primary osteoarthritis, unspecified knee: Secondary | ICD-10-CM

## 2013-10-23 DIAGNOSIS — M19019 Primary osteoarthritis, unspecified shoulder: Secondary | ICD-10-CM

## 2013-10-23 DIAGNOSIS — S069X9S Unspecified intracranial injury with loss of consciousness of unspecified duration, sequela: Secondary | ICD-10-CM

## 2013-10-23 MED ORDER — HYDROCODONE-ACETAMINOPHEN 7.5-325 MG PO TABS: ORAL_TABLET | ORAL | Status: DC

## 2013-10-23 MED ORDER — GABAPENTIN 100 MG PO CAPS: 100.0000 mg | ORAL_CAPSULE | Freq: Three times a day (TID) | ORAL | Status: DC

## 2013-10-23 NOTE — Progress Notes (Signed)
Subjective:    Patient ID: Kendra [name], female    DOB: 11/24/1960, 53 y.o.   MRN: [id number]  HPI  [name] is back regarding her chronic pain. She is complaining of right hip and knee pain which seems to be worse. The pain usually starts at the right waist line and radiates down her lateral leg to the foot. Her knee bothers her as well but it seems to be somewhat independent from the radiating leg pain. We have done injections of her knee in the past which have helped for a few months. Her back surgery was about 10 years ago.   Her dizziness has been better since we decreased her metoprolol. She hasn't had any falls!! She is trying to be safer as a whole. She continues to live at home with her mother.    Pain Inventory Average Pain 7 Pain Right Now 8 My pain is stabbing and aching  In the last 24 hours, has pain interfered with the following? General activity 7 Relation with others 6 Enjoyment of life 10 What TIME of day is your pain at its worst? evening, night Sleep (in general) Poor  Pain is worse with: walking, bending, sitting and standing Pain improves with: medication and injections Relief from Meds: 6  Mobility walk with assistance use a cane ability to climb steps?  no do you drive?  no use a wheelchair needs help with transfers Do you have any goals in this area?  yes  Function disabled: date disabled 80 I need assistance with the following:  dressing, bathing, meal prep, household duties and shopping  Neuro/Psych tingling trouble walking spasms dizziness anxiety  Prior Studies Any changes since last visit?  no  Physicians involved in your care Any changes since last visit?  no   Family History  Problem Relation Age of Onset  . Diabetes Mother   . Cancer Father   . Cancer Maternal Grandfather    History   Social History  . Marital Status: Single    Spouse Name: N/A    Number of Children: N/A  . Years of Education: N/A   Social History  Main Topics  . Smoking status: Never Smoker   . Smokeless tobacco: Never Used  . Alcohol Use: No  . Drug Use: No  . Sexual Activity: None   Other Topics Concern  . None   Social History Narrative  . None   Past Surgical History  Procedure Laterality Date  . Cholecystectomy    . Spine surgery    . Brain surgery     Past Medical History  Diagnosis Date  . Kidney stones   . Osteoarthritis   . Muscle pain   . Seizures   . Anxiety    BP 123/77  Pulse 86  Resp 14  Ht 5\' 4"  (1.626 m)  Wt 204 lb 9.6 oz (92.806 kg)  BMI 35.10 kg/m2  SpO2 95%  LMP 09/01/2012      Review of Systems  Respiratory: Positive for cough and shortness of breath.   Cardiovascular: Positive for leg swelling.  Musculoskeletal: Positive for back pain and gait problem.  Neurological: Positive for dizziness.       Tingling, sapsms  Psychiatric/Behavioral: The patient is nervous/anxious.   All other systems reviewed and are negative.       Objective:   Physical Exam  Constitutional: She is oriented to person, place, and time. She appears well-developed and well-nourished.  HENT:  Head: Normocephalic.  Eyes: EOM  are normal. Pupils are equal, round, and reactive to light.  Neck: Normal range of motion.  Cardiovascular: Normal rate and regular rhythm.  Pulmonary/Chest: Effort normal and breath sounds normal.  Abdominal: Soft. She exhibits no distension. There is no tenderness.  Musculoskeletal:  Left shoulder with crepitus and pain with rotational,movements. inpingement manevers were positive. The shoulder isn't truly subluxed but there remains a 3/4 inch gap between the acromium and humeral head. Right knee with crepitus with PROM and AROM. Right greater troch very tender with palpation. Her right hip was mildly tender. There was a small abrasion noted without bruising. She had pain with palpation over her lateral right ankle with mild swelling. Inversion of the ankle did cause pain.   Neurological: She is alert and oriented to person, place, and time.  Continued intermittent dsykinesias of the left upper ext. Minimal resting tone is seen.  Speech slurred at baseline. Cognition is appropriate. She is alert.  Skin: Skin is warm.  Psychiatric: She has a normal mood and affect. Her behavior is normal. Judgment and thought content normal. She is pleasant as always.  Assessment & Plan:   ASSESSMENT:  1. 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 The  patient obviously with associated rotator cuff tendinitis on the  Left as well.  2. Seizure disorder.  3. Low back pain, post-laminectomy syndrome, likely some radicular pain right leg 4. Reactive depression.  5. Right greater trochanteric bursitis  6. Right ankle sprain after fall.  7. Hypotension/ orthostatic hypotension --improved 8. OA of bilateral knees  PLAN:  1. Xrays of lumbar spine to assess operative site and disks/joints  2. Glucosamine/chondroitin or other comparable visco supplement. Consider knee injections 3. Voltaren gel to be use tid to knees/ shoulders should continue 4. 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 Hydrocodone was needed today. --rx provided with another rx for next month  6.We will schedule her a 2 month followup. 30 minutes of face to face patient care time were spent during this visit. All questions were encouraged and answered.  7. Gabapentin 100mg  BID to TID for radicular pain RLE. Will need to observe closely for neuro-sedative side effects.

## 2013-10-23 NOTE — Patient Instructions (Signed)
TAKE GABAPENTIN 100MG  AT NIGHT FOR 4 NIGHTS THEN TWICE DAILY FOR 4 NIGHTS THEN THREE X DAILY THEREAFTER.   TRY GLUCOSAMINE WITH CHRONDROITIN OR SOMETHING SIMILAR (JOINT SUPPLEMENT) FOR YOUR KNEE PAIN.

## 2013-10-24 ENCOUNTER — Telehealth: Payer: Self-pay | Admitting: Physical Medicine & Rehabilitation

## 2013-10-24 NOTE — Telephone Encounter (Signed)
Her L2-3 level is very tight on xray. I know she doesn't want surgery, but it might be useful to get there opinion. Otherwise, we could consider an ESI at this level to help with pain.

## 2013-10-26 NOTE — Telephone Encounter (Signed)
Patient informed of xray results.  She will think about her options and discuss at her next appointment.

## 2013-11-29 ENCOUNTER — Other Ambulatory Visit: Payer: Self-pay | Admitting: Physical Medicine & Rehabilitation

## 2013-12-19 ENCOUNTER — Encounter: Payer: Self-pay | Admitting: Physical Medicine & Rehabilitation

## 2013-12-19 ENCOUNTER — Encounter: Payer: Medicare HMO | Attending: Physical Medicine & Rehabilitation | Admitting: Physical Medicine & Rehabilitation

## 2013-12-19 VITALS — BP 127/74 | HR 76 | Resp 14 | Ht 64.0 in | Wt 204.0 lb

## 2013-12-19 DIAGNOSIS — IMO0002 Reserved for concepts with insufficient information to code with codable children: Secondary | ICD-10-CM | POA: Insufficient documentation

## 2013-12-19 DIAGNOSIS — S93409A Sprain of unspecified ligament of unspecified ankle, initial encounter: Secondary | ICD-10-CM

## 2013-12-19 DIAGNOSIS — S069X9A Unspecified intracranial injury with loss of consciousness of unspecified duration, initial encounter: Secondary | ICD-10-CM

## 2013-12-19 DIAGNOSIS — I951 Orthostatic hypotension: Secondary | ICD-10-CM

## 2013-12-19 DIAGNOSIS — S069XAA Unspecified intracranial injury with loss of consciousness status unknown, initial encounter: Secondary | ICD-10-CM

## 2013-12-19 DIAGNOSIS — G8114 Spastic hemiplegia affecting left nondominant side: Secondary | ICD-10-CM

## 2013-12-19 DIAGNOSIS — G8110 Spastic hemiplegia affecting unspecified side: Secondary | ICD-10-CM | POA: Insufficient documentation

## 2013-12-19 DIAGNOSIS — M961 Postlaminectomy syndrome, not elsewhere classified: Secondary | ICD-10-CM | POA: Insufficient documentation

## 2013-12-19 DIAGNOSIS — M1710 Unilateral primary osteoarthritis, unspecified knee: Secondary | ICD-10-CM

## 2013-12-19 DIAGNOSIS — M179 Osteoarthritis of knee, unspecified: Secondary | ICD-10-CM

## 2013-12-19 MED ORDER — HYDROCODONE-ACETAMINOPHEN 7.5-325 MG PO TABS: ORAL_TABLET | ORAL | Status: DC

## 2013-12-19 NOTE — Patient Instructions (Signed)
PLEASE CALL ME WITH ANY PROBLEMS OR QUESTIONS (#[phone]).      

## 2013-12-19 NOTE — Progress Notes (Signed)
Subjective:    Patient ID: Kendra [name], female    DOB: Jul 11, 1961, 53 y.o.   MRN: [id number]  HPI  Kendra [name] is back regarding her chronic pain and gait issues. She has been "laying low" a lot given the weather. She has continued to fall at home, most recently when she turned her ankle and fell at home.   Her lumbar xray showed: 1. L3 through S1 solid posterior lumbar interbody fusion. No  hardware complication.  2. 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  3. No compression fracture or acute osseous abnormality.  She is still having low back pain which tends to radiate around the right leg laterally into the ankle. The left leg bothers her more at the ankle. She did not tolerate the gabapentin due to AMS.  She is using hydrocodone for breakthrough pain.  Pain Inventory Average Pain 7 Pain Right Now 7 My pain is stabbing and aching  In the last 24 hours, has pain interfered with the following? General activity 1 Relation with others 4 Enjoyment of life 4 What TIME of day is your pain at its worst? constant Sleep (in general) Fair  Pain is worse with: walking, sitting and standing Pain improves with: medication Relief from Meds: 6  Mobility use a cane how many minutes can you walk? 3-4 ability to climb steps?  no do you drive?  no use a wheelchair Do you have any goals in this area?  yes  Function disabled: date disabled 36 I need assistance with the following:  dressing, toileting, meal prep, household duties and shopping  Neuro/Psych weakness trouble walking spasms dizziness anxiety  Prior Studies Any changes since last visit?  no  Physicians involved in your care Any changes since last visit?  no   Family History  Problem Relation Age of Onset  . Diabetes Mother   . Cancer Father   . Cancer Maternal Grandfather    History   Social History  . Marital Status: Single    Spouse Name:  N/A    Number of Children: N/A  . Years of Education: N/A   Social History Main Topics  . Smoking status: Never Smoker   . Smokeless tobacco: Never Used  . Alcohol Use: No  . Drug Use: No  . Sexual Activity: None   Other Topics Concern  . None   Social History Narrative  . None   Past Surgical History  Procedure Laterality Date  . Cholecystectomy    . Spine surgery    . Brain surgery     Past Medical History  Diagnosis Date  . Kidney stones   . Osteoarthritis   . Muscle pain   . Seizures   . Anxiety    BP 127/74  Pulse 76  Resp 14  Ht 5\' 4"  (1.626 m)  Wt 204 lb (92.534 kg)  BMI 35.00 kg/m2  SpO2 93%  LMP 09/01/2012  Opioid Risk Score:   Fall Risk Score: High Fall Risk (>13 points) (patient educated handout given)   Review of Systems  Respiratory: Positive for shortness of breath.   Musculoskeletal: Positive for gait problem.  Neurological: Positive for dizziness and weakness.  Psychiatric/Behavioral: The patient is nervous/anxious.   All other systems reviewed and are negative.       Objective:   Physical Exam  Constitutional: She is oriented to person, place, and time. She appears well-developed and well-nourished.  HENT:  Head: Normocephalic.  Eyes: EOM are normal. Pupils are equal, round, and reactive to light.  Neck: Normal range of motion.  Cardiovascular: Normal rate and regular rhythm.  Pulmonary/Chest: Effort normal and breath sounds normal.  Abdominal: Soft. She exhibits no distension. There is no tenderness.  Musculoskeletal:  Left shoulder with crepitus and pain with rotational,movements. inpingement manevers were positive. The shoulder isn't truly subluxed but there remains a 3/4 inch gap between the acromium and humeral head. Right knee with crepitus with PROM and AROM. Right greater troch very tender with palpation. Her right hip was mildly tender. There was a small abrasion noted without bruising. She had pain with palpation over her  lateral right ankle with mild swelling. Inversion of the ankle did cause pain.  Neurological: She is alert and oriented to person, place, and time.  Continued intermittent dsykinesias of the left upper ext. Minimal resting tone is seen. Strength is at baseline in the LE's. Grossly 4/5. (limited at left ankle, trace to 1). DTR's 1+ in the le's  Speech slurred at baseline. Cognition is appropriate. She is alert.  Skin: Skin is warm.  Psychiatric: She has a normal mood and affect. Her behavior is normal. Judgment and thought content normal. She is pleasant as always.  Assessment & Plan:   ASSESSMENT:  1. 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 The  patient  with associated rotator cuff tendinitis on the  Left as well.  2. Seizure disorder.  3. Low back pain, post-laminectomy syndrome. Severe disc disease at L2-3 just above op site. 4. Reactive depression.  5. Right greater trochanteric bursitis  6. Right ankle sprain after fall.  7. Hypotension/ orthostatic hypotension --improved  8. OA of bilateral knees   PLAN:  1. CT myelogram to assess L2-3 space and peri-operative site. With this we can determine treatment plan. Dr. Annette Stable performed her last two surgeries.   2. Glucosamine/chondroitin or other comparable visco supplement for knees.   3. Voltaren gel to be use tid to knees   4. 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 Hydrocodone was   provided with another rx for next month  6. We will schedule her a 2 month followup. 30 minutes of face to face patient care time were spent during this visit. All questions were encouraged and answered.

## 2013-12-31 ENCOUNTER — Other Ambulatory Visit: Payer: Self-pay

## 2013-12-31 ENCOUNTER — Telehealth: Payer: Self-pay

## 2013-12-31 DIAGNOSIS — IMO0002 Reserved for concepts with insufficient information to code with codable children: Secondary | ICD-10-CM

## 2013-12-31 DIAGNOSIS — M961 Postlaminectomy syndrome, not elsewhere classified: Secondary | ICD-10-CM

## 2013-12-31 DIAGNOSIS — G8110 Spastic hemiplegia affecting unspecified side: Secondary | ICD-10-CM

## 2013-12-31 NOTE — Telephone Encounter (Addendum)
Wells Guiles @ [facility] called to verify that you wanted patient to have CT of lumbar with and wo contrast. She said they normally only do wo contrast. Please advise.

## 2013-12-31 NOTE — Telephone Encounter (Signed)
Contacted Andee Poles and Wells Guiles at [facility]. Placed ordered for CT Myelogram.

## 2013-12-31 NOTE — Telephone Encounter (Signed)
i want a ct myelogram. We were told to order the test that way so that a ct myelogram was done!

## 2014-01-01 ENCOUNTER — Other Ambulatory Visit: Payer: Medicare HMO

## 2014-01-10 ENCOUNTER — Ambulatory Visit
Admission: RE | Admit: 2014-01-10 | Discharge: 2014-01-10 | Disposition: A | Discharge status: Home / Self Care | Payer: Commercial Managed Care - HMO | Source: Ambulatory Visit | Attending: Physical Medicine & Rehabilitation | Admitting: Physical Medicine & Rehabilitation

## 2014-01-10 ENCOUNTER — Telehealth: Payer: Self-pay | Admitting: Physical Medicine & Rehabilitation

## 2014-01-10 ENCOUNTER — Other Ambulatory Visit: Payer: Self-pay | Admitting: Physical Medicine & Rehabilitation

## 2014-01-10 VITALS — BP 128/73 | HR 80

## 2014-01-10 DIAGNOSIS — M961 Postlaminectomy syndrome, not elsewhere classified: Secondary | ICD-10-CM

## 2014-01-10 DIAGNOSIS — IMO0002 Reserved for concepts with insufficient information to code with codable children: Secondary | ICD-10-CM

## 2014-01-10 DIAGNOSIS — G8114 Spastic hemiplegia affecting left nondominant side: Secondary | ICD-10-CM

## 2014-01-10 DIAGNOSIS — G8110 Spastic hemiplegia affecting unspecified side: Secondary | ICD-10-CM

## 2014-01-10 MED ORDER — DIAZEPAM 5 MG PO TABS
5.0000 mg | ORAL_TABLET | Freq: Once | ORAL | Status: AC
  Administered 2014-01-10: 5 mg via ORAL

## 2014-01-10 MED ORDER — IOHEXOL 180 MG/ML  SOLN
15.0000 mL | Freq: Once | INTRAMUSCULAR | Status: AC | PRN
  Administered 2014-01-10: 15 mL via INTRATHECAL

## 2014-01-10 NOTE — Discharge Instructions (Signed)

## 2014-01-11 NOTE — Telephone Encounter (Signed)
I reviewed CT/myelogram with pt. Have made referral to Dr. Annette Stable

## 2014-01-11 NOTE — Telephone Encounter (Signed)
Tried to contact patient regarding CT/myelogram results, no answer unable to leave message.

## 2014-01-12 ENCOUNTER — Other Ambulatory Visit: Payer: Self-pay | Admitting: Physical Medicine & Rehabilitation

## 2014-01-14 ENCOUNTER — Telehealth: Payer: Self-pay | Admitting: Physical Medicine & Rehabilitation

## 2014-01-14 NOTE — Telephone Encounter (Signed)
FYI, I DID DISCUSS CT RESULTS WITH PATIENT AND MADE A REFERRAL TO DR. POOL

## 2014-01-27 ENCOUNTER — Other Ambulatory Visit: Payer: Self-pay | Admitting: Physical Medicine & Rehabilitation

## 2014-01-28 MED ORDER — MELOXICAM 15 MG PO TABS: ORAL_TABLET | ORAL | Status: DC

## 2014-01-30 ENCOUNTER — Other Ambulatory Visit: Payer: Self-pay | Admitting: Physical Medicine & Rehabilitation

## 2014-02-06 ENCOUNTER — Telehealth: Payer: Self-pay

## 2014-02-06 MED ORDER — METOPROLOL SUCCINATE ER 50 MG PO TB24: 50.0000 mg | ORAL_TABLET | Freq: Every day | ORAL | Status: DC

## 2014-02-06 NOTE — Telephone Encounter (Signed)
Metoprolol 50 mg e scribed to pharmacy per Dr. Naaman Plummer. Contacted patient to inform her the refill has been sent to pharmacy.

## 2014-02-06 NOTE — Telephone Encounter (Signed)
Patient mother called requesting metoprolol 50mg  refill.

## 2014-02-06 NOTE — Telephone Encounter (Signed)
Ok, that's fine.

## 2014-02-18 ENCOUNTER — Encounter: Payer: Commercial Managed Care - HMO | Admitting: Physical Medicine & Rehabilitation

## 2014-02-18 ENCOUNTER — Telehealth: Payer: Self-pay

## 2014-02-18 MED ORDER — HYDROCODONE-ACETAMINOPHEN 7.5-325 MG PO TABS: ORAL_TABLET | ORAL | Status: DC

## 2014-02-18 NOTE — Telephone Encounter (Signed)
Hydrocodone rx printed.  Will contact patient when signed.

## 2014-02-18 NOTE — Telephone Encounter (Signed)
Patients mother had to reschedule appointment.  She would like to get a refill on her hydrocodone.  There are no appointments available until next month.

## 2014-02-18 NOTE — Telephone Encounter (Signed)
She may pick up a refill

## 2014-02-19 NOTE — Telephone Encounter (Signed)
rx ready for pick up.Notified Anye.

## 2014-03-12 ENCOUNTER — Telehealth: Payer: Self-pay

## 2014-03-12 NOTE — Telephone Encounter (Signed)
Patient called requesting hydrocodone refill.

## 2014-03-13 NOTE — Telephone Encounter (Signed)
Last fill date 02/18/14. To early for refill yet. Notified patient and she will call a couple days before time to refill Hydrocodone.

## 2014-04-02 ENCOUNTER — Other Ambulatory Visit: Payer: Self-pay | Admitting: Physical Medicine & Rehabilitation

## 2014-04-04 ENCOUNTER — Other Ambulatory Visit: Payer: Self-pay | Admitting: Neurosurgery

## 2014-04-05 ENCOUNTER — Encounter: Payer: Self-pay | Admitting: Physical Medicine & Rehabilitation

## 2014-04-05 ENCOUNTER — Encounter: Payer: Medicare HMO | Attending: Physical Medicine & Rehabilitation | Admitting: Physical Medicine & Rehabilitation

## 2014-04-05 VITALS — BP 107/58 | HR 82 | Resp 14 | Ht 64.0 in | Wt 207.0 lb

## 2014-04-05 DIAGNOSIS — G8110 Spastic hemiplegia affecting unspecified side: Secondary | ICD-10-CM | POA: Insufficient documentation

## 2014-04-05 DIAGNOSIS — M961 Postlaminectomy syndrome, not elsewhere classified: Secondary | ICD-10-CM | POA: Insufficient documentation

## 2014-04-05 DIAGNOSIS — IMO0002 Reserved for concepts with insufficient information to code with codable children: Secondary | ICD-10-CM

## 2014-04-05 DIAGNOSIS — G8114 Spastic hemiplegia affecting left nondominant side: Secondary | ICD-10-CM

## 2014-04-05 DIAGNOSIS — M19019 Primary osteoarthritis, unspecified shoulder: Secondary | ICD-10-CM

## 2014-04-05 MED ORDER — HYDROCODONE-ACETAMINOPHEN 7.5-325 MG PO TABS: ORAL_TABLET | ORAL | Status: DC

## 2014-04-05 NOTE — Patient Instructions (Signed)
PLEASE CALL ME WITH ANY PROBLEMS OR QUESTIONS (#[phone]).      

## 2014-04-05 NOTE — Progress Notes (Signed)
Subjective:    Patient ID: Kendra [name], female    DOB: [birth year], 53 y.o.   MRN: [id number]  HPI  Kendra [name] is back regarding her chronic pain/BI. She saw Dr. Annette Stable who is scheduling her for fusion surgery at L2-3 in August. Her Ct myelogram revealed:   L2-L3: Severe disc degeneration with mild to moderate central  stenosis. This is multifactorial, associated with posterior  ligamentum flavum redundancy, mild facet arthrosis and predominantly  due to broad-based disc bulging. The disc bulging is right eccentric  and produces moderate to severe bilateral foraminal stenosis,  greater on the right than left. There is narrowing of both lateral  recesses. The central stenosis is better delineated on the prone  images.  L3-L4: Solid fusion. Wide posterior decompression with laminectomy.  No complication or recurrent stenosis.  L4-L5: Solid fusion. Wide posterior decompression with laminectomy.  No complication or recurrent stenosis.  L5-S1: Solid fusion. Wide posterior decompression with laminectomy.  No complication or recurrent stenosis.  Dr. Annette Stable had mentioned botox perhaps to her left arm which we had performed in the past.   She maintains on her established pain regimen including hydrocodone   Pain Inventory Average Pain 8 Pain Right Now 9 My pain is sharp and stabbing  In the last 24 hours, has pain interfered with the following? General activity 8 Relation with others 7 Enjoyment of life 10 What TIME of day is your pain at its worst? varies Sleep (in general) Fair  Pain is worse with: walking, bending and standing Pain improves with: medication Relief from Meds: 6  Mobility walk with assistance use a cane ability to climb steps?  no do you drive?  no  Function disabled: date disabled . I need assistance with the following:  meal prep, household duties and shopping  Neuro/Psych weakness numbness trouble walking spasms dizziness anxiety  Prior  Studies Any changes since last visit?  no  Physicians involved in your care Any changes since last visit?  no   Family History  Problem Relation Age of Onset  . Diabetes Mother   . Cancer Father   . Cancer Maternal Grandfather    History   Social History  . Marital Status: Single    Spouse Name: N/A    Number of Children: N/A  . Years of Education: N/A   Social History Main Topics  . Smoking status: Never Smoker   . Smokeless tobacco: Never Used  . Alcohol Use: No  . Drug Use: No  . Sexual Activity: None   Other Topics Concern  . None   Social History Narrative  . None   Past Surgical History  Procedure Laterality Date  . Spine surgery  2003, 2004  . Brain surgery  1977  . Cholecystectomy  [id number]   Past Medical History  Diagnosis Date  . Kidney stones   . Osteoarthritis   . Muscle pain   . Seizures   . Anxiety    BP 107/58  Pulse 82  Resp 14  Ht 5\' 4"  (1.626 m)  Wt 207 lb (93.895 kg)  BMI 35.51 kg/m2  SpO2 94%  LMP 09/01/2012  Opioid Risk Score:   Fall Risk Score: High Fall Risk (>13 points) (previously educated and hgiven handout on fall prevention in the home) Review of Systems  Respiratory: Positive for shortness of breath.   Musculoskeletal: Positive for gait problem.       Spasms  Neurological: Positive for dizziness, weakness and numbness.  Psychiatric/Behavioral: The patient  is nervous/anxious.   All other systems reviewed and are negative.      Objective:   Physical Exam  Constitutional: She is oriented to person, place, and time. She appears well-developed and well-nourished.  HENT:  Head: Normocephalic.  Eyes: EOM are normal. Pupils are equal, round, and reactive to light.  Neck: Normal range of motion.  Cardiovascular: Normal rate and regular rhythm.  Pulmonary/Chest: Effort normal and breath sounds normal.  Abdominal: Soft. She exhibits no distension. There is no tenderness.  Musculoskeletal:  Low back tender with  flexion and extension. Fair sitting posture. .  Neurological: She is alert and oriented to person, place, and time.  Continued intermittent dsykinesias of the left upper ext. Minimal resting tone is seen. If anything she has fluctuating tone in the left triceps causing shoulder and elbow extension. Strength is at baseline in the LE's. Grossly 4/5. (limited at left ankle, trace to 1). DTR's 1+ in the le's  Speech slurred at baseline. Cognition is appropriate. She is alert.  Skin: Skin is warm.  Psychiatric: She has a normal mood and affect. Her behavior is normal. Judgment and thought content normal. She is pleasant as always.   Assessment & Plan:   ASSESSMENT:  1. 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 The  patient with associated rotator cuff tendinitis on the  Left as well.  2. Seizure disorder.  3. Low back pain, post-laminectomy syndrome. Severe disc disease at L2-3 just above op site.  4. Reactive depression.  5. Right greater trochanteric bursitis  6. Right ankle sprain after fall.  7. Hypotension/ orthostatic hypotension --improved  8. OA of bilateral knees   PLAN:  1.Lumbar fusion per Dr. Annette Stable in August? 2. Glucosamine/chondroitin or other comparable visco supplement for knees.  3. Voltaren gel to be use tid to knees  4. I am willing to attempt botox injections to her left triceps, perhaps to trap, 200u  5. Hydrocodone was provided with another rx for next month  6. We will schedule her for botox in a few weeks.  30 minutes of face to face patient care time were spent during this visit. All questions were encouraged and answered.

## 2014-04-19 ENCOUNTER — Other Ambulatory Visit: Payer: Self-pay | Admitting: Physical Medicine & Rehabilitation

## 2014-05-03 ENCOUNTER — Encounter (HOSPITAL_BASED_OUTPATIENT_CLINIC_OR_DEPARTMENT_OTHER): Payer: Medicare HMO | Admitting: Registered Nurse

## 2014-05-03 ENCOUNTER — Encounter: Payer: Self-pay | Admitting: Registered Nurse

## 2014-05-03 ENCOUNTER — Encounter: Payer: Medicare HMO | Attending: Physical Medicine & Rehabilitation | Admitting: Physical Medicine & Rehabilitation

## 2014-05-03 ENCOUNTER — Encounter: Payer: Self-pay | Admitting: Physical Medicine & Rehabilitation

## 2014-05-03 VITALS — BP 111/70 | HR 95 | Resp 14 | Wt 202.0 lb

## 2014-05-03 DIAGNOSIS — M961 Postlaminectomy syndrome, not elsewhere classified: Secondary | ICD-10-CM | POA: Insufficient documentation

## 2014-05-03 DIAGNOSIS — G8110 Spastic hemiplegia affecting unspecified side: Secondary | ICD-10-CM | POA: Insufficient documentation

## 2014-05-03 DIAGNOSIS — S069X5A Unspecified intracranial injury with loss of consciousness greater than 24 hours with return to pre-existing conscious level, initial encounter: Secondary | ICD-10-CM

## 2014-05-03 DIAGNOSIS — G8114 Spastic hemiplegia affecting left nondominant side: Secondary | ICD-10-CM

## 2014-05-03 DIAGNOSIS — IMO0002 Reserved for concepts with insufficient information to code with codable children: Secondary | ICD-10-CM

## 2014-05-03 MED ORDER — HYDROCODONE-ACETAMINOPHEN 7.5-325 MG PO TABS: ORAL_TABLET | ORAL | Status: DC

## 2014-05-03 NOTE — Progress Notes (Signed)
Botox Injection for spasticity using needle EMG guidance Indication: spastic hemiparesis non-dominant limb, LUE  Dilution: 100 Units/ml        Total Units Injected: 200 Indication: Severe spasticity which interferes with ADL,mobility and/or  hygiene and is unresponsive to medication management and other conservative care Informed consent was obtained after describing risks and benefits of the procedure with the patient. This includes bleeding, bruising, infection, excessive weakness, or medication side effects. A REMS form is on file and signed.  Locations: Tricep 125units, 4 separate access sites Trap, 75units, 3 separate access sites  Aspiration was performed prior to each injection. Pt tolerated well.  I refilled hydrocodone with a second rx for next month.   Follow up appt was made for 2 months.

## 2014-05-03 NOTE — Patient Instructions (Signed)
PLEASE CALL ME WITH ANY PROBLEMS OR QUESTIONS (#[phone]).      

## 2014-05-06 ENCOUNTER — Encounter (HOSPITAL_COMMUNITY): Payer: Self-pay | Admitting: Pharmacy Technician

## 2014-05-06 NOTE — Progress Notes (Signed)
Dr. Naaman Plummer seen this paitient, she came for Botox injection.

## 2014-05-07 ENCOUNTER — Encounter (HOSPITAL_COMMUNITY)
Admission: RE | Admit: 2014-05-07 | Discharge: 2014-05-07 | Disposition: A | Discharge status: Home / Self Care | Payer: Medicare HMO | Source: Ambulatory Visit | Attending: Neurosurgery | Admitting: Neurosurgery

## 2014-05-07 ENCOUNTER — Encounter (HOSPITAL_COMMUNITY)
Admission: RE | Admit: 2014-05-07 | Discharge: 2014-05-07 | Disposition: A | Discharge status: Home / Self Care | Payer: Medicare HMO | Source: Ambulatory Visit | Attending: Anesthesiology | Admitting: Anesthesiology

## 2014-05-07 ENCOUNTER — Encounter (HOSPITAL_COMMUNITY): Payer: Self-pay

## 2014-05-07 DIAGNOSIS — Z01812 Encounter for preprocedural laboratory examination: Secondary | ICD-10-CM | POA: Insufficient documentation

## 2014-05-07 DIAGNOSIS — Z01818 Encounter for other preprocedural examination: Secondary | ICD-10-CM | POA: Diagnosis not present

## 2014-05-07 DIAGNOSIS — Z01810 Encounter for preprocedural cardiovascular examination: Secondary | ICD-10-CM | POA: Diagnosis not present

## 2014-05-07 HISTORY — DX: Personal history of urinary calculi: Z87.442

## 2014-05-07 HISTORY — DX: Essential (primary) hypertension: I10

## 2014-05-07 HISTORY — DX: Gastro-esophageal reflux disease without esophagitis: K21.9

## 2014-05-07 LAB — BASIC METABOLIC PANEL
Anion gap: 16 — ABNORMAL HIGH (ref 5–15)
BUN: 11 mg/dL (ref 6–23)
CALCIUM: 9.2 mg/dL (ref 8.4–10.5)
CO2: 26 meq/L (ref 19–32)
CREATININE: 0.62 mg/dL (ref 0.50–1.10)
Chloride: 98 mEq/L (ref 96–112)
GFR calc Af Amer: >90 mL/min (ref >90)
GFR calc non Af Amer: >90 mL/min (ref >90)
Glucose, Bld: 172 mg/dL — ABNORMAL HIGH (ref 70–99)
Potassium: 3.9 mEq/L (ref 3.7–5.3)
SODIUM: 140 meq/L (ref 137–147)

## 2014-05-07 LAB — CBC WITH DIFFERENTIAL/PLATELET
BASOS ABS: 0 10*3/uL (ref 0.0–0.1)
BASOS PCT: 1 % (ref 0–1)
EOS ABS: 0.1 10*3/uL (ref 0.0–0.7)
Eosinophils Relative: 2 % (ref 0–5)
HCT: 42.8 % (ref 36.0–46.0)
Hemoglobin: 14 g/dL (ref 12.0–15.0)
LYMPHS PCT: 42 % (ref 12–46)
Lymphs Abs: 3.6 10*3/uL (ref 0.7–4.0)
MCH: 30.6 pg (ref 26.0–34.0)
MCHC: 32.7 g/dL (ref 30.0–36.0)
MCV: 93.7 fL (ref 78.0–100.0)
Monocytes Absolute: 0.5 10*3/uL (ref 0.1–1.0)
Monocytes Relative: 6 % (ref 3–12)
Neutro Abs: 4.4 10*3/uL (ref 1.7–7.7)
Neutrophils Relative %: 51 % (ref 43–77)
PLATELETS: 305 10*3/uL (ref 150–400)
RBC: 4.57 MIL/uL (ref 3.87–5.11)
RDW: 12.9 % (ref 11.5–15.5)
WBC: 8.6 10*3/uL (ref 4.0–10.5)

## 2014-05-07 LAB — TYPE AND SCREEN
ABO/RH(D): A POS
Antibody Screen: NEGATIVE

## 2014-05-07 LAB — ABO/RH: ABO/RH(D): A POS

## 2014-05-07 LAB — SURGICAL PCR SCREEN
MRSA, PCR: NEGATIVE
STAPHYLOCOCCUS AUREUS: NEGATIVE

## 2014-05-07 NOTE — Progress Notes (Signed)
Scheduled to have cat scan 05/07/14 to check out hematuria.

## 2014-05-07 NOTE — Pre-Procedure Instructions (Addendum)
Kendra [name]  05/07/2014   Your procedure is scheduled on:  05/20/14  Report to [facility] short stay admitting at 545 AM.  Call this number if you have problems the morning of surgery: [phone]   Remember:   Do not eat food or drink liquids after midnight.   Take these medicines the morning of surgery with A SIP OF WATER: clonazepam, nexium, pain med, if needed, keppra, metoprolol     Take all meds until day of surgery except as instructed below or per dr    Kendra [name] all herbel meds, nsaids (aleve,naproxen,advil,ibuprofen) 5 days prior to surgery(05/15/14) including vitamins, aspirin, meloxicam    Do not wear jewelry, make-up or nail polish.  Do not wear lotions, powders, or perfumes. You may wear deodorant.  Do not shave 48 hours prior to surgery. Men may shave face and neck.  Do not bring valuables to the hospital.  [facility] is not responsible                  for any belongings or valuables.               Contacts, dentures or bridgework may not be worn into surgery.  Leave suitcase in the car. After surgery it may be brought to your room.  For patients admitted to the hospital, discharge time is determined by your                treatment team.               Patients discharged the day of surgery will not be allowed to drive  home.  Name and phone number of your driver:   Special Instructions:  Special Instructions: [facility] - Preparing for Surgery  Before surgery, you can play an important role.  Because skin is not sterile, your skin needs to be as free of germs as possible.  You can reduce the number of germs on you skin by washing with CHG (chlorahexidine gluconate) soap before surgery.  CHG is an antiseptic cleaner which kills germs and bonds with the skin to continue killing germs even after washing.  Please DO NOT use if you have an allergy to CHG or antibacterial soaps.  If your skin becomes reddened/irritated stop using the CHG and inform your nurse when you arrive  at Short Stay.  Do not shave (including legs and underarms) for at least 48 hours prior to the first CHG shower.  You may shave your face.  Please follow these instructions carefully:   1.  Shower with CHG Soap the night before surgery and the morning of Surgery.  2.  If you choose to wash your hair, wash your hair first as usual with your normal shampoo.  3.  After you shampoo, rinse your hair and body thoroughly to remove the Shampoo.  4.  Use CHG as you would any other liquid soap.  You can apply chg directly  to the skin and wash gently with scrungie or a clean washcloth.  5.  Apply the CHG Soap to your body ONLY FROM THE NECK DOWN.  Do not use on open wounds or open sores.  Avoid contact with your eyes ears, mouth and genitals (private parts).  Wash genitals (private parts)       with your normal soap.  6.  Wash thoroughly, paying special attention to the area where your surgery will be performed.  7.  Thoroughly rinse your body with warm water from  the neck down.  8.  DO NOT shower/wash with your normal soap after using and rinsing off the CHG Soap.  9.  Pat yourself dry with a clean towel.            10.  Wear clean pajamas.            11.  Place clean sheets on your bed the night of your first shower and do not sleep with pets.  Day of Surgery  Do not apply any lotions/deodorants the morning of surgery.  Please wear clean clothes to the hospital/surgery center.   Please read over the following fact sheets that you were given: Pain Booklet, Coughing and Deep Breathing, Blood Transfusion Information, MRSA Information and Surgical Site Infection Prevention

## 2014-05-07 NOTE — Progress Notes (Signed)
req'd ekg,office notes, any heart hx or tests from dr Delfina [facility] Alert

## 2014-05-08 ENCOUNTER — Encounter (HOSPITAL_COMMUNITY): Payer: Self-pay | Admitting: Vascular Surgery

## 2014-05-08 NOTE — Progress Notes (Addendum)
Anesthesia Chart Review:  Patient is a 53 year old female scheduled for L2-3 PLIF on 05/20/14 by Dr. Annette Stable.  History includes non-smoker, nephrolithiasis, hematuria (ongoing evaluation), HTN, GERD, anxiety, seizures (on Keppra), brain surgery for tremors '77, cholecystectomy, spinal surgery '03 and '04, lipoma excision. She receives Botox injections for spastic hemiparesis, LUE by Dr. Alger Simons. Notes in Epic indicate a history of a traumatic brain injury in 1972 with persistent dyskinesias on the left. BMI is consistent with obesity.  PCP is Dr. Cher Nakai with [facility], records pending.    EKG on 05/07/14 showed: NSR, LAD, anterolateral infarct (age undetermined).  No significant change since her last tracings in Muse from 08/06/03 and 06/29/02.   Preoperative labs noted.    [name]. However, her current EKG appears stable for over 10 years with two neurosurgical surgeries since her 2003 EKG.  No documented CAD, MI, CHF, DM, or chest pain history from [name].  Based on currently available information, I would anticipate that she could proceed as planned in If no acute changes or new CV symptomology when evaluated by her anesthesiologist on the day of surgery. (Update: 08/05/13 PCP notes reviewed.  No record of prior EKG or cardiac studies at [facility].)  [facility] Short Stay Center/Anesthesiology Phone [phone] 05/08/2014 5:18 PM

## 2014-05-13 NOTE — Progress Notes (Signed)
Spoke with Dr, Marguerita Beards office who stated they do not have EKG or any cardiac studies.  Chart taken to [facility] to review office note.

## 2014-05-17 ENCOUNTER — Telehealth: Payer: Self-pay

## 2014-05-17 DIAGNOSIS — M961 Postlaminectomy syndrome, not elsewhere classified: Secondary | ICD-10-CM | POA: Diagnosis not present

## 2014-05-17 NOTE — Telephone Encounter (Signed)
FYI::  Patient's mother called on her behalf to inform Dr. Naaman Plummer that patient's back surgery has been postponed at least one month. Patient has a "infection" in her bladder and uterus.

## 2014-05-17 NOTE — Telephone Encounter (Signed)
noted 

## 2014-05-20 ENCOUNTER — Encounter (HOSPITAL_COMMUNITY): Admission: RE | Payer: Commercial Managed Care - HMO | Source: Ambulatory Visit

## 2014-05-20 ENCOUNTER — Inpatient Hospital Stay (HOSPITAL_COMMUNITY): Admission: RE | Admit: 2014-05-20 | Payer: Medicare HMO | Source: Ambulatory Visit | Admitting: Neurosurgery

## 2014-05-20 SURGERY — POSTERIOR LUMBAR FUSION 1 LEVEL
Anesthesia: General | Site: Back

## 2014-05-21 ENCOUNTER — Telehealth: Payer: Self-pay

## 2014-05-21 NOTE — Telephone Encounter (Signed)
Debra @ [facility] is requesting patient's OV notes faxed to [phone]. No further details were given.

## 2014-05-22 ENCOUNTER — Other Ambulatory Visit: Payer: Self-pay | Admitting: Physical Medicine & Rehabilitation

## 2014-05-22 ENCOUNTER — Encounter: Payer: Self-pay | Admitting: Physical Medicine & Rehabilitation

## 2014-05-22 NOTE — Telephone Encounter (Signed)
Notes faxed.

## 2014-07-03 ENCOUNTER — Encounter: Payer: Medicare HMO | Attending: Physical Medicine & Rehabilitation | Admitting: Registered Nurse

## 2014-07-03 ENCOUNTER — Encounter: Payer: Self-pay | Admitting: Registered Nurse

## 2014-07-03 VITALS — BP 125/82 | HR 80 | Resp 14

## 2014-07-03 DIAGNOSIS — M961 Postlaminectomy syndrome, not elsewhere classified: Secondary | ICD-10-CM | POA: Insufficient documentation

## 2014-07-03 DIAGNOSIS — M1710 Unilateral primary osteoarthritis, unspecified knee: Secondary | ICD-10-CM

## 2014-07-03 DIAGNOSIS — M76899 Other specified enthesopathies of unspecified lower limb, excluding foot: Secondary | ICD-10-CM

## 2014-07-03 DIAGNOSIS — Z79899 Other long term (current) drug therapy: Secondary | ICD-10-CM

## 2014-07-03 DIAGNOSIS — Z5181 Encounter for therapeutic drug level monitoring: Secondary | ICD-10-CM

## 2014-07-03 DIAGNOSIS — S069X5A Unspecified intracranial injury with loss of consciousness greater than 24 hours with return to pre-existing conscious level, initial encounter: Secondary | ICD-10-CM

## 2014-07-03 DIAGNOSIS — M170 Bilateral primary osteoarthritis of knee: Secondary | ICD-10-CM

## 2014-07-03 DIAGNOSIS — G8110 Spastic hemiplegia affecting unspecified side: Secondary | ICD-10-CM | POA: Diagnosis present

## 2014-07-03 DIAGNOSIS — G8114 Spastic hemiplegia affecting left nondominant side: Secondary | ICD-10-CM

## 2014-07-03 DIAGNOSIS — M7061 Trochanteric bursitis, right hip: Secondary | ICD-10-CM

## 2014-07-03 MED ORDER — HYDROCODONE-ACETAMINOPHEN 7.5-325 MG PO TABS: 1.0000 | ORAL_TABLET | Freq: Three times a day (TID) | ORAL | Status: DC | PRN

## 2014-07-03 NOTE — Progress Notes (Signed)
Subjective:    Patient ID: Kendra [name], female    DOB: 27-Feb-1961, 53 y.o.   MRN: [id number]  HPI: Kendra [name] is a 53 year old female who returns for follow up for chronic pain and medication refill. She says her pain is located in her lower back which radiates to her lower extremities. She rates her pain 8. Her current exercise regime is walking short distances in her home and performing stretching exercises. She says she usually stays in  her wheelchair most of the time. Her mother was in room all questions were answered and emotional support given.  She had a Botox injection on 05/03/2014, with good results noted.  Kendra [name] says she diagnosed with Bladder Cancer by Dr. Nila Nephew, she had tumor removed she is awaiting on a treatment modality. Also says the medication is on ordered by Dr. Nila Nephew office and will call her.  She was scheduled to have back surgery by Dr. Trenton Gammon this has been placed on hold until she recives her treatments for bladder cancer.    Pain Inventory Average Pain 9 Pain Right Now 8 My pain is stabbing and aching  In the last 24 hours, has pain interfered with the following? General activity 10 Relation with others 9 Enjoyment of life 10 What TIME of day is your pain at its worst? all Sleep (in general) Fair  Pain is worse with: walking, bending and standing Pain improves with: rest, medication and injections Relief from Meds: 5  Mobility walk with assistance use a cane ability to climb steps?  no do you drive?  no use a wheelchair needs help with transfers  Function disabled: date disabled . I need assistance with the following:  dressing, bathing, meal prep, household duties and shopping  Neuro/Psych weakness numbness trouble walking spasms dizziness anxiety  Prior Studies Any changes since last visit?  yes  Bladder cancer dx  Physicians involved in your care Dr Delanna Ahmadi oncologist   Family History  Problem Relation Age of  Onset  . Diabetes Mother   . Cancer Father   . Cancer Maternal Grandfather    History   Social History  . Marital Status: Single    Spouse Name: N/A    Number of Children: N/A  . Years of Education: N/A   Social History Main Topics  . Smoking status: Never Smoker   . Smokeless tobacco: Never Used  . Alcohol Use: No  . Drug Use: No  . Sexual Activity: None   Other Topics Concern  . None   Social History Narrative  . None   Past Surgical History  Procedure Laterality Date  . Spine surgery  2003, 2004  . Cholecystectomy  mid-1990's  . Brain surgery  1977    for tremors  . Lipoma excision Right     hip   Past Medical History  Diagnosis Date  . Osteoarthritis   . Muscle pain   . Anxiety   . History of kidney stones   . Hypertension   . Hematuria 7/15  . GERD (gastroesophageal reflux disease)   . Seizures     none in yrs- except for  "staring type " freq   BP 125/82  Pulse 80  Resp 14  SpO2 95%  Opioid Risk Score:   Fall Risk Score: High Fall Risk (>13 points) (previously educated and given handout) Review of Systems  Cardiovascular: Positive for leg swelling.  Musculoskeletal: Positive for gait problem.  Spasms  Neurological: Positive for weakness and numbness.  Psychiatric/Behavioral: The patient is nervous/anxious.   All other systems reviewed and are negative.      Objective:   Physical Exam  Nursing note and vitals reviewed. Constitutional: She is oriented to person, place, and time. She appears well-developed and well-nourished.  HENT:  Head: Normocephalic and atraumatic.  Neck: Normal range of motion. Neck supple.  Cardiovascular: Normal rate and regular rhythm.   Pulmonary/Chest: Effort normal and breath sounds normal.  Musculoskeletal:  Normal Muscle Bulk and Muscle Testing Reveals: Upper Extremities: Full ROM on the Right and Muscle Strength 4/5 Left with Decreased ROM 90 Degrees and Muscle Strength 3/5 Thoracic and Lumbar  Hypersensitivity Right Greater Trochanteric Tenderness Lower Extremities: Right Full ROM and Muscle Strength 5/5 Left Decreased ROM leg Brace intact Lower Extremities Flexion Produces Pain into Bilateral Knees   Neurological: She is alert and oriented to person, place, and time.  Skin: Skin is warm and dry.  Psychiatric: She has a normal mood and affect.          Assessment & Plan:  1. 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 Seizure disorder: Continue Keppra 3. Low back pain, post-laminectomy syndrome. Severe disc disease at L2-3 just above op site: Lumbar Fusion Pending:Dr. Trenton Gammon Following Refilled: Hydrocodone 7.5/325mg  one tablet three times a day #90. Second script given. 4. Reactive depression: Continue to Monitor 5. Right greater trochanteric bursitis: Continue Voltaren Gel and Heat Therapy. 6. OA of bilateral knees: Continue Voltaren gel and Heat Therapy.   30 minutes of face to face patient care time was spent during this visit. All questions were encouraged and answered.   F/U in 2 months

## 2014-07-04 ENCOUNTER — Ambulatory Visit: Payer: Commercial Managed Care - HMO | Admitting: Registered Nurse

## 2014-09-02 ENCOUNTER — Encounter: Payer: Self-pay | Admitting: Physical Medicine & Rehabilitation

## 2014-09-02 ENCOUNTER — Encounter
Payer: Commercial Managed Care - HMO | Attending: Physical Medicine & Rehabilitation | Admitting: Physical Medicine & Rehabilitation

## 2014-09-02 ENCOUNTER — Other Ambulatory Visit: Payer: Self-pay | Admitting: Physical Medicine & Rehabilitation

## 2014-09-02 VITALS — BP 120/71 | HR 80 | Resp 14

## 2014-09-02 DIAGNOSIS — M961 Postlaminectomy syndrome, not elsewhere classified: Secondary | ICD-10-CM | POA: Insufficient documentation

## 2014-09-02 DIAGNOSIS — M170 Bilateral primary osteoarthritis of knee: Secondary | ICD-10-CM | POA: Diagnosis not present

## 2014-09-02 DIAGNOSIS — R319 Hematuria, unspecified: Secondary | ICD-10-CM | POA: Diagnosis not present

## 2014-09-02 DIAGNOSIS — M545 Low back pain: Secondary | ICD-10-CM | POA: Diagnosis not present

## 2014-09-02 DIAGNOSIS — M13871 Other specified arthritis, right ankle and foot: Secondary | ICD-10-CM | POA: Insufficient documentation

## 2014-09-02 DIAGNOSIS — M13812 Other specified arthritis, left shoulder: Secondary | ICD-10-CM | POA: Diagnosis not present

## 2014-09-02 DIAGNOSIS — S069X0S Unspecified intracranial injury without loss of consciousness, sequela: Secondary | ICD-10-CM | POA: Insufficient documentation

## 2014-09-02 DIAGNOSIS — K219 Gastro-esophageal reflux disease without esophagitis: Secondary | ICD-10-CM | POA: Insufficient documentation

## 2014-09-02 DIAGNOSIS — M13872 Other specified arthritis, left ankle and foot: Secondary | ICD-10-CM | POA: Insufficient documentation

## 2014-09-02 DIAGNOSIS — I951 Orthostatic hypotension: Secondary | ICD-10-CM | POA: Diagnosis not present

## 2014-09-02 DIAGNOSIS — Z87442 Personal history of urinary calculi: Secondary | ICD-10-CM | POA: Diagnosis not present

## 2014-09-02 DIAGNOSIS — I10 Essential (primary) hypertension: Secondary | ICD-10-CM | POA: Insufficient documentation

## 2014-09-02 DIAGNOSIS — M13862 Other specified arthritis, left knee: Secondary | ICD-10-CM | POA: Insufficient documentation

## 2014-09-02 DIAGNOSIS — F329 Major depressive disorder, single episode, unspecified: Secondary | ICD-10-CM | POA: Insufficient documentation

## 2014-09-02 DIAGNOSIS — Z760 Encounter for issue of repeat prescription: Secondary | ICD-10-CM | POA: Insufficient documentation

## 2014-09-02 DIAGNOSIS — C679 Malignant neoplasm of bladder, unspecified: Secondary | ICD-10-CM | POA: Insufficient documentation

## 2014-09-02 DIAGNOSIS — R569 Unspecified convulsions: Secondary | ICD-10-CM | POA: Insufficient documentation

## 2014-09-02 DIAGNOSIS — M791 Myalgia: Secondary | ICD-10-CM | POA: Diagnosis present

## 2014-09-02 DIAGNOSIS — F419 Anxiety disorder, unspecified: Secondary | ICD-10-CM | POA: Diagnosis not present

## 2014-09-02 DIAGNOSIS — M7061 Trochanteric bursitis, right hip: Secondary | ICD-10-CM | POA: Insufficient documentation

## 2014-09-02 DIAGNOSIS — G8110 Spastic hemiplegia affecting unspecified side: Secondary | ICD-10-CM

## 2014-09-02 DIAGNOSIS — M13861 Other specified arthritis, right knee: Secondary | ICD-10-CM | POA: Insufficient documentation

## 2014-09-02 DIAGNOSIS — M75102 Unspecified rotator cuff tear or rupture of left shoulder, not specified as traumatic: Secondary | ICD-10-CM | POA: Diagnosis not present

## 2014-09-02 DIAGNOSIS — X58XXXS Exposure to other specified factors, sequela: Secondary | ICD-10-CM | POA: Insufficient documentation

## 2014-09-02 DIAGNOSIS — Z5181 Encounter for therapeutic drug level monitoring: Secondary | ICD-10-CM

## 2014-09-02 DIAGNOSIS — G249 Dystonia, unspecified: Secondary | ICD-10-CM | POA: Insufficient documentation

## 2014-09-02 DIAGNOSIS — M25512 Pain in left shoulder: Secondary | ICD-10-CM | POA: Diagnosis not present

## 2014-09-02 DIAGNOSIS — M1990 Unspecified osteoarthritis, unspecified site: Secondary | ICD-10-CM | POA: Diagnosis present

## 2014-09-02 DIAGNOSIS — S069X0D Unspecified intracranial injury without loss of consciousness, subsequent encounter: Secondary | ICD-10-CM

## 2014-09-02 DIAGNOSIS — M79602 Pain in left arm: Secondary | ICD-10-CM | POA: Insufficient documentation

## 2014-09-02 DIAGNOSIS — Z79899 Other long term (current) drug therapy: Secondary | ICD-10-CM

## 2014-09-02 DIAGNOSIS — G8114 Spastic hemiplegia affecting left nondominant side: Secondary | ICD-10-CM

## 2014-09-02 MED ORDER — HYDROCODONE-ACETAMINOPHEN 10-325 MG PO TABS: 1.0000 | ORAL_TABLET | Freq: Three times a day (TID) | ORAL | Status: DC | PRN

## 2014-09-02 MED ORDER — HYDROCODONE-ACETAMINOPHEN 10-325 MG PO TABS
1.0000 | ORAL_TABLET | Freq: Three times a day (TID) | ORAL | Status: DC | PRN
Start: 2014-09-02 — End: 2014-10-30

## 2014-09-02 MED ORDER — PREDNISONE 20 MG PO TABS: 20.0000 mg | ORAL_TABLET | ORAL | Status: DC

## 2014-09-02 NOTE — Progress Notes (Signed)
Subjective:    Patient ID: Kendra [name], female    DOB: [birth year], 53 y.o.   MRN: [id number]  HPI  Kendra [name] is back regarding her chronic pain and neuro-muscular issues. She feels that the botox we did over the summer was helpful for her left shoulder and arm pain as well as the movement disorder. She is having increased low back pain with radiaiton into the right leg. She wasn't able to have surgery because of her bladder cancer. She is in process of starting washings for her bladder----perhaps in the next week or two.   Her mother has become very ill. It sounds as if she is in renal failure but refuses to go on HD.   Kendra [name] feels that the hydrocodone helps with her pain more than anything else. She seems to tolerate it pretty well.   Pain Inventory Average Pain 8 Pain Right Now 8 My pain is constant  In the last 24 hours, has pain interfered with the following? General activity 6 Relation with others 8 Enjoyment of life 10 What TIME of day is your pain at its worst? morning, daytime, evening, night Sleep (in general) Fair  Pain is worse with: walking, bending, standing and some activites Pain improves with: rest and medication Relief from Meds: 6  Mobility use a cane how many minutes can you walk? very little ability to climb steps?  yes do you drive?  yes Do you have any goals in this area?  yes  Function disabled: date disabled . I need assistance with the following:  meal prep, household duties and shopping  Neuro/Psych weakness tingling spasms dizziness  Prior Studies Any changes since last visit?  no x-rays CT/MRI  Physicians involved in your care Any changes since last visit?  no   Family History  Problem Relation Age of Onset  . Diabetes Mother   . Cancer Father   . Cancer Maternal Grandfather    History   Social History  . Marital Status: Single    Spouse Name: N/A    Number of Children: N/A  . Years of Education: N/A   Social History Main  Topics  . Smoking status: Never Smoker   . Smokeless tobacco: Never Used  . Alcohol Use: No  . Drug Use: No  . Sexual Activity: None   Other Topics Concern  . None   Social History Narrative   Past Surgical History  Procedure Laterality Date  . Spine surgery  2003, 2004  . Cholecystectomy  mid-1990's  . Brain surgery  1977    for tremors  . Lipoma excision Right     hip   Past Medical History  Diagnosis Date  . Osteoarthritis   . Muscle pain   . Anxiety   . History of kidney stones   . Hypertension   . Hematuria 7/15  . GERD (gastroesophageal reflux disease)   . Seizures     none in yrs- except for  "staring type " freq   BP 120/71 mmHg  Pulse 80  Resp 14  SpO2 95%  Opioid Risk Score:   Fall Risk Score: Moderate Fall Risk (6-13 points)  Review of Systems  Cardiovascular: Positive for leg swelling.  Neurological: Positive for dizziness and weakness.       Tingling   spasms       Objective:   Physical Exam  Constitutional: She is oriented to person, place, and time. She appears well-developed and well-nourished.  HENT:  Head: Normocephalic.  Eyes: EOM are normal. Pupils are equal, round, and reactive to light.  Neck: Normal range of motion.  Cardiovascular: Normal rate and regular rhythm.  Pulmonary/Chest: Effort normal and breath sounds normal.  Abdominal: Soft. She exhibits no distension. There is no tenderness.  Musculoskeletal:  Low back tender with flexion and extension. Head forward, slouched posture. Neurological: She is alert and oriented to person, place, and time.  decreased dsykinesias of the left upper ext. Minimal resting tone is seen. If  Strength is at baseline in the LE's. Grossly 4/5. (limited at left ankle, trace to 1). DTR's 1+ in the le's  Speech slurred at baseline. Cognition is appropriate. She is alert.  Skin: Skin is warm.  Psychiatric: She has a normal mood and affect. Her behavior is normal. Judgment and thought content  normal. She is pleasant as always.    Assessment & Plan:   ASSESSMENT:  1. Traumatic brain injury with hx of dyskinesias on the left,  arthritis involving both ankles, knees, and left shoulder. The  patient with associated rotator cuff tendinitis on the  Left as well.  2. Seizure disorder.  3. Low back pain, post-laminectomy syndrome. Severe disc disease at L2-3 just above op site.  4. Reactive depression.  5. Right greater trochanteric bursitis  6. Bladder cancer  7. Hypotension/ orthostatic hypotension --improved  8. OA of bilateral knees    PLAN:  1 .Lumbar fusion per Dr. Annette Stable next year? 2. Bladder washing per urology  3. Voltaren gel to be use tid to knees  4. Prednisone taper for back and RLE radiculopathy 5. Hydrocodone ---increase to 10/325 q8 prn.  #90. A second rx was given for next month. 6. 15 minutes of face to face patient care time were spent during this visit. All questions were encouraged and answered.

## 2014-09-02 NOTE — Patient Instructions (Signed)
PLEASE CALL ME WITH ANY PROBLEMS OR QUESTIONS (#[phone]).      

## 2014-09-02 NOTE — Addendum Note (Signed)
Addended by: Valeria Batman on: 09/02/2014 12:43 PM   Modules accepted: Orders

## 2014-09-03 LAB — PMP ALCOHOL METABOLITE (ETG): Ethyl Glucuronide (EtG): NEGATIVE ng/mL

## 2014-09-05 LAB — OPIATES/OPIOIDS (LC/MS-MS)
Codeine Urine: NEGATIVE ng/mL (ref <50)
HYDROMORPHONE: 2057 ng/mL (ref <50)
Hydrocodone: 8177 ng/mL (ref <50)
Morphine Urine: NEGATIVE ng/mL (ref <50)
NOROXYCODONE, UR: NEGATIVE ng/mL (ref <50)
Norhydrocodone, Ur: 9021 ng/mL (ref <50)
OXYCODONE, UR: NEGATIVE ng/mL (ref <50)
Oxymorphone: NEGATIVE ng/mL (ref <50)

## 2014-09-05 LAB — BENZODIAZEPINES (GC/LC/MS), URINE
Alprazolam metabolite (GC/LC/MS), ur confirm: 261 ng/mL — AB (ref <25)
CLONAZEPAU: 1517 ng/mL — AB (ref <25)
Flurazepam metabolite (GC/LC/MS), ur confirm: NEGATIVE ng/mL (ref <50)
LORAZEPAMU: NEGATIVE ng/mL (ref <50)
Midazolam (GC/LC/MS), ur confirm: NEGATIVE ng/mL (ref <50)
Nordiazepam (GC/LC/MS), ur confirm: NEGATIVE ng/mL (ref <50)
OXAZEPAMU: NEGATIVE ng/mL (ref <50)
TRIAZOLAMU: NEGATIVE ng/mL (ref <50)
Temazepam (GC/LC/MS), ur confirm: NEGATIVE ng/mL (ref <50)

## 2014-09-06 LAB — PRESCRIPTION MONITORING PROFILE (SOLSTAS)
Amphetamine/Meth: NEGATIVE ng/mL
BARBITURATE SCREEN, URINE: NEGATIVE ng/mL
Buprenorphine, Urine: NEGATIVE ng/mL
CANNABINOID SCRN UR: NEGATIVE ng/mL
CARISOPRODOL, URINE: NEGATIVE ng/mL
COCAINE METABOLITES: NEGATIVE ng/mL
CREATININE, URINE: 249.37 mg/dL (ref >20.0)
Fentanyl, Ur: NEGATIVE ng/mL
MDMA URINE: NEGATIVE ng/mL
MEPERIDINE UR: NEGATIVE ng/mL
Methadone Screen, Urine: NEGATIVE ng/mL
Nitrites, Initial: NEGATIVE ug/mL
Oxycodone Screen, Ur: NEGATIVE ng/mL
Propoxyphene: NEGATIVE ng/mL
TAPENTADOLUR: NEGATIVE ng/mL
Tramadol Scrn, Ur: NEGATIVE ng/mL
Zolpidem, Urine: NEGATIVE ng/mL
pH, Initial: 5.2 pH (ref 4.5–8.9)

## 2014-09-08 ENCOUNTER — Other Ambulatory Visit: Payer: Self-pay | Admitting: Physical Medicine & Rehabilitation

## 2014-09-09 NOTE — Telephone Encounter (Signed)
Refill request for meloxicam for Kendra [name].  I dont see where it has been ordered since 01/2014 with 3 refills.  Do you want to refill this?

## 2014-09-18 ENCOUNTER — Telehealth: Payer: Self-pay | Admitting: *Deleted

## 2014-09-18 NOTE — Telephone Encounter (Signed)
Kendra [name] called requesting refill on her klonopin.   No recent refill.  Do you want to refill this?  Must be phoned in so send back to clinical pool as I will not be in office 09/19/14.

## 2014-09-20 MED ORDER — CLONAZEPAM 1 MG PO TABS: 0.5000 mg | ORAL_TABLET | Freq: Three times a day (TID) | ORAL | Status: DC

## 2014-09-20 NOTE — Telephone Encounter (Signed)
Notified Anieya this was called to the pharmacy

## 2014-09-20 NOTE — Telephone Encounter (Signed)
Yes may refill x 3

## 2014-09-20 NOTE — Telephone Encounter (Signed)
Patient called again about refills.  Dr. Naaman Plummer ok'd with 3 refills

## 2014-10-07 ENCOUNTER — Other Ambulatory Visit: Payer: Self-pay | Admitting: Physical Medicine & Rehabilitation

## 2014-10-30 ENCOUNTER — Encounter: Payer: Self-pay | Admitting: Physical Medicine & Rehabilitation

## 2014-10-30 ENCOUNTER — Encounter: Payer: Medicare HMO | Attending: Physical Medicine & Rehabilitation | Admitting: Physical Medicine & Rehabilitation

## 2014-10-30 VITALS — BP 125/83 | HR 98 | Resp 14

## 2014-10-30 DIAGNOSIS — M13872 Other specified arthritis, left ankle and foot: Secondary | ICD-10-CM | POA: Insufficient documentation

## 2014-10-30 DIAGNOSIS — M75102 Unspecified rotator cuff tear or rupture of left shoulder, not specified as traumatic: Secondary | ICD-10-CM | POA: Diagnosis not present

## 2014-10-30 DIAGNOSIS — M13871 Other specified arthritis, right ankle and foot: Secondary | ICD-10-CM | POA: Insufficient documentation

## 2014-10-30 DIAGNOSIS — M13862 Other specified arthritis, left knee: Secondary | ICD-10-CM | POA: Diagnosis not present

## 2014-10-30 DIAGNOSIS — S069X0S Unspecified intracranial injury without loss of consciousness, sequela: Secondary | ICD-10-CM | POA: Insufficient documentation

## 2014-10-30 DIAGNOSIS — M5416 Radiculopathy, lumbar region: Secondary | ICD-10-CM | POA: Diagnosis not present

## 2014-10-30 DIAGNOSIS — M7061 Trochanteric bursitis, right hip: Secondary | ICD-10-CM | POA: Diagnosis not present

## 2014-10-30 DIAGNOSIS — M13861 Other specified arthritis, right knee: Secondary | ICD-10-CM | POA: Diagnosis not present

## 2014-10-30 DIAGNOSIS — M25512 Pain in left shoulder: Secondary | ICD-10-CM | POA: Diagnosis not present

## 2014-10-30 DIAGNOSIS — M961 Postlaminectomy syndrome, not elsewhere classified: Secondary | ICD-10-CM | POA: Diagnosis not present

## 2014-10-30 DIAGNOSIS — I951 Orthostatic hypotension: Secondary | ICD-10-CM | POA: Diagnosis not present

## 2014-10-30 DIAGNOSIS — M545 Low back pain: Secondary | ICD-10-CM | POA: Diagnosis not present

## 2014-10-30 DIAGNOSIS — M170 Bilateral primary osteoarthritis of knee: Secondary | ICD-10-CM | POA: Insufficient documentation

## 2014-10-30 DIAGNOSIS — G249 Dystonia, unspecified: Secondary | ICD-10-CM | POA: Insufficient documentation

## 2014-10-30 DIAGNOSIS — G8110 Spastic hemiplegia affecting unspecified side: Secondary | ICD-10-CM | POA: Diagnosis not present

## 2014-10-30 DIAGNOSIS — M79602 Pain in left arm: Secondary | ICD-10-CM | POA: Diagnosis not present

## 2014-10-30 DIAGNOSIS — C679 Malignant neoplasm of bladder, unspecified: Secondary | ICD-10-CM | POA: Insufficient documentation

## 2014-10-30 DIAGNOSIS — M13812 Other specified arthritis, left shoulder: Secondary | ICD-10-CM | POA: Insufficient documentation

## 2014-10-30 DIAGNOSIS — G8114 Spastic hemiplegia affecting left nondominant side: Secondary | ICD-10-CM

## 2014-10-30 MED ORDER — HYDROCODONE-ACETAMINOPHEN 5-325 MG PO TABS: 0.5000 | ORAL_TABLET | Freq: Four times a day (QID) | ORAL | Status: DC | PRN

## 2014-10-30 NOTE — Progress Notes (Signed)
Subjective:    Patient ID: Kendra [name], female    DOB: 11-04-1960, 54 y.o.   MRN: [id number]  HPI   Kendra [name] is back regarding her chronic pain. She is having more right knee pain as well. She doesn't feel that it's radiating from her back.   Her back is painful, but she doesn't feel that it has advanced any more from the last Fall.   Unfortunately, her mother passed away after a long battle with cancer. She has an aid with her today.    She is interested in getting a powered lift chair to help her with transfers.   She is using hydrocodone for pain but the 7.5mg  dose is still sedating for her.    Pain Inventory Average Pain 7 Pain Right Now 7 My pain is sharp  In the last 24 hours, has pain interfered with the following? General activity 10 Relation with others 10 Enjoyment of life 10 What TIME of day is your pain at its worst? daytime Sleep (in general) Poor  Pain is worse with: walking, bending, sitting, inactivity, standing, unsure and some activites Pain improves with: rest, medication and injections Relief from Meds: n/a  Mobility walk with assistance use a cane use a walker ability to climb steps?  no do you drive?  no use a wheelchair needs help with transfers  Function disabled: date disabled .  Neuro/Psych weakness numbness tremor tingling trouble walking spasms dizziness depression anxiety  Prior Studies Any changes since last visit?  no  Physicians involved in your care Any changes since last visit?  no   Family History  Problem Relation Age of Onset  . Diabetes Mother   . Cancer Father   . Cancer Maternal Grandfather    History   Social History  . Marital Status: Single    Spouse Name: N/A    Number of Children: N/A  . Years of Education: N/A   Social History Main Topics  . Smoking status: Never Smoker   . Smokeless tobacco: Never Used  . Alcohol Use: No  . Drug Use: No  . Sexual Activity: None   Other Topics Concern    . None   Social History Narrative   Past Surgical History  Procedure Laterality Date  . Spine surgery  2003, 2004  . Cholecystectomy  mid-1990's  . Brain surgery  1977    for tremors  . Lipoma excision Right     hip   Past Medical History  Diagnosis Date  . Osteoarthritis   . Muscle pain   . Anxiety   . History of kidney stones   . Hypertension   . Hematuria 7/15  . GERD (gastroesophageal reflux disease)   . Seizures     none in yrs- except for  "staring type " freq   BP 125/83 mmHg  Pulse 98  Resp 14  SpO2 92%  Opioid Risk Score:   Fall Risk Score: High Fall Risk (>13 points) (previously educated and given handout.  Falls related to medications)  Review of Systems  Musculoskeletal: Positive for gait problem.       Spasms  Neurological: Positive for dizziness, tremors, weakness and numbness.       Tingling  Psychiatric/Behavioral: Positive for dysphoric mood. The patient is nervous/anxious.   All other systems reviewed and are negative.      Objective:   Physical Exam   Constitutional: She is oriented to person, place, and time. She appears well-developed and well-nourished.  HENT:  Head: Normocephalic.  Eyes: EOM are normal. Pupils are equal, round, and reactive to light.  Neck: Normal range of motion.  Cardiovascular: Normal rate and regular rhythm.  Pulmonary/Chest: Effort normal and breath sounds normal.  Abdominal: Soft. She exhibits no distension. There is no tenderness.  Musculoskeletal:  Low back tender with flexion and extension. Right knee with mild crepitus and effusion, joint line pain medially more than laterally Neurological: She is alert and oriented to person, place, and time.  decreased dsykinesias of the left upper ext. Minimal resting tone is seen. If Strength is at baseline in the LE's. Grossly 4/5. (limited at left ankle, trace to 1). DTR's 1+ in the le's  Speech slurred at baseline. Cognition is appropriate. She is alert.  Skin:  Skin is warm.  Psychiatric: She has a normal mood and affect. Her behavior is normal. Judgment and thought content normal. She is pleasant as always.    Assessment & Plan:   ASSESSMENT:  1. Traumatic brain injury with hx of dyskinesias on the left,  arthritis involving both ankles, knees, and left shoulder. The  patient with associated rotator cuff tendinitis on the  Left as well.  2. Seizure disorder.  3. Low back pain, post-laminectomy syndrome. Severe disc disease at L2-3 just above op site.  4. Reactive depression.  5. Right greater trochanteric bursitis  6. Bladder cancer  7. Hypotension/ orthostatic hypotension --improved  8. OA of bilateral knees    PLAN:  1 .Lumbar fusion per Dr. Annette Stable perhaps later this year.    2. After informed consent and preparation of the skin with betadine and isopropyl alcohol, I injected 6mg  (1cc) of celestone and 4cc of 1% lidocaine into the right knee via medial approach. Additionally, aspiration was performed prior to injection. The patient tolerated well, and no complications were encountered. Afterward the area was cleaned and dressed. Post- injection instructions were provided.  3. Voltaren gel to be use tid to knees  4. HEP 5. Hydrocodone ---decrease to 5mg  0.5-1 q8 prn to help with arousal/sedation. A second rx was given for next month.  6. 15 minutes of face to face patient care time were spent during this visit. All questions were encouraged and answered.

## 2014-10-30 NOTE — Patient Instructions (Signed)
PLEASE CALL ME WITH ANY PROBLEMS OR QUESTIONS (#[phone]).      

## 2014-11-10 ENCOUNTER — Other Ambulatory Visit: Payer: Self-pay | Admitting: Physical Medicine & Rehabilitation

## 2014-11-20 ENCOUNTER — Telehealth: Payer: Self-pay | Admitting: *Deleted

## 2014-11-20 NOTE — Telephone Encounter (Signed)
I apologize for delayed posting, I needed to call back and get some clarification about phone call before I posted. [name] is [employer] sister and she is calling with hopes of having a discussion with you prior to Pt's upcoming appt in March. She is hoping to get your qualified opinion on what kind of living situation would be best for Kendra [name]. It sounds like this has been discussed before. She said that it has been discussed between a facility that is geared towards the handicap with more independent living vs.  special assistance facilty that has in-house total assisted living care. She mentioned that the mother has passed away and she is now taking the brunt of responsibility for her sister. She also made mention to the possibility that [name] may not get coverage from [facility] unless she is in a total assisted living environment. [name] can be reached at [phone]

## 2014-12-18 ENCOUNTER — Other Ambulatory Visit: Payer: Self-pay | Admitting: Neurosurgery

## 2014-12-30 ENCOUNTER — Encounter: Payer: Self-pay | Admitting: Registered Nurse

## 2014-12-30 ENCOUNTER — Encounter: Payer: Commercial Managed Care - HMO | Attending: Physical Medicine & Rehabilitation | Admitting: Registered Nurse

## 2014-12-30 VITALS — BP 125/71 | HR 16

## 2014-12-30 DIAGNOSIS — M13871 Other specified arthritis, right ankle and foot: Secondary | ICD-10-CM | POA: Insufficient documentation

## 2014-12-30 DIAGNOSIS — M961 Postlaminectomy syndrome, not elsewhere classified: Secondary | ICD-10-CM | POA: Insufficient documentation

## 2014-12-30 DIAGNOSIS — Z79899 Other long term (current) drug therapy: Secondary | ICD-10-CM

## 2014-12-30 DIAGNOSIS — M170 Bilateral primary osteoarthritis of knee: Secondary | ICD-10-CM

## 2014-12-30 DIAGNOSIS — G249 Dystonia, unspecified: Secondary | ICD-10-CM | POA: Diagnosis not present

## 2014-12-30 DIAGNOSIS — M7061 Trochanteric bursitis, right hip: Secondary | ICD-10-CM | POA: Diagnosis not present

## 2014-12-30 DIAGNOSIS — I951 Orthostatic hypotension: Secondary | ICD-10-CM | POA: Insufficient documentation

## 2014-12-30 DIAGNOSIS — S069X0S Unspecified intracranial injury without loss of consciousness, sequela: Secondary | ICD-10-CM | POA: Insufficient documentation

## 2014-12-30 DIAGNOSIS — Z5181 Encounter for therapeutic drug level monitoring: Secondary | ICD-10-CM

## 2014-12-30 DIAGNOSIS — M545 Low back pain: Secondary | ICD-10-CM | POA: Insufficient documentation

## 2014-12-30 DIAGNOSIS — M13861 Other specified arthritis, right knee: Secondary | ICD-10-CM | POA: Insufficient documentation

## 2014-12-30 DIAGNOSIS — M75102 Unspecified rotator cuff tear or rupture of left shoulder, not specified as traumatic: Secondary | ICD-10-CM | POA: Diagnosis not present

## 2014-12-30 DIAGNOSIS — C679 Malignant neoplasm of bladder, unspecified: Secondary | ICD-10-CM | POA: Insufficient documentation

## 2014-12-30 DIAGNOSIS — S069X0D Unspecified intracranial injury without loss of consciousness, subsequent encounter: Secondary | ICD-10-CM

## 2014-12-30 DIAGNOSIS — M13812 Other specified arthritis, left shoulder: Secondary | ICD-10-CM | POA: Diagnosis not present

## 2014-12-30 DIAGNOSIS — G8110 Spastic hemiplegia affecting unspecified side: Secondary | ICD-10-CM

## 2014-12-30 DIAGNOSIS — M13872 Other specified arthritis, left ankle and foot: Secondary | ICD-10-CM | POA: Diagnosis not present

## 2014-12-30 DIAGNOSIS — M13862 Other specified arthritis, left knee: Secondary | ICD-10-CM | POA: Insufficient documentation

## 2014-12-30 DIAGNOSIS — M25512 Pain in left shoulder: Secondary | ICD-10-CM | POA: Diagnosis not present

## 2014-12-30 DIAGNOSIS — M5416 Radiculopathy, lumbar region: Secondary | ICD-10-CM

## 2014-12-30 DIAGNOSIS — G8114 Spastic hemiplegia affecting left nondominant side: Secondary | ICD-10-CM

## 2014-12-30 DIAGNOSIS — M79602 Pain in left arm: Secondary | ICD-10-CM | POA: Diagnosis present

## 2014-12-30 MED ORDER — HYDROCODONE-ACETAMINOPHEN 5-325 MG PO TABS: 0.5000 | ORAL_TABLET | Freq: Four times a day (QID) | ORAL | Status: DC | PRN

## 2014-12-30 NOTE — Progress Notes (Signed)
Subjective:    Patient ID: Kendra [name], female    DOB: 11/24/1960, 54 y.o.   MRN: [id number]  HPI: [name] is a 54 year old female who returns for follow up for chronic pain and medication refill. She says her pain is located in her lower back and right hip. She rates her pain 8. Her current exercise regime is transferring from bed to chair  and performing stretching exercises. She's  usually in her wheelchair. Her sister in room all questions were answered and emotional support given. She's scheduled for Posterior Lumbar Fusion Level 1 on 02/11/15 by Dr. Annette Stable.  Pain Inventory Average Pain 8 Pain Right Now 8 My pain is sharp and stabbing  In the last 24 hours, has pain interfered with the following? General activity 2 Relation with others 5 Enjoyment of life 5 What TIME of day is your pain at its worst? daytime evening and night Sleep (in general) Fair  Pain is worse with: walking, bending, standing and some activites Pain improves with: rest and medication Relief from Meds: 7  Mobility use a cane ability to climb steps?  no do you drive?  no use a wheelchair needs help with transfers  Function disabled: date disabled . I need assistance with the following:  meal prep, household duties and shopping  Neuro/Psych trouble walking spasms dizziness  Prior Studies Any changes since last visit?  no  Physicians involved in your care [place]   Family History  Problem Relation Age of Onset  . Diabetes Mother   . Cancer Father   . Cancer Maternal Grandfather    History   Social History  . Marital Status: Single    Spouse Name: N/A  . Number of Children: N/A  . Years of Education: N/A   Social History Main Topics  . Smoking status: Never Smoker   . Smokeless tobacco: Never Used  . Alcohol Use: No  . Drug Use: No  . Sexual Activity: Not on file   Other Topics Concern  . None   Social History Narrative   Past Surgical History    Procedure Laterality Date  . Spine surgery  2003, 2004  . Cholecystectomy  mid-1990's  . Brain surgery  1977    for tremors  . Lipoma excision Right     hip   Past Medical History  Diagnosis Date  . Osteoarthritis   . Muscle pain   . Anxiety   . History of kidney stones   . Hypertension   . Hematuria 7/15  . GERD (gastroesophageal reflux disease)   . Seizures     none in yrs- except for  "staring type " freq   BP 125/71 mmHg  Pulse 16  SpO2 94%  Opioid Risk Score:   Fall Risk Score: High Fall Risk (>13 points) (previoulsy educated and given handout)  Review of Systems  Musculoskeletal: Positive for gait problem.       Spasms  Neurological: Positive for dizziness.  All other systems reviewed and are negative.      Objective:   Physical Exam  Constitutional: She is oriented to person, place, and time. She appears well-developed and well-nourished.  HENT:  Head: Normocephalic and atraumatic.  Neck: Normal range of motion. Neck supple.  Cardiovascular: Normal rate and regular rhythm.   Pulmonary/Chest: Effort normal and breath sounds normal.  Musculoskeletal:  Normal Muscle Bulk and Muscle Testing Reveals: Upper Extremities: Right: Full ROM and Muscle Strength 5/5 Left: Decreased  ROM 45 Degrees and Muscle Strength 4/5 Lumbar Paraspinal tenderness: L-3- L-5 Lower Extremities: Right Lower extremity Flexion Produces pain into Patella Left Lower Extremity Brace Intact  Arrived in wheelchair   Neurological: She is alert and oriented to person, place, and time.  Skin: Skin is warm and dry.  Psychiatric: She has a normal mood and affect.  Nursing note and vitals reviewed.         Assessment & Plan:  1. 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 Seizure  disorder: Continue Keppra 4. Low back pain, post-laminectomy syndrome. Severe disc disease at L2-3 just above op site: Lumbar Fusion Schedule for April 26/16 via:Dr. Trenton Gammon. Refilled: Hydrocodone 5/325mg  one tablet every 6 hours as needed for moderate pain.#70. Second script given. 5. Reactive depression: Continue to Monitor 6. Right greater trochanteric bursitis: Continue Voltaren Gel and Heat Therapy. 7. OA of bilateral knees: Continue Voltaren gel and Heat Therapy.  30 minutes of face to face patient care time was spent during this visit. All questions were encouraged and answered.   F/U in 2 months

## 2015-01-05 ENCOUNTER — Other Ambulatory Visit: Payer: Self-pay | Admitting: Physical Medicine & Rehabilitation

## 2015-01-06 ENCOUNTER — Other Ambulatory Visit: Payer: Self-pay

## 2015-01-06 NOTE — Telephone Encounter (Signed)
CSW contacted patient with intent of confirming completion of advanced directives. Patient reported that advanced directives were not completed but still had plans to complete. CSW asked if patient had contacted [facility] for adult day care. Patient reported that they had not contacted [facility]. Patient reported that they had not contacted [facility] regarding [facility]. CSW discussed working towards goals in care plan. Patient reported they were very busy with several appointments and other things and did not have time to work on goals. Patient reported that they could manage on their own and would not need continued follow-up. Patient confirmed discharge due to not wanting to participate in care plan.

## 2015-01-06 NOTE — Patient Outreach (Signed)
CSW called patient at home. CSW asked regarding progress with goals. Patient reported that there had been no progress with goals and did not need further follow-up on them and that the advanced directives would be completed when she had the ability to finish it. Patient chose to discharge services as there was no need to follow-up.

## 2015-01-25 ENCOUNTER — Other Ambulatory Visit: Payer: Self-pay | Admitting: Physical Medicine & Rehabilitation

## 2015-01-27 NOTE — Telephone Encounter (Signed)
I was uncertain about this refill on meloxicam since she has voltaren gel as well.

## 2015-01-31 ENCOUNTER — Encounter (HOSPITAL_COMMUNITY)
Admission: RE | Admit: 2015-01-31 | Discharge: 2015-01-31 | Disposition: A | Discharge status: Home / Self Care | Payer: Commercial Managed Care - HMO | Source: Ambulatory Visit | Attending: Neurosurgery | Admitting: Neurosurgery

## 2015-01-31 ENCOUNTER — Encounter (HOSPITAL_COMMUNITY): Payer: Self-pay

## 2015-01-31 ENCOUNTER — Other Ambulatory Visit (HOSPITAL_COMMUNITY): Payer: Self-pay | Admitting: *Deleted

## 2015-01-31 HISTORY — DX: Nocturia: R35.1

## 2015-01-31 HISTORY — DX: Dorsalgia, unspecified: M54.9

## 2015-01-31 HISTORY — DX: Pain in unspecified joint: M25.50

## 2015-01-31 HISTORY — DX: Other chronic pain: G89.29

## 2015-01-31 HISTORY — DX: Effusion, unspecified joint: M25.40

## 2015-01-31 HISTORY — DX: Headache: R51

## 2015-01-31 HISTORY — DX: Irritable bowel syndrome, unspecified: K58.9

## 2015-01-31 HISTORY — DX: Personal history of other diseases of the respiratory system: Z87.09

## 2015-01-31 HISTORY — DX: Headache, unspecified: R51.9

## 2015-01-31 HISTORY — DX: Personal history of other medical treatment: Z92.89

## 2015-01-31 HISTORY — DX: Disorder of brain, unspecified: G93.9

## 2015-01-31 LAB — CBC WITH DIFFERENTIAL/PLATELET
Basophils Absolute: 0 10*3/uL (ref 0.0–0.1)
Basophils Relative: 0 % (ref 0–1)
EOS ABS: 0.1 10*3/uL (ref 0.0–0.7)
EOS PCT: 2 % (ref 0–5)
HCT: 44.9 % (ref 36.0–46.0)
HEMOGLOBIN: 14.8 g/dL (ref 12.0–15.0)
Lymphocytes Relative: 41 % (ref 12–46)
Lymphs Abs: 3.3 10*3/uL (ref 0.7–4.0)
MCH: 30.2 pg (ref 26.0–34.0)
MCHC: 33 g/dL (ref 30.0–36.0)
MCV: 91.6 fL (ref 78.0–100.0)
MONO ABS: 0.3 10*3/uL (ref 0.1–1.0)
MONOS PCT: 4 % (ref 3–12)
Neutro Abs: 4.2 10*3/uL (ref 1.7–7.7)
Neutrophils Relative %: 53 % (ref 43–77)
PLATELETS: 340 10*3/uL (ref 150–400)
RBC: 4.9 MIL/uL (ref 3.87–5.11)
RDW: 13.2 % (ref 11.5–15.5)
WBC: 7.9 10*3/uL (ref 4.0–10.5)

## 2015-01-31 LAB — SURGICAL PCR SCREEN
MRSA, PCR: NEGATIVE
STAPHYLOCOCCUS AUREUS: NEGATIVE

## 2015-01-31 LAB — BASIC METABOLIC PANEL
Anion gap: 13 (ref 5–15)
BUN: 17 mg/dL (ref 6–23)
CHLORIDE: 101 mmol/L (ref 96–112)
CO2: 24 mmol/L (ref 19–32)
Calcium: 9.1 mg/dL (ref 8.4–10.5)
Creatinine, Ser: 0.72 mg/dL (ref 0.50–1.10)
GFR calc Af Amer: >90 mL/min (ref >90)
GFR calc non Af Amer: >90 mL/min (ref >90)
GLUCOSE: 182 mg/dL — AB (ref 70–99)
Potassium: 3.7 mmol/L (ref 3.5–5.1)
SODIUM: 138 mmol/L (ref 135–145)

## 2015-01-31 LAB — TYPE AND SCREEN
ABO/RH(D): A POS
Antibody Screen: NEGATIVE

## 2015-01-31 LAB — HCG, SERUM, QUALITATIVE: Preg, Serum: NEGATIVE

## 2015-01-31 NOTE — Progress Notes (Addendum)
Pt doesn't have a Cardiologist  Echo report in epic from 2013  Stress test done > 4 yrs +ago  Denies ever having a Heart cath  EKG and CXR in epic from 05-07-14  Medical Md is Dr.Keung [place]

## 2015-01-31 NOTE — Pre-Procedure Instructions (Signed)
Kendra [name]  01/31/2015   Your procedure is scheduled on:  Tues, April 26 @ 8:00 AM  Report to [facility]  at 6:00 AM.  Call this number if you have problems the morning of surgery: [phone]   Remember:   Do not eat food or drink liquids after midnight.   Take these medicines the morning of surgery with A SIP OF WATER: Alprazolam(Xanax),Klonopin(Clonazepam),Flonase(Fluticasone-if needed),Pain Pill(if needed),Keppra(Levetiracetam),Metoprolol(Toprol),and Omeprazole(Prilosec)             Stop taking your Mobic. No Goody's,BC's,Aleve,Aspirin,Ibuprofen,Fish Oil,or any Herbal Medications.    Do not wear jewelry, make-up or nail polish.  Do not wear lotions, powders, or perfumes. You may wear deodorant.  Do not shave 48 hours prior to surgery.   Do not bring valuables to the hospital.  [facility] is not responsible                  for any belongings or valuables.               Contacts, dentures or bridgework may not be worn into surgery.  Leave suitcase in the car. After surgery it may be brought to your room.  For patients admitted to the hospital, discharge time is determined by your                treatment team.                Special Instructions:  [place] - Preparing for Surgery  Before surgery, you can play an important role.  Because skin is not sterile, your skin needs to be as free of germs as possible.  You can reduce the number of germs on you skin by washing with CHG (chlorahexidine gluconate) soap before surgery.  CHG is an antiseptic cleaner which kills germs and bonds with the skin to continue killing germs even after washing.  Please DO NOT use if you have an allergy to CHG or antibacterial soaps.  If your skin becomes reddened/irritated stop using the CHG and inform your nurse when you arrive at Short Stay.  Do not shave (including legs and underarms) for at least 48 hours prior to the first CHG shower.  You may shave your face.  Please follow these  instructions carefully:   1.  Shower with CHG Soap the night before surgery and the                                morning of Surgery.  2.  If you choose to wash your hair, wash your hair first as usual with your       normal shampoo.  3.  After you shampoo, rinse your hair and body thoroughly to remove the                      Shampoo.  4.  Use CHG as you would any other liquid soap.  You can apply chg directly       to the skin and wash gently with scrungie or a clean washcloth.  5.  Apply the CHG Soap to your body ONLY FROM THE NECK DOWN.        Do not use on open wounds or open sores.  Avoid contact with your eyes,       ears, mouth and genitals (private parts).  Wash genitals (private parts)  with your normal soap.  6.  Wash thoroughly, paying special attention to the area where your surgery        will be performed.  7.  Thoroughly rinse your body with warm water from the neck down.  8.  DO NOT shower/wash with your normal soap after using and rinsing off       the CHG Soap.  9.  Pat yourself dry with a clean towel.            10.  Wear clean pajamas.            11.  Place clean sheets on your bed the night of your first shower and do not        sleep with pets.  Day of Surgery  Do not apply any lotions/deoderants the morning of surgery.  Please wear clean clothes to the hospital/surgery center.     Please read over the following fact sheets that you were given: Pain Booklet, Coughing and Deep Breathing, Blood Transfusion Information, MRSA Information and Surgical Site Infection Prevention

## 2015-02-03 NOTE — Progress Notes (Signed)
Anesthesia Note:  See my note from 05/08/14. She was initially scheduled for L2-3 PLIF on 05/20/14, but for unclear reasons she did not have the surgery.  Now surgery is scheduled with Dr. Annette Stable for 02/11/15.  She has a signed medical clearance note from Dr. Nila Nephew.    History includes non-smoker, nephrolithiasis, HTN, GERD, anxiety, IBS, seizures (on Keppra), brain surgery for tremors '77, cholecystectomy, spinal surgery '03 and '04, lipoma excision. She receives Botox injections for spastic hemiparesis, LUE by Dr. Alger Simons. Notes in Epic indicate a history of a traumatic brain injury in 1972 with persistent dyskinesias on the left. BMI is consistent with obesity. PCP is Dr. Cher Nakai with [facility].   EKG on 05/07/14 showed: NSR, LAD, anterolateral infarct (age undetermined). No significant change since her last tracings in [facility] from 08/06/03 and 06/29/02. She thought she had a stress test > 4 years ago. [facility] had no record of prior EKG or cardiac studies when we contacted them in 04/2014. No documented CAD, MI, CHF, DM, or chest pain history from her PAT visit.  Preoperative labs noted.Non-fasting glucose 182 (no history of DM). Check CBG on arrival.   Her EKG has been stable for 10 plus years. She has a medical clearance from Dr. Nila Nephew.  Based on currently available information, I would anticipate that she could proceed as planned in If no acute changes or new CV symptomology when evaluated by her anesthesiologist on the day of surgery. Anesthesiologist Dr. Jenita Seashore agrees with this plan.  George Hugh [facility] Short Stay Center/Anesthesiology Phone [phone] 02/03/2015 5:07 PM

## 2015-02-10 MED ORDER — DEXAMETHASONE SODIUM PHOSPHATE 10 MG/ML IJ SOLN
10.0000 mg | INTRAMUSCULAR | Status: AC
  Administered 2015-02-11: 10 mg via INTRAVENOUS
  Filled 2015-02-10: qty 1

## 2015-02-10 MED ORDER — VANCOMYCIN HCL IN DEXTROSE 1-5 GM/200ML-% IV SOLN
1000.0000 mg | INTRAVENOUS | Status: AC
  Administered 2015-02-11: 1000 mg via INTRAVENOUS
  Filled 2015-02-10: qty 200

## 2015-02-11 ENCOUNTER — Inpatient Hospital Stay (HOSPITAL_COMMUNITY): Payer: Commercial Managed Care - HMO | Admitting: Vascular Surgery

## 2015-02-11 ENCOUNTER — Encounter (HOSPITAL_COMMUNITY)
Admission: RE | Disposition: A | Discharge status: Skilled Nursing Facility | Payer: Commercial Managed Care - HMO | Source: Ambulatory Visit | Attending: Neurosurgery

## 2015-02-11 ENCOUNTER — Inpatient Hospital Stay (HOSPITAL_COMMUNITY): Payer: Commercial Managed Care - HMO | Admitting: Certified Registered"

## 2015-02-11 ENCOUNTER — Inpatient Hospital Stay (HOSPITAL_COMMUNITY): Payer: Commercial Managed Care - HMO

## 2015-02-11 ENCOUNTER — Encounter (HOSPITAL_COMMUNITY): Payer: Self-pay | Admitting: *Deleted

## 2015-02-11 ENCOUNTER — Inpatient Hospital Stay (HOSPITAL_COMMUNITY)
Admission: RE | Admit: 2015-02-11 | Discharge: 2015-02-13 | DRG: 460 | Disposition: A | Discharge status: Skilled Nursing Facility | Payer: Commercial Managed Care - HMO | Source: Ambulatory Visit | Attending: Neurosurgery | Admitting: Neurosurgery

## 2015-02-11 DIAGNOSIS — M549 Dorsalgia, unspecified: Secondary | ICD-10-CM | POA: Diagnosis present

## 2015-02-11 DIAGNOSIS — I10 Essential (primary) hypertension: Secondary | ICD-10-CM | POA: Diagnosis present

## 2015-02-11 DIAGNOSIS — F419 Anxiety disorder, unspecified: Secondary | ICD-10-CM | POA: Diagnosis present

## 2015-02-11 DIAGNOSIS — Z791 Long term (current) use of non-steroidal anti-inflammatories (NSAID): Secondary | ICD-10-CM | POA: Diagnosis not present

## 2015-02-11 DIAGNOSIS — M51369 Other intervertebral disc degeneration, lumbar region without mention of lumbar back pain or lower extremity pain: Secondary | ICD-10-CM | POA: Diagnosis present

## 2015-02-11 DIAGNOSIS — M5136 Other intervertebral disc degeneration, lumbar region: Principal | ICD-10-CM | POA: Diagnosis present

## 2015-02-11 DIAGNOSIS — G8114 Spastic hemiplegia affecting left nondominant side: Secondary | ICD-10-CM | POA: Diagnosis present

## 2015-02-11 DIAGNOSIS — M5137 Other intervertebral disc degeneration, lumbosacral region: Secondary | ICD-10-CM | POA: Diagnosis present

## 2015-02-11 DIAGNOSIS — M51379 Other intervertebral disc degeneration, lumbosacral region without mention of lumbar back pain or lower extremity pain: Secondary | ICD-10-CM | POA: Diagnosis present

## 2015-02-11 DIAGNOSIS — K219 Gastro-esophageal reflux disease without esophagitis: Secondary | ICD-10-CM | POA: Diagnosis present

## 2015-02-11 DIAGNOSIS — R569 Unspecified convulsions: Secondary | ICD-10-CM | POA: Diagnosis present

## 2015-02-11 DIAGNOSIS — K589 Irritable bowel syndrome without diarrhea: Secondary | ICD-10-CM | POA: Diagnosis present

## 2015-02-11 DIAGNOSIS — S069X9A Unspecified intracranial injury with loss of consciousness of unspecified duration, initial encounter: Secondary | ICD-10-CM | POA: Diagnosis present

## 2015-02-11 DIAGNOSIS — M4806 Spinal stenosis, lumbar region: Secondary | ICD-10-CM | POA: Diagnosis present

## 2015-02-11 DIAGNOSIS — S069XAA Unspecified intracranial injury with loss of consciousness status unknown, initial encounter: Secondary | ICD-10-CM | POA: Diagnosis present

## 2015-02-11 DIAGNOSIS — M4326 Fusion of spine, lumbar region: Secondary | ICD-10-CM

## 2015-02-11 DIAGNOSIS — Z87820 Personal history of traumatic brain injury: Secondary | ICD-10-CM | POA: Diagnosis not present

## 2015-02-11 LAB — GLUCOSE, CAPILLARY
GLUCOSE-CAPILLARY: 131 mg/dL — AB (ref 70–99)
Glucose-Capillary: 218 mg/dL — ABNORMAL HIGH (ref 70–99)
Glucose-Capillary: 205 mg/dL — ABNORMAL HIGH (ref 70–99)

## 2015-02-11 SURGERY — POSTERIOR LUMBAR FUSION 1 LEVEL
Anesthesia: General | Site: Back

## 2015-02-11 MED ORDER — INSULIN ASPART 100 UNIT/ML ~~LOC~~ SOLN
0.0000 [IU] | Freq: Three times a day (TID) | SUBCUTANEOUS | Status: DC
  Administered 2015-02-12: 2 [IU] via SUBCUTANEOUS
  Administered 2015-02-12 (×2): 3 [IU] via SUBCUTANEOUS
  Administered 2015-02-13 (×3): 2 [IU] via SUBCUTANEOUS

## 2015-02-11 MED ORDER — PHENOL 1.4 % MT LIQD: 1.0000 | OROMUCOSAL | Status: DC | PRN

## 2015-02-11 MED ORDER — LEVETIRACETAM 500 MG PO TABS
500.0000 mg | ORAL_TABLET | Freq: Three times a day (TID) | ORAL | Status: DC
  Administered 2015-02-11 – 2015-02-13 (×7): 500 mg via ORAL
  Filled 2015-02-11 (×7): qty 1

## 2015-02-11 MED ORDER — ACETAMINOPHEN 325 MG PO TABS: 650.0000 mg | ORAL_TABLET | ORAL | Status: DC | PRN

## 2015-02-11 MED ORDER — ALISKIREN FUMARATE 150 MG PO TABS
300.0000 mg | ORAL_TABLET | Freq: Every day | ORAL | Status: DC
  Administered 2015-02-12 – 2015-02-13 (×2): 300 mg via ORAL
  Filled 2015-02-11 (×2): qty 2

## 2015-02-11 MED ORDER — GLYCOPYRROLATE 0.2 MG/ML IJ SOLN
INTRAMUSCULAR | Status: DC | PRN
  Administered 2015-02-11: 0.6 mg via INTRAVENOUS

## 2015-02-11 MED ORDER — FENTANYL CITRATE (PF) 100 MCG/2ML IJ SOLN
INTRAMUSCULAR | Status: DC | PRN
  Administered 2015-02-11 (×2): 100 ug via INTRAVENOUS
  Administered 2015-02-11: 50 ug via INTRAVENOUS

## 2015-02-11 MED ORDER — ROCURONIUM BROMIDE 100 MG/10ML IV SOLN
INTRAVENOUS | Status: DC | PRN
Start: 2015-02-11 — End: 2015-02-11
  Administered 2015-02-11 (×2): 10 mg via INTRAVENOUS
  Administered 2015-02-11: 40 mg via INTRAVENOUS

## 2015-02-11 MED ORDER — FENTANYL CITRATE (PF) 100 MCG/2ML IJ SOLN
INTRAMUSCULAR | Status: AC
  Administered 2015-02-11: 25 ug via INTRAVENOUS
  Filled 2015-02-11: qty 2

## 2015-02-11 MED ORDER — SUCCINYLCHOLINE CHLORIDE 20 MG/ML IJ SOLN
INTRAMUSCULAR | Status: AC
  Filled 2015-02-11: qty 1

## 2015-02-11 MED ORDER — VANCOMYCIN HCL 1000 MG IV SOLR
INTRAVENOUS | Status: AC
  Filled 2015-02-11: qty 1000

## 2015-02-11 MED ORDER — VANCOMYCIN HCL IN DEXTROSE 1-5 GM/200ML-% IV SOLN
1000.0000 mg | Freq: Once | INTRAVENOUS | Status: AC
  Administered 2015-02-11: 1000 mg via INTRAVENOUS
  Filled 2015-02-11: qty 200

## 2015-02-11 MED ORDER — CLONAZEPAM 0.5 MG PO TABS: 0.5000 mg | ORAL_TABLET | Freq: Three times a day (TID) | ORAL | Status: DC

## 2015-02-11 MED ORDER — LIDOCAINE HCL (CARDIAC) 20 MG/ML IV SOLN
INTRAVENOUS | Status: DC | PRN
  Administered 2015-02-11: 80 mg via INTRAVENOUS

## 2015-02-11 MED ORDER — SODIUM CHLORIDE 0.9 % IJ SOLN
3.0000 mL | Freq: Two times a day (BID) | INTRAMUSCULAR | Status: DC
  Administered 2015-02-11 – 2015-02-12 (×3): 3 mL via INTRAVENOUS

## 2015-02-11 MED ORDER — DIAZEPAM 5 MG PO TABS
5.0000 mg | ORAL_TABLET | Freq: Four times a day (QID) | ORAL | Status: DC | PRN
  Administered 2015-02-12: 5 mg via ORAL
  Filled 2015-02-11: qty 1

## 2015-02-11 MED ORDER — CLONAZEPAM 0.5 MG PO TABS
0.2500 mg | ORAL_TABLET | Freq: Two times a day (BID) | ORAL | Status: DC
  Administered 2015-02-11 – 2015-02-13 (×4): 0.25 mg via ORAL
  Filled 2015-02-11 (×5): qty 1

## 2015-02-11 MED ORDER — PANTOPRAZOLE SODIUM 40 MG PO TBEC
40.0000 mg | DELAYED_RELEASE_TABLET | Freq: Every day | ORAL | Status: DC
  Administered 2015-02-11 – 2015-02-13 (×3): 40 mg via ORAL
  Filled 2015-02-11 (×3): qty 1

## 2015-02-11 MED ORDER — ALISKIREN-HYDROCHLOROTHIAZIDE 300-12.5 MG PO TABS: 1.0000 | ORAL_TABLET | Freq: Every day | ORAL | Status: DC

## 2015-02-11 MED ORDER — FLUTICASONE PROPIONATE 50 MCG/ACT NA SUSP: 1.0000 | Freq: Every day | NASAL | Status: DC | PRN

## 2015-02-11 MED ORDER — FENTANYL CITRATE (PF) 100 MCG/2ML IJ SOLN
25.0000 ug | INTRAMUSCULAR | Status: DC | PRN
  Administered 2015-02-11 (×2): 25 ug via INTRAVENOUS

## 2015-02-11 MED ORDER — ALPRAZOLAM 0.5 MG PO TABS: 0.5000 mg | ORAL_TABLET | Freq: Three times a day (TID) | ORAL | Status: DC | PRN

## 2015-02-11 MED ORDER — PHENYLEPHRINE HCL 10 MG/ML IJ SOLN
INTRAMUSCULAR | Status: DC | PRN
  Administered 2015-02-11 (×2): 40 ug via INTRAVENOUS
  Administered 2015-02-11: 80 ug via INTRAVENOUS
  Administered 2015-02-11 (×2): 40 ug via INTRAVENOUS
  Administered 2015-02-11: 80 ug via INTRAVENOUS

## 2015-02-11 MED ORDER — MIDAZOLAM HCL 2 MG/2ML IJ SOLN
INTRAMUSCULAR | Status: AC
  Filled 2015-02-11: qty 2

## 2015-02-11 MED ORDER — MIDAZOLAM HCL 5 MG/5ML IJ SOLN
INTRAMUSCULAR | Status: DC | PRN
  Administered 2015-02-11: 2 mg via INTRAVENOUS

## 2015-02-11 MED ORDER — METOPROLOL SUCCINATE ER 25 MG PO TB24
50.0000 mg | ORAL_TABLET | Freq: Every day | ORAL | Status: DC
  Administered 2015-02-12: 50 mg via ORAL
  Filled 2015-02-11 (×2): qty 2

## 2015-02-11 MED ORDER — LIDOCAINE HCL (CARDIAC) 20 MG/ML IV SOLN
INTRAVENOUS | Status: AC
  Filled 2015-02-11: qty 5

## 2015-02-11 MED ORDER — ONDANSETRON HCL 4 MG/2ML IJ SOLN: 4.0000 mg | INTRAMUSCULAR | Status: DC | PRN

## 2015-02-11 MED ORDER — SUCCINYLCHOLINE CHLORIDE 20 MG/ML IJ SOLN
INTRAMUSCULAR | Status: DC | PRN
  Administered 2015-02-11: 100 mg via INTRAVENOUS

## 2015-02-11 MED ORDER — FENTANYL CITRATE (PF) 250 MCG/5ML IJ SOLN
INTRAMUSCULAR | Status: AC
  Filled 2015-02-11: qty 5

## 2015-02-11 MED ORDER — ROCURONIUM BROMIDE 50 MG/5ML IV SOLN
INTRAVENOUS | Status: AC
  Filled 2015-02-11: qty 1

## 2015-02-11 MED ORDER — PROPOFOL 10 MG/ML IV BOLUS
INTRAVENOUS | Status: AC
  Filled 2015-02-11: qty 20

## 2015-02-11 MED ORDER — HYDROCHLOROTHIAZIDE 12.5 MG PO CAPS
12.5000 mg | ORAL_CAPSULE | Freq: Every day | ORAL | Status: DC
  Administered 2015-02-12: 12.5 mg via ORAL
  Filled 2015-02-11 (×2): qty 1

## 2015-02-11 MED ORDER — HYDROCODONE-ACETAMINOPHEN 5-325 MG PO TABS
1.0000 | ORAL_TABLET | ORAL | Status: DC | PRN
  Administered 2015-02-11 – 2015-02-13 (×8): 2 via ORAL
  Filled 2015-02-11 (×9): qty 2

## 2015-02-11 MED ORDER — SODIUM CHLORIDE 0.9 % IR SOLN
Status: DC | PRN
  Administered 2015-02-11: 500 mL

## 2015-02-11 MED ORDER — VANCOMYCIN HCL 1000 MG IV SOLR
INTRAVENOUS | Status: DC | PRN
  Administered 2015-02-11: 1000 mg

## 2015-02-11 MED ORDER — SODIUM CHLORIDE 0.9 % IV SOLN: 250.0000 mL | INTRAVENOUS | Status: DC

## 2015-02-11 MED ORDER — NEOSTIGMINE METHYLSULFATE 10 MG/10ML IV SOLN
INTRAVENOUS | Status: DC | PRN
  Administered 2015-02-11: 4 mg via INTRAVENOUS

## 2015-02-11 MED ORDER — SODIUM CHLORIDE 0.9 % IJ SOLN
3.0000 mL | INTRAMUSCULAR | Status: DC | PRN
  Administered 2015-02-12: 3 mL via INTRAVENOUS
  Filled 2015-02-11: qty 3

## 2015-02-11 MED ORDER — THROMBIN 20000 UNITS EX SOLR
CUTANEOUS | Status: DC | PRN
  Administered 2015-02-11: 20 mL via TOPICAL

## 2015-02-11 MED ORDER — MENTHOL 3 MG MT LOZG: 1.0000 | LOZENGE | OROMUCOSAL | Status: DC | PRN

## 2015-02-11 MED ORDER — HYDROMORPHONE HCL 1 MG/ML IJ SOLN
0.5000 mg | INTRAMUSCULAR | Status: DC | PRN
  Administered 2015-02-11: 0.5 mg via INTRAVENOUS
  Filled 2015-02-11: qty 1

## 2015-02-11 MED ORDER — INSULIN ASPART 100 UNIT/ML ~~LOC~~ SOLN
0.0000 [IU] | Freq: Every day | SUBCUTANEOUS | Status: DC
  Administered 2015-02-11: 2 [IU] via SUBCUTANEOUS

## 2015-02-11 MED ORDER — ONDANSETRON HCL 4 MG/2ML IJ SOLN
INTRAMUSCULAR | Status: DC | PRN
  Administered 2015-02-11: 4 mg via INTRAVENOUS

## 2015-02-11 MED ORDER — BUPIVACAINE HCL (PF) 0.25 % IJ SOLN
INTRAMUSCULAR | Status: DC | PRN
  Administered 2015-02-11: 20 mL

## 2015-02-11 MED ORDER — ACETAMINOPHEN 650 MG RE SUPP: 650.0000 mg | RECTAL | Status: DC | PRN

## 2015-02-11 MED ORDER — CLONAZEPAM 0.5 MG PO TABS
0.5000 mg | ORAL_TABLET | Freq: Every day | ORAL | Status: DC
  Administered 2015-02-11 – 2015-02-12 (×2): 0.5 mg via ORAL
  Filled 2015-02-11 (×2): qty 1

## 2015-02-11 MED ORDER — PROPOFOL 10 MG/ML IV BOLUS
INTRAVENOUS | Status: DC | PRN
  Administered 2015-02-11: 150 mg via INTRAVENOUS

## 2015-02-11 MED ORDER — LACTATED RINGERS IV SOLN
INTRAVENOUS | Status: DC | PRN
  Administered 2015-02-11 (×2): via INTRAVENOUS

## 2015-02-11 MED ORDER — 0.9 % SODIUM CHLORIDE (POUR BTL) OPTIME
TOPICAL | Status: DC | PRN
  Administered 2015-02-11: 1000 mL

## 2015-02-11 MED FILL — Heparin Sodium (Porcine) Inj 1000 Unit/ML: INTRAMUSCULAR | Qty: 30 | Status: AC

## 2015-02-11 MED FILL — Sodium Chloride IV Soln 0.9%: INTRAVENOUS | Qty: 1000 | Status: AC

## 2015-02-11 SURGICAL SUPPLY — 61 items
BAG DECANTER FOR FLEXI CONT (MISCELLANEOUS) ×3 IMPLANT
BENZOIN TINCTURE PRP APPL 2/3 (GAUZE/BANDAGES/DRESSINGS) ×3 IMPLANT
BLADE CLIPPER SURG (BLADE) IMPLANT
BRUSH SCRUB EZ PLAIN DRY (MISCELLANEOUS) ×3 IMPLANT
BUR MATCHSTICK NEURO 3.0 LAGG (BURR) ×3 IMPLANT
CAGE 10X22X12 (Cage) ×6 IMPLANT
CANISTER SUCT 3000ML PPV (MISCELLANEOUS) ×3 IMPLANT
CAP LCK SPNE (Orthopedic Implant) ×2 IMPLANT
CAP LOCK SPINE RADIUS (Orthopedic Implant) ×2 IMPLANT
CAP LOCKING (Orthopedic Implant) ×4 IMPLANT
CAP LOCKING 90D50000LTS (MISCELLANEOUS) ×4 IMPLANT
CLOSURE WOUND 1/2 X4 (GAUZE/BANDAGES/DRESSINGS) ×2
CONT SPEC 4OZ CLIKSEAL STRL BL (MISCELLANEOUS) ×6 IMPLANT
COVER BACK TABLE 60X90IN (DRAPES) ×3 IMPLANT
DECANTER SPIKE VIAL GLASS SM (MISCELLANEOUS) ×3 IMPLANT
DRAPE C-ARM 42X72 X-RAY (DRAPES) ×6 IMPLANT
DRAPE LAPAROTOMY 100X72X124 (DRAPES) ×3 IMPLANT
DRAPE POUCH INSTRU U-SHP 10X18 (DRAPES) ×3 IMPLANT
DRAPE PROXIMA HALF (DRAPES) IMPLANT
DRAPE SURG 17X23 STRL (DRAPES) ×12 IMPLANT
DRSG OPSITE POSTOP 4X6 (GAUZE/BANDAGES/DRESSINGS) ×3 IMPLANT
DURAPREP 26ML APPLICATOR (WOUND CARE) ×3 IMPLANT
ELECT REM PT RETURN 9FT ADLT (ELECTROSURGICAL) ×3
ELECTRODE REM PT RTRN 9FT ADLT (ELECTROSURGICAL) ×1 IMPLANT
EVACUATOR 1/8 PVC DRAIN (DRAIN) ×3 IMPLANT
GAUZE SPONGE 4X4 12PLY STRL (GAUZE/BANDAGES/DRESSINGS) ×3 IMPLANT
GAUZE SPONGE 4X4 16PLY XRAY LF (GAUZE/BANDAGES/DRESSINGS) IMPLANT
GLOVE ECLIPSE 9.0 STRL (GLOVE) ×6 IMPLANT
GLOVE EXAM NITRILE LRG STRL (GLOVE) IMPLANT
GLOVE EXAM NITRILE MD LF STRL (GLOVE) IMPLANT
GLOVE EXAM NITRILE XL STR (GLOVE) IMPLANT
GLOVE EXAM NITRILE XS STR PU (GLOVE) IMPLANT
GOWN STRL REUS W/ TWL LRG LVL3 (GOWN DISPOSABLE) IMPLANT
GOWN STRL REUS W/ TWL XL LVL3 (GOWN DISPOSABLE) ×2 IMPLANT
GOWN STRL REUS W/TWL 2XL LVL3 (GOWN DISPOSABLE) IMPLANT
GOWN STRL REUS W/TWL LRG LVL3 (GOWN DISPOSABLE)
GOWN STRL REUS W/TWL XL LVL3 (GOWN DISPOSABLE) ×4
KIT BASIN OR (CUSTOM PROCEDURE TRAY) ×3 IMPLANT
KIT ROOM TURNOVER OR (KITS) ×3 IMPLANT
LIQUID BAND (GAUZE/BANDAGES/DRESSINGS) ×3 IMPLANT
Locking Caps-90D ×6 IMPLANT
NEEDLE HYPO 22GX1.5 SAFETY (NEEDLE) ×3 IMPLANT
NS IRRIG 1000ML POUR BTL (IV SOLUTION) ×3 IMPLANT
PACK LAMINECTOMY NEURO (CUSTOM PROCEDURE TRAY) ×3 IMPLANT
ROD 50MM (Rod) ×2 IMPLANT
ROD RADIUS 45MM (Rod) ×2 IMPLANT
ROD SPNL 45X5.5XNS TI RDS (Rod) ×1 IMPLANT
ROD SPNL 50X5.5XNS TI RDS (Rod) ×1 IMPLANT
SCREW 5.75X40M (Screw) ×6 IMPLANT
SPONGE SURGIFOAM ABS GEL 100 (HEMOSTASIS) ×3 IMPLANT
STRIP CLOSURE SKIN 1/2X4 (GAUZE/BANDAGES/DRESSINGS) ×4 IMPLANT
SUT VIC AB 0 CT1 18XCR BRD8 (SUTURE) ×2 IMPLANT
SUT VIC AB 0 CT1 8-18 (SUTURE) ×4
SUT VIC AB 2-0 CT1 18 (SUTURE) ×3 IMPLANT
SUT VIC AB 3-0 SH 8-18 (SUTURE) ×6 IMPLANT
SYR 20ML ECCENTRIC (SYRINGE) ×3 IMPLANT
TAPE STRIPS DRAPE STRL (GAUZE/BANDAGES/DRESSINGS) ×3 IMPLANT
TOWEL OR 17X24 6PK STRL BLUE (TOWEL DISPOSABLE) ×3 IMPLANT
TOWEL OR 17X26 10 PK STRL BLUE (TOWEL DISPOSABLE) ×3 IMPLANT
TRAY FOLEY CATH 14FRSI W/METER (CATHETERS) ×3 IMPLANT
WATER STERILE IRR 1000ML POUR (IV SOLUTION) ×3 IMPLANT
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
## 2015-02-11 NOTE — Progress Notes (Signed)
CARE MANAGEMENT NOTE 02/11/2015  Patient:  Kendra [name], Kendra [name]   Account Number:  [id number]  Date Initiated:  02/11/2015  Documentation initiated by:  Lorne Skeens  Subjective/Objective Assessment:   Patient was admitted for a lumbar decompression and fusion.     Action/Plan:   Will follow for discharge needs pending PT/OT evals and physician orders.   Anticipated DC Date:     Anticipated DC Plan:           Choice offered to / List presented to:             Status of service:   Medicare Important Message given?   (If response is "NO", the following Medicare IM given date fields will be blank) Date Medicare IM given:   Medicare IM given by:   Date Additional Medicare IM given:   Additional Medicare IM given by:    Discharge Disposition:    Per UR Regulation:  Reviewed for med. necessity/level of care/duration of stay  If discussed at [place] of Stay Meetings, dates discussed:    Comments:
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
## 2015-02-11 NOTE — Progress Notes (Signed)
Pt admitted to 4N16 from PACU at 1345. Report received from [facility], PACU RN. Post L2-3 PLIF. Pt oriented to room and bed alarms intact. Family members at bedside.

## 2015-02-11 NOTE — Anesthesia Procedure Notes (Signed)
Procedure Name: Intubation Date/Time: 02/11/2015 7:56 AM Performed by: Maeola Harman Pre-anesthesia Checklist: Patient identified, Emergency Drugs available, Suction available, Patient being monitored and Timeout performed Patient Re-evaluated:Patient Re-evaluated prior to inductionOxygen Delivery Method: Circle system utilized Preoxygenation: Pre-oxygenation with 100% oxygen Intubation Type: IV induction Ventilation: Mask ventilation without difficulty Laryngoscope Size: Mac and 3 Grade View: Grade I Tube size: 7.5 mm Number of attempts: 1 Airway Equipment and Method: Stylet Placement Confirmation: ETT inserted through vocal cords under direct vision,  positive ETCO2 and breath sounds checked- equal and bilateral Secured at: 22 cm Tube secured with: Tape Dental Injury: Teeth and Oropharynx as per pre-operative assessment

## 2015-02-11 NOTE — Anesthesia Preprocedure Evaluation (Addendum)
Anesthesia Evaluation  Patient identified by MRN, date of birth, ID band Patient awake    Reviewed: Allergy & Precautions, NPO status , Patient's Chart, lab work & pertinent test results, reviewed documented beta blocker date and time   Airway Mallampati: II  TM Distance: >3 FB Neck ROM: Full    Dental   Pulmonary  breath sounds clear to auscultation        Cardiovascular hypertension, Pt. on medications Rhythm:Regular Rate:Normal     Neuro/Psych  Headaches, Seizures -, Well Controlled,  Anxiety  Neuromuscular disease    GI/Hepatic Neg liver ROS, GERD-  ,  Endo/Other  negative endocrine ROS  Renal/GU negative Renal ROS     Musculoskeletal  (+) Arthritis -,   Abdominal   Peds  Hematology   Anesthesia Other Findings   Reproductive/Obstetrics                            Anesthesia Physical Anesthesia Plan  ASA: III  Anesthesia Plan: General   Post-op Pain Management:    Induction: Intravenous  Airway Management Planned: Oral ETT  Additional Equipment:   Intra-op Plan:   Post-operative Plan: Extubation in OR  Informed Consent: I have reviewed the patients History and Physical, chart, labs and discussed the procedure including the risks, benefits and alternatives for the proposed anesthesia with the patient or authorized representative who has indicated his/her understanding and acceptance.   Dental advisory given  Plan Discussed with: CRNA, Anesthesiologist and Surgeon  Anesthesia Plan Comments:         Anesthesia Quick Evaluation

## 2015-02-11 NOTE — Brief Op Note (Signed)
02/11/2015  10:02 AM  PATIENT:  Bradly Chris Bonillas  54 y.o. female  PRE-OPERATIVE DIAGNOSIS:  Stenosis  POST-OPERATIVE DIAGNOSIS:  Stenosis  PROCEDURE:  Procedure(s) with comments: POSTERIOR LUMBAR FUSION 1 LEVEL (N/A) - POSTERIOR LUMBAR FUSION 1 LEVEL LUMBAR 2-3  SURGEON:  Surgeon(s) and Role:    * Earnie Larsson, MD - Primary    * Consuella Lose, MD - Assisting  PHYSICIAN ASSISTANT:   ASSISTANTS:    ANESTHESIA:   general  EBL:  Total I/O In: 1400 [I.V.:1400] Out: 300 [Urine:200; Blood:100]  BLOOD ADMINISTERED:none  DRAINS: none   LOCAL MEDICATIONS USED:  MARCAINE     SPECIMEN:  No Specimen  DISPOSITION OF SPECIMEN:  N/A  COUNTS:  YES  TOURNIQUET:  * No tourniquets in log *  DICTATION: .Dragon Dictation  PLAN OF CARE: Admit to inpatient   PATIENT DISPOSITION:  PACU - hemodynamically stable.   Delay start of Pharmacological VTE agent (>24hrs) due to surgical blood loss or risk of bleeding: yes

## 2015-02-11 NOTE — Transfer of Care (Signed)
Immediate Anesthesia Transfer of Care Note  Patient: Kendra [name]  Procedure(s) Performed: Procedure(s) with comments: POSTERIOR LUMBAR FUSION 1 LEVEL (N/A) - POSTERIOR LUMBAR FUSION 1 LEVEL LUMBAR 2-3  Patient Location: PACU  Anesthesia Type:General  Level of Consciousness: awake, alert  and sedated  Airway & Oxygen Therapy: Patient connected to face mask oxygen  Post-op Assessment: Report given to RN  Post vital signs: stable  Last Vitals:  Filed Vitals:   02/11/15 0646  BP: 114/75  Pulse: 68  Temp: 36.7 C  Resp: 18    Complications: No apparent anesthesia complications

## 2015-02-11 NOTE — Anesthesia Postprocedure Evaluation (Signed)
  Anesthesia Post-op Note  Patient: Kendra [name]  Procedure(s) Performed: Procedure(s) with comments: POSTERIOR LUMBAR FUSION 1 LEVEL (N/A) - POSTERIOR LUMBAR FUSION 1 LEVEL LUMBAR 2-3  Patient Location: PACU  Anesthesia Type:General  Level of Consciousness: awake  Airway and Oxygen Therapy: Patient Spontanous Breathing  Post-op Pain: mild  Post-op Assessment: Post-op Vital signs reviewed  Post-op Vital Signs: Reviewed  Last Vitals:  Filed Vitals:   02/11/15 1356  BP: 104/61  Pulse: 81  Temp: 36.7 C  Resp: 20    Complications: No apparent anesthesia complications

## 2015-02-11 NOTE — Op Note (Signed)
Date of procedure: 02/11/2015  Date of dictation: Same  Service: Neurosurgery  Preoperative diagnosis: L2-3 degenerative disc disease/adjacent level degeneration with L2-3 foraminal stenosis, status post L3-S1 decompression and fusion  Postoperative diagnosis: Same  Procedure Name: L2-3 decompressive laminectomy with bilateral L2 and L3 redo decompressive foraminotomies, more than would be required for simple interbody fusion alone.  L2-3 posterior lumbar interbody fusion utilizing interbody peek cages and locally harvested autograft  L2-3 posterior lateral arthrodesis utilizing nonsegmental pedicle screw instrumentation and local autograft  Reexploration of L3-L5 posterior lateral arthrodesis with removal of hardware  Surgeon:Darrick Greenlaw A.Zi Newbury, M.D.  Asst. Surgeon: Kathyrn Sheriff   Anesthesia: General  Indication: The patient is a 54 year old female with a history of a prior traumatic brain injury with resultant left-sided spastic, [place] is. She is status post previous L3-S1 decompression infusion. She presents now with worsening back and lower extremity pain. Workup demonstrates evidence of marked disc degeneration with disc space collapse and lateral listhesis at L2-3 with marked resultant neural foraminal stenosis. Patient presents now for L2-3 decompression infusion.  Operative note: After induction of anesthesia, patient position prone onto Wilson frame and a properly padded. Lumbar region prepped and draped sterilely. Incision made overlying L2-3. Dissection performed bilaterally. Retractor placed area and previous instrumentation from L3-L5 was exposed. Rods were cut above the L5 pedicle screws. L3 pedicle screws were disassembled and the rod and transverse connector was removed. Pedicle screws at L3 were inspected and found to be solidly within the bone. Decompressive laminectomies and performed using Leksell rongeurs Kerrison rongeurs the high-speed drill to remove the entire lamina of L2  the entire inferior facet of L2 bilaterally. The majority the superior facet of L3 bilaterally. Ligament flavum and epidural scar were elevated and resected. Underlying thecal sac and a and L2 and L3 nerve roots were identified. Decompressive foraminotomies were performed on course exiting L2 and L3 nerve roots in a excess of what would be needed for simple interbody fusion alone. Epidural venous plexus was quite related and cut. Bilateral discectomies were performed at L2-3. Disc space was distracted up to 10 mm and a 10 mm distractor was left in the patient's left side. Thecal sac and nerve roots were protected on the patient's right side. Disc spaces and reamed and then scraped. After all soft tissue was removed a 10 mm x 22 mm x 12 Medtronic capstone cage packed with locally harvested autograft was impacted into place and rotated into final position. Distractor was removed patient's left side. Thecal sac and nerve roots protected on the left side. Disc space once again prepared. Soft tissue was removed and interspace. Morselize autograft was then packed in the interspace for later fusion. A second 10 mm cage was impacted into place and rotated into final position. Pedicles of L2 were then applied using surface landmarks and intraoperative fluoroscopy. Sufficient bone overlying the pedicle was removed using high-speed drill. Each pedicles and probed using a pedicle awl each pedicle awl track was then probed and found to be solidly within the bone. 5.25 mm screw taps were then used at each hole. Each screw tap hole was probed and found to be solidly within the pedicle. 5.75 x 40 mm Stryker medical radius brand screws were placed bilaterally at L2. Transverse processes of L2 and the residual superior facet of L3 were decorticated. Morselize autograft was packed posterior laterally for later fusion. Short segment titanium rods and placed over the screw heads from L2-L3. Locking caps were then engaged. Locking caps  were then given  a final tightening with the construct under slight compression. Final images revealed good position of the bone graft and hardware at proper upper level with normal alignment is fine. Wound is irrigated one final time. Vancomycin powder was placed in the deep wound space. Gelfoam was placed over the laminotomy defect. Wounds and close in layers with Vicryl sutures. Steri-Strips and sterile dressing were applied. No apparent complications. The patient tolerated procedure well and she returns to the recovery room postop.

## 2015-02-11 NOTE — Progress Notes (Signed)
Utilization review completed.  

## 2015-02-12 LAB — GLUCOSE, CAPILLARY
GLUCOSE-CAPILLARY: 132 mg/dL — AB (ref 70–99)
GLUCOSE-CAPILLARY: 132 mg/dL — AB (ref 70–99)
Glucose-Capillary: 157 mg/dL — ABNORMAL HIGH (ref 70–99)
Glucose-Capillary: 161 mg/dL — ABNORMAL HIGH (ref 70–99)

## 2015-02-12 NOTE — Clinical Social Work Note (Signed)
Clinical Social Work Assessment  Patient Details  Name: Kendra [name] MRN: [id number] Date of Birth: 01-14-1961  Date of referral:  02/12/15               Reason for consult:  Facility Placement                Permission sought to share information with:  Case Manager, Customer service manager, Family Supports  Housing/Transportation Living arrangements for the past 2 months:  [place] of Information:  Other (Comment Required) (CSW met with pt's sister, Kendra [name]. ) Patient Interpreter Needed:  None Criminal Activity/Legal Involvement Pertinent to Current Situation/Hospitalization:  No - Comment as needed Significant Relationships:  Other Family Members Lives with:  Siblings Do you feel safe going back to the place where you live?  Yes Need for family participation in patient care:  Yes (Comment)  Care giving concerns:  Patient and pt's family did not express care giving concerns.    Social Worker assessment / plan:  Holiday representative met with pt's sister outside of pt's room due to pt having procedure. CSW introduced CSW role and SNF process. CSW reviewed and provided SNF list. Pt's sister reported pt has never been in a SNF before however would like placement at [facility] and prefers [facility] as a second option. No further concerns reported by pt or family. CSW will continue to follow pt and pt's family for continued support and to facilitate pt's discharge needs once medically stable.    Employment status:  Other (Comment) Unknown Insurance information:  Managed Medicare PT Recommendations:  [place], Inpatient Rehab Consult Information / Referral to community resources:  [facility]  Patient/Family's Response to care:  Pt and pt's family agreeable to SNF placement.  Pt's sister pleasant and stated she just wants her sister to received the best care. CSW remains available and plans to meet with pt once stable.    Patient/Family's Understanding of and Emotional Response to Diagnosis, Current Treatment, and Prognosis:  Pt's sister expressed understanding of pt's procedure and is hopeful for a speedy recovery.   Emotional Assessment Appearance:  Well-Groomed Attitude/Demeanor/Rapport:  Unable to Assess Affect (typically observed):  Unable to Assess Orientation:  Oriented to Self, Oriented to Place, Oriented to  Time, Oriented to Situation Alcohol / Substance use:  Never Used Psych involvement (Current and /or in the community):  No (Comment)  Discharge Needs  Concerns to be addressed:  No discharge needs identified Readmission within the last 30 days:  No Current discharge risk:  None Barriers to Discharge:  No Barriers Identified   Glendon Axe, MSW, LCSWA [phone] 02/12/2015 12:19 PM

## 2015-02-12 NOTE — Progress Notes (Signed)
Rehab Admissions Coordinator Note:  Patient was screened by Cleatrice Burke for appropriateness for an Inpatient Acute Rehab Consult per OT recommendation. At this time, we are recommending await rehab consult completion. [employer] would have to approve any rehab venue.Cleatrice Burke 02/12/2015, 12:09 PM  I can be reached at [phone].

## 2015-02-12 NOTE — Progress Notes (Signed)
Postop day 1. Patient complains of back pain and some crampiness into her thighs bilaterally. No new complaints of numbness or weakness.  Afebrile. Vitals are stable. Urine output good. Awake and alert. Oriented and appropriate. Motor and sensory function stable. Dressing clean and dry. Abdomen soft.  Progressing well following lumbar decompression and fusion. Given patient's multiple other neuromuscular and medical needs I think she may benefit from inpatient rehabilitation prior to going home.

## 2015-02-12 NOTE — Evaluation (Addendum)
Occupational Therapy Evaluation Patient Details Name: Kendra [name] MRN: [id number] DOB: 27-Sep-1961 Today's Date: 02/12/2015    History of Present Illness 54 year old female with history of a childhood traumatic brain injury and resultant left sided hemiparesis with choreoathetoid movements. Patient status post previous L3-S1 decompression and fusion with good results. Patient presents now with intractable back pain. Workup demonstrates evidence of severe disc degeneration with instability at L2-3 with resultant foraminal collapse and foraminal stenosis. Pt now s/p POSTERIOR LUMBAR FUSION 1 LEVEL LUMBAR 2-3.   Clinical Impression   Pt s/p above. Pt independent with ADLs, PTA. Feel pt will benefit from acute OT to increase independence with BADLs prior to d/c. Recommending CIR for rehab.    Follow Up Recommendations  CIR    Equipment Recommendations  Other (comment) (defer to next venue)    Recommendations for Other Services       Precautions / Restrictions Precautions Precautions: Back;Fall Precaution Booklet Issued: No Precaution Comments: educated on back precautions Required Braces or Orthoses: Spinal Brace;Other Brace/Splint Spinal Brace: Lumbar corset;Applied in sitting position Other Brace/Splint: brace for LLE Restrictions Weight Bearing Restrictions: No      Mobility Bed Mobility Overal bed mobility: Needs Assistance Bed Mobility: Rolling;Sidelying to Sit Rolling: Min assist Sidelying to sit: Mod assist       General bed mobility comments: cues for technique. Assist with trunk to come to sitting position.  Transfers Overall transfer level: Needs assistance Equipment used: Quad cane Transfers: Sit to/from [facility] Sit to Stand: Min assist;Mod assist Stand pivot transfers: Mod assist       General transfer comment: Min-Mod assist for sit to stand from bed.    Balance  Mod assist for stand pivot to chair.                                           ADL Overall ADL's : Needs assistance/impaired Eating/Feeding: Set up;Sitting   Grooming: Set up;Supervision/safety;Sitting;Wash/dry face;Oral care (applied cream to face)       Lower Body Bathing: Maximal assistance;Sit to/from stand   Upper Body Dressing : Moderate assistance;Sitting;Maximal assistance   Lower Body Dressing: Maximal assistance;Sitting/lateral leans   Toilet Transfer: Moderate assistance;Stand-pivot (quad cane; from bed to chair)           Functional mobility during ADLs: Moderate assistance (stand pivot with quad cane) General ADL Comments: Assist to don shoes and brace on left foot. Educated on back brace and OT assisted in donning it. Educated on use of cup for oral care and discussed positioning of grooming items to avoid breaking precautions. Educated on AE. Attempted to take a couple steps with quad cane but was very difficult requring a lot fo assist. Discussed d/c rehab options. Discussed incorporating precautions into functional activities.      Vision     Perception     Praxis      Pertinent Vitals/Pain Pain Assessment: 0-10 Pain Score: 8  Pain Location: back and legs Pain Descriptors / Indicators: Burning;Stabbing;Throbbing;Sore Pain Intervention(s): Monitored during session;Repositioned;RN gave pain meds during session     Hand Dominance     Extremity/Trunk Assessment Upper Extremity Assessment Upper Extremity Assessment: LUE deficits/detail LUE Deficits / Details: pt with history of TBI with resultant left sided hemiparesis with choreoathetoid movements. LUE Coordination: decreased fine motor;decreased gross motor   Lower Extremity Assessment Lower Extremity Assessment: Defer to PT evaluation  Communication Communication Communication: Expressive difficulties   Cognition Arousal/Alertness: Awake/alert Behavior During Therapy: WFL for tasks assessed/performed Overall Cognitive Status: History  of cognitive impairments - at baseline                     General Comments       Exercises       Shoulder Instructions      Home Living Family/patient expects to be discharged to:: Private residence Living Arrangements: Alone Available Help at Discharge: Family;Available PRN/intermittently Type of Home: House Home Access: Ramped entrance     Home Layout: One level     Bathroom Shower/Tub: Occupational psychologist: Handicapped height     Home Equipment: Grab bars - tub/shower;Bedside commode;Shower seat - built in;Cane - quad;Wheelchair - Oncologist;Other (Comment) (long brush)        Prior Functioning/Environment Level of Independence: Needs assistance    ADL's / Homemaking Assistance Needed: pt able to dress/bathe herself; family would bring in food for pt        OT Diagnosis: Acute pain   OT Problem List: Decreased coordination;Impaired balance (sitting and/or standing);Decreased strength;Decreased range of motion;Decreased knowledge of use of DME or AE;Decreased knowledge of precautions;Pain;Impaired UE functional use;Decreased cognition   OT Treatment/Interventions: Self-care/ADL training;DME and/or AE instruction;Therapeutic exercise;Therapeutic activities;Cognitive remediation/compensation;Patient/family education;Balance training    OT Goals(Current goals can be found in the care plan section) Acute Rehab OT Goals Patient Stated Goal: not stated OT Goal Formulation: With patient Time For Goal Achievement: 02/19/15 Potential to Achieve Goals: Good ADL Goals Pt Will Perform Lower Body Dressing: sitting/lateral leans;with adaptive equipment;with mod assist Pt Will Transfer to Toilet: stand pivot transfer;with min guard assist;bedside commode Pt Will Perform Toileting - Clothing Manipulation and hygiene: with set-up;with supervision;sitting/lateral leans Additional ADL Goal #1: Pt will independently  verbalize and demonstrate 3/3 back precautions. Additional ADL Goal #2: Pt will don/doff back brace with Min assist.  OT Frequency: Min 2X/week   Barriers to D/C:            Co-evaluation              End of Session Equipment Utilized During Treatment: Gait belt;Other (comment);Back brace (quad cane); brace for LLE Nurse Communication: Other (comment);Mobility status (pt up in chair-no chair alarm set)  Activity Tolerance: Patient tolerated treatment well;Patient limited by pain Patient left: in chair;with call bell/phone within reach   Time: 0923-0954 OT Time Calculation (min): 31 min Charges:  OT General Charges $OT Visit: 1 Procedure OT Evaluation $Initial OT Evaluation Tier I: 1 Procedure OT Treatments $Self Care/Home Management : 8-22 mins G-CodesBenito Mccreedy OTR/L [phone] 02/12/2015, 10:25 AM

## 2015-02-12 NOTE — Progress Notes (Signed)
Family is working with case management, would like skilled nursing facility rather than CIR. We'll defer consult.

## 2015-02-12 NOTE — Evaluation (Signed)
Physical Therapy Evaluation Patient Details Name: Kendra [name] MRN: [id number] DOB: 11-Jan-1961 Today's Date: 02/12/2015   History of Present Illness  54 year old female with history of a childhood traumatic brain injury and resultant left sided hemiparesis with choreoathetoid movements. Pt now s/p POSTERIOR LUMBAR FUSION 1 LEVEL LUMBAR 2-3.  Clinical Impression  Pt very motivated to work on mobility and improve independence.  Pt does have family support, but indicates that family all work during the day.  Feel pt would benefit from continued therapies to improve safety and pt wishes to continue rehab at [facility] in [place].  Will continue to follow while on acute.      Follow Up Recommendations SNF    Equipment Recommendations  None recommended by PT    Recommendations for Other Services       Precautions / Restrictions Precautions Precautions: Back;Fall Precaution Booklet Issued: No Precaution Comments: educated on back precautions Required Braces or Orthoses: Spinal Brace;Other Brace/Splint Spinal Brace: Lumbar corset;Applied in sitting position Other Brace/Splint: L AFO built into shoe Restrictions Weight Bearing Restrictions: No      Mobility  Bed Mobility               General bed mobility comments: pt sitting in recliner  Transfers Overall transfer level: Needs assistance Equipment used: Quad cane Transfers: Sit to/from Stand Sit to Stand: Mod assist         General transfer comment: pt able to A with coming to stand, but needs A for power up to standing and for balance.  cues to get closer to chair prior to coming to sitting.    Ambulation/Gait Ambulation/Gait assistance: Min assist;+2 physical assistance;+2 safety/equipment Ambulation Distance (Feet): 10 Feet Assistive device: Quad cane Gait Pattern/deviations: Step-through pattern;Decreased stride length;Decreased dorsiflexion - left;Decreased weight shift to left;Scissoring     General Gait Details:  pt with occasional scissor step and decreased coordination with movements of L LE.    Stairs            Wheelchair Mobility    Modified Rankin (Stroke Patients Only)       Balance Overall balance assessment: Needs assistance Sitting-balance support: No upper extremity supported;Feet supported Sitting balance-Leahy Scale: Fair     Standing balance support: Single extremity supported;During functional activity Standing balance-Leahy Scale: Poor                               Pertinent Vitals/Pain Pain Assessment: 0-10 Pain Score: 5  Pain Location: Back and legs Pain Descriptors / Indicators: Throbbing;Sore Pain Intervention(s): Monitored during session;Repositioned;Patient requesting pain meds-RN notified    Home Living Family/patient expects to be discharged to:: Private residence Living Arrangements: Alone Available Help at Discharge: Family;Available PRN/intermittently Type of Home: House Home Access: Ramped entrance     Home Layout: One level Home Equipment: Grab bars - tub/shower;Bedside commode;Shower seat - built in;Cane - quad;Wheelchair - Psychologist, educational      Prior Function Level of Independence: Needs assistance   Gait / Transfers Assistance Needed: pt used [employer] for very short distance ambulation, otherwise used Theatre manager.  ADL's / Homemaking Assistance Needed: pt able to dress/bathe herself; family would bring in food for pt and perform house chores.         Hand Dominance        Extremity/Trunk Assessment   Upper Extremity Assessment: Defer to OT evaluation           Lower Extremity Assessment:  LLE deficits/detail   LLE Deficits / Details: Overall wekaness and decreased coordination.       Communication   Communication: Expressive difficulties  Cognition Arousal/Alertness: Awake/alert Behavior During Therapy: WFL for tasks assessed/performed Overall Cognitive Status: History of cognitive impairments - at  baseline                      General Comments      Exercises        Assessment/Plan    PT Assessment Patient needs continued PT services  PT Diagnosis Difficulty walking   PT Problem List Decreased strength;Decreased activity tolerance;Decreased balance;Decreased mobility;Decreased coordination;Decreased knowledge of use of DME;Decreased knowledge of precautions;Pain  PT Treatment Interventions DME instruction;Gait training;Functional mobility training;Therapeutic activities;Therapeutic exercise;Balance training;Neuromuscular re-education;Patient/family education   PT Goals (Current goals can be found in the Care Plan section) Acute Rehab PT Goals Patient Stated Goal: not stated PT Goal Formulation: With patient Time For Goal Achievement: 02/19/15 Potential to Achieve Goals: Good    Frequency Min 5X/week   Barriers to discharge        Co-evaluation               End of Session Equipment Utilized During Treatment: Gait belt Activity Tolerance: Patient tolerated treatment well Patient left: in chair;with call bell/phone within reach;with chair alarm set Nurse Communication: Mobility status         Time: 1219-7588 PT Time Calculation (min) (ACUTE ONLY): 28 min   Charges:   PT Evaluation $Initial PT Evaluation Tier I: 1 Procedure PT Treatments $Gait Training: 8-22 mins   PT G CodesCatarina Hartshorn, [place] 02/12/2015, 3:00 PM

## 2015-02-12 NOTE — Progress Notes (Signed)
CARE MANAGEMENT NOTE 02/12/2015  Patient:  LEILANNY, FLUITT   Account Number:  [id number]  Date Initiated:  02/11/2015  Documentation initiated by:  Lorne Skeens  Subjective/Objective Assessment:   Patient was admitted for a lumbar decompression and fusion.     Action/Plan:   Will follow for discharge needs pending PT/OT evals and physician orders.   Anticipated DC Date:     Anticipated DC Plan:  SKILLED NURSING FACILITY  In-house referral  Clinical Social Worker         Choice offered to / List presented to:             Status of service:  In process, will continue to follow Medicare Important Message given?   (If response is "NO", the following Medicare IM given date fields will be blank) Date Medicare IM given:   Medicare IM given by:   Date Additional Medicare IM given:   Additional Medicare IM given by:    Discharge Disposition:    Per UR Regulation:  Reviewed for med. necessity/level of care/duration of stay  If discussed at [place] of Stay Meetings, dates discussed:    Comments:  02/12/15 [place], MSN, CM- Met with patient's sister [name] at bedside to discuss discharge planning.  Family states they are not interested in [facility], as they feel that SNF rehab would be more appropriate.  Family would prefer a SNF in [place] so they can visit frequesntly.  Sister [name] can be contacted at [phone].  CSW updated on plan.

## 2015-02-13 LAB — GLUCOSE, CAPILLARY
GLUCOSE-CAPILLARY: 121 mg/dL — AB (ref 70–99)
Glucose-Capillary: 126 mg/dL — ABNORMAL HIGH (ref 70–99)
Glucose-Capillary: 145 mg/dL — ABNORMAL HIGH (ref 70–99)

## 2015-02-13 NOTE — Progress Notes (Signed)
Postop day 2. Patient progressing well. Back pain well controlled. No new lower extremity symptoms.  Awake and alert. Afebrile. Vital stable. Motor and sensory exam stable. Abdomen soft. Chest clear.  Doing well following L2-3 decompression infusion. Continue efforts at mobilization. Patient ready for skilled nursing facility placement when bed available.

## 2015-02-13 NOTE — Discharge Summary (Signed)
Physician Discharge Summary  Patient ID: Kendra [name] MRN: [id number] DOB/AGE: 04-19-1961 54 y.o.  Admit date: 02/11/2015 Discharge date: 02/13/2015  Admission Diagnoses:  Discharge Diagnoses:  Principal Problem:   Degeneration of lumbar or lumbosacral intervertebral disc Active Problems:   Traumatic brain injury   Left spastic hemiparesis   Degenerative disc disease, lumbar   Discharged Condition: good  Hospital Course: The patient was admitted to the hospital where she underwent an uncomplicated L2 L3 decompression and fusion.  Postoperatively she has done quite well.  Preoperative back and lower extremity pain improved.  Patient mobilizing with therapy.  Ready for discharge to a skilled nursing facility for further convalescence.  Consults:   Significant Diagnostic Studies:   Treatments:   Discharge Exam: Blood pressure 105/61, pulse 109, temperature 99.2 F (37.3 C), temperature source Oral, resp. rate 18, height 5\' 4"  (1.626 m), weight 90.946 kg (200 lb 8 oz), SpO2 98 %. Awake and alert.  Oriented and appropriate.  Motor examination with a stable spastic left hemiparesis secondary to previous brain injury.  Wound clean and dry.  Chest and abdomen benign.  Disposition:      Medication List    TAKE these medications        ALPRAZolam 0.5 MG tablet  Commonly known as:  XANAX  Take 0.5 mg by mouth every 8 (eight) hours as needed.     BOTOX IM  Inject 1 Dose into the muscle every 3 (three) months.     clonazePAM 1 MG tablet  Commonly known as:  KLONOPIN  Take 0.5-1 tablets (0.5-1 mg total) by mouth 3 (three) times daily.     diclofenac sodium 1 % Gel  Commonly known as:  VOLTAREN  Apply topically 4 (four) times daily. Apply to shoulders, back and knees     fluticasone 50 MCG/ACT nasal spray  Commonly known as:  FLONASE  Place 1 spray into both nostrils daily as needed for allergies.     HYDROcodone-acetaminophen 5-325 MG per tablet  Commonly known as:   NORCO/VICODIN  Take 0.5-1 tablets by mouth every 6 (six) hours as needed for moderate pain.     levETIRAcetam 500 MG tablet  Commonly known as:  KEPPRA  Take 500 mg by mouth 3 (three) times daily.     KEPPRA 500 MG tablet  Generic drug:  levETIRAcetam  TAKE 1 TABLET BY MOUTH 3 TIMES A DAY     meloxicam 15 MG tablet  Commonly known as:  MOBIC  TAKE 1 TABLET EVERY DAY WITH FOOD     meloxicam 15 MG tablet  Commonly known as:  MOBIC  TAKE 1 TABLET EVERY DAY WITH FOOD     metoprolol succinate 50 MG 24 hr tablet  Commonly known as:  TOPROL-XL  TAKE 1 TABLET BY MOUTH EVERY DAY IMMEDIATELY FOLLOWING A MEAL     omeprazole 20 MG capsule  Commonly known as:  PRILOSEC  Take 20 mg by mouth daily.     TEKTURNA HCT 300-12.5 MG Tabs  Generic drug:  [place]iren-Hydrochlorothiazide  Take 1 tablet by mouth daily.           Follow-up Information    Follow up with Charlie Pitter, MD.   Specialty:  Neurosurgery   Contact information:   [address] [place] [address] [phone]       Signed: Charlie Pitter 02/13/2015, 4:07 PM

## 2015-02-13 NOTE — Progress Notes (Signed)
Pt discharging at this time [facility] stretcher transport alert, verbal taking all personal belongings. Pt to be transported to [facility]. Pt stable, neuro intact with no noted distress. She denies pain or discomfort. Pt's sister was called and made aware. Report called in to nurse Lelon Frohlich at facility.

## 2015-02-13 NOTE — Progress Notes (Signed)
Received ok from [facility] that they were ready to receive pt. Non-emergency ambulance arranged on behalf of pt.  RN to call pt's sister when ambulance arrives.

## 2015-02-13 NOTE — Clinical Social Work Placement (Signed)
   CLINICAL SOCIAL WORK PLACEMENT  NOTE  Date:  02/13/2015  Patient Details  Name: Kendra [name] MRN: [id number] Date of Birth: [birth year]  Clinical Social Work is seeking post-discharge placement for this patient at the [place] level of care (*CSW will initial, date and re-position this form in  chart as items are completed):  Yes   Patient/family provided with [place] Work Department's list of facilities offering this level of care within the geographic area requested by the patient (or if unable, by the patient's family).  Yes   Patient/family informed of their freedom to choose among providers that offer the needed level of care, that participate in Medicare, Medicaid or managed care program needed by the patient, have an available bed and are willing to accept the patient.  Yes   Patient/family informed of [place]'s ownership interest in [facility] and [facility], as well as of the fact that they are under no obligation to receive care at these facilities.  PASRR submitted to EDS on 02/13/15     PASRR number received on 02/13/15     Existing PASRR number confirmed on       FL2 transmitted to all facilities in geographic area requested by pt/family on 02/13/15     FL2 transmitted to all facilities within larger geographic area on       Patient informed that his/her managed care company has contracts with or will negotiate with certain facilities, including the following:   (YES)     Yes   Patient/family informed of bed offers received.  Patient chooses bed at  ([facility])     Physician recommends and patient chooses bed at      Patient to be transferred to  ([facility]) on 02/13/15.  Patient to be transferred to facility by  Corey Harold )     Patient family notified on 02/13/15 of transfer.  Name of family member notified:   (Pt's sister, [name] )     PHYSICIAN Please prepare  priority discharge summary, including medications, Please sign FL2, Please prepare prescriptions     Additional Comment:    _______________________________________________ Rozell Searing, LCSW 02/13/2015, 4:01 PM

## 2015-02-13 NOTE — Clinical Social Work Note (Signed)
Patient has a SNF bed at [facility].   FL-2 on chart for MD signature. CSW remains available.    Glendon Axe, MSW, LCSWA [phone] 02/13/2015 1:52 PM

## 2015-02-13 NOTE — Clinical Social Work Note (Signed)
Clinical Social Worker facilitated patient discharge including contacting patient family and facility to confirm patient discharge plans.  Clinical information faxed to facility and family agreeable with plan.  CSW arranged ambulance transport via [facility] to [facility].  RN to call report prior to discharge.  DC packet prepared and on chart for transport.   Clinical Social Worker will sign off for now as social work intervention is no longer needed. Please consult [place] again if new need arises.  Glendon Axe, MSW, LCSWA [phone] 02/13/2015 4:31 PM

## 2015-02-13 NOTE — Progress Notes (Signed)
Physical Therapy Treatment Patient Details Name: Kendra [name] MRN: [id number] DOB: May 03, 1961 Today's Date: 02/13/2015    History of Present Illness 54 year old female with history of a childhood traumatic brain injury and resultant left sided hemiparesis with choreoathetoid movements. Pt now s/p POSTERIOR LUMBAR FUSION 1 LEVEL LUMBAR 2-3.    PT Comments    Pt c/o pain today and overall fatigue.  Pt with low BP earlier today and monitored throughout session.  BP supine 113/64, sitting 116/64, and after completing SPT 112/60.  Continue to feel pt would benefit from SNF for further rehab prior to returning to home.  Will continue to follow.    Follow Up Recommendations  SNF     Equipment Recommendations  None recommended by PT    Recommendations for Other Services       Precautions / Restrictions Precautions Precautions: Back;Fall Precaution Booklet Issued: No Precaution Comments: educated on back precautions Required Braces or Orthoses: Spinal Brace;Other Brace/Splint Spinal Brace: Lumbar corset;Applied in sitting position Other Brace/Splint: L AFO built into shoe Restrictions Weight Bearing Restrictions: No    Mobility  Bed Mobility Overal bed mobility: Needs Assistance Bed Mobility: Rolling;Sidelying to Sit Rolling: Min assist Sidelying to sit: Mod assist       General bed mobility comments: pt follows cueing well for log roll and back precautions.    Transfers Overall transfer level: Needs assistance Equipment used: Quad cane Transfers: Sit to/from [facility] Sit to Stand: Mod assist Stand pivot transfers: Mod assist       General transfer comment: cues to complete pivot to chair prior to coming to sitting.  pt demos good use of UE.    Ambulation/Gait                 Stairs            Wheelchair Mobility    Modified Rankin (Stroke Patients Only)       Balance Overall balance assessment: Needs  assistance Sitting-balance support: No upper extremity supported;Feet supported Sitting balance-Leahy Scale: Fair     Standing balance support: Single extremity supported;During functional activity Standing balance-Leahy Scale: Poor                      Cognition Arousal/Alertness: Awake/alert Behavior During Therapy: WFL for tasks assessed/performed Overall Cognitive Status: History of cognitive impairments - at baseline                      Exercises      General Comments        Pertinent Vitals/Pain Pain Assessment: 0-10 Pain Score: 7  Pain Location: Back and legs. Pain Descriptors / Indicators: Throbbing;Sore Pain Intervention(s): Monitored during session;Premedicated before session;Repositioned    Home Living                      Prior Function            PT Goals (current goals can now be found in the care plan section) Acute Rehab PT Goals Patient Stated Goal: not stated PT Goal Formulation: With patient Time For Goal Achievement: 02/19/15 Potential to Achieve Goals: Good Progress towards PT goals: Progressing toward goals    Frequency  Min 5X/week    PT Plan Current plan remains appropriate    Co-evaluation             End of Session Equipment Utilized During Treatment: Gait belt Activity Tolerance: Patient limited by fatigue;Patient  limited by pain Patient left: in chair;with call bell/phone within reach     Time: 1135-1159 PT Time Calculation (min) (ACUTE ONLY): 24 min  Charges:  $Therapeutic Activity: 23-37 mins                    G CodesCatarina Hartshorn, [place] 02/13/2015, 2:03 PM

## 2015-02-24 ENCOUNTER — Ambulatory Visit: Payer: Commercial Managed Care - HMO | Admitting: Physical Medicine & Rehabilitation

## 2015-03-12 ENCOUNTER — Other Ambulatory Visit: Payer: Self-pay | Admitting: Registered Nurse

## 2015-03-12 ENCOUNTER — Encounter: Payer: Commercial Managed Care - HMO | Attending: Physical Medicine & Rehabilitation | Admitting: Registered Nurse

## 2015-03-12 ENCOUNTER — Encounter: Payer: Self-pay | Admitting: Registered Nurse

## 2015-03-12 VITALS — BP 106/74 | HR 68 | Resp 14

## 2015-03-12 DIAGNOSIS — M170 Bilateral primary osteoarthritis of knee: Secondary | ICD-10-CM | POA: Diagnosis not present

## 2015-03-12 DIAGNOSIS — G8114 Spastic hemiplegia affecting left nondominant side: Secondary | ICD-10-CM

## 2015-03-12 DIAGNOSIS — M13861 Other specified arthritis, right knee: Secondary | ICD-10-CM | POA: Insufficient documentation

## 2015-03-12 DIAGNOSIS — M75102 Unspecified rotator cuff tear or rupture of left shoulder, not specified as traumatic: Secondary | ICD-10-CM | POA: Insufficient documentation

## 2015-03-12 DIAGNOSIS — M7061 Trochanteric bursitis, right hip: Secondary | ICD-10-CM | POA: Insufficient documentation

## 2015-03-12 DIAGNOSIS — M13812 Other specified arthritis, left shoulder: Secondary | ICD-10-CM | POA: Insufficient documentation

## 2015-03-12 DIAGNOSIS — G249 Dystonia, unspecified: Secondary | ICD-10-CM | POA: Insufficient documentation

## 2015-03-12 DIAGNOSIS — C679 Malignant neoplasm of bladder, unspecified: Secondary | ICD-10-CM | POA: Diagnosis not present

## 2015-03-12 DIAGNOSIS — M545 Low back pain: Secondary | ICD-10-CM | POA: Insufficient documentation

## 2015-03-12 DIAGNOSIS — M13862 Other specified arthritis, left knee: Secondary | ICD-10-CM | POA: Diagnosis not present

## 2015-03-12 DIAGNOSIS — M13871 Other specified arthritis, right ankle and foot: Secondary | ICD-10-CM | POA: Insufficient documentation

## 2015-03-12 DIAGNOSIS — I951 Orthostatic hypotension: Secondary | ICD-10-CM | POA: Insufficient documentation

## 2015-03-12 DIAGNOSIS — Z5181 Encounter for therapeutic drug level monitoring: Secondary | ICD-10-CM

## 2015-03-12 DIAGNOSIS — M79602 Pain in left arm: Secondary | ICD-10-CM | POA: Insufficient documentation

## 2015-03-12 DIAGNOSIS — M25512 Pain in left shoulder: Secondary | ICD-10-CM | POA: Insufficient documentation

## 2015-03-12 DIAGNOSIS — M13872 Other specified arthritis, left ankle and foot: Secondary | ICD-10-CM | POA: Insufficient documentation

## 2015-03-12 DIAGNOSIS — S069X0D Unspecified intracranial injury without loss of consciousness, subsequent encounter: Secondary | ICD-10-CM | POA: Diagnosis not present

## 2015-03-12 DIAGNOSIS — M5416 Radiculopathy, lumbar region: Secondary | ICD-10-CM

## 2015-03-12 DIAGNOSIS — S069X0S Unspecified intracranial injury without loss of consciousness, sequela: Secondary | ICD-10-CM | POA: Insufficient documentation

## 2015-03-12 DIAGNOSIS — G8110 Spastic hemiplegia affecting unspecified side: Secondary | ICD-10-CM

## 2015-03-12 DIAGNOSIS — M961 Postlaminectomy syndrome, not elsewhere classified: Secondary | ICD-10-CM | POA: Diagnosis not present

## 2015-03-12 DIAGNOSIS — G894 Chronic pain syndrome: Secondary | ICD-10-CM | POA: Diagnosis not present

## 2015-03-12 DIAGNOSIS — Z79899 Other long term (current) drug therapy: Secondary | ICD-10-CM | POA: Diagnosis not present

## 2015-03-12 MED ORDER — HYDROCODONE-ACETAMINOPHEN 5-325 MG PO TABS: 0.5000 | ORAL_TABLET | Freq: Four times a day (QID) | ORAL | Status: DC | PRN
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
## 2015-03-13 LAB — PMP ALCOHOL METABOLITE (ETG): Ethyl Glucuronide (EtG): NEGATIVE ng/mL

## 2015-03-16 LAB — OPIATES/OPIOIDS (LC/MS-MS)
CODEINE URINE: NEGATIVE ng/mL (ref <50)
HYDROCODONE: 2733 ng/mL (ref <50)
HYDROMORPHONE: 508 ng/mL (ref <50)
MORPHINE: NEGATIVE ng/mL (ref <50)
NORHYDROCODONE, UR: 3029 ng/mL (ref <50)
Noroxycodone, Ur: NEGATIVE ng/mL (ref <50)
OXYMORPHONE, URINE: NEGATIVE ng/mL (ref <50)
Oxycodone, ur: NEGATIVE ng/mL (ref <50)

## 2015-03-16 LAB — BENZODIAZEPINES (GC/LC/MS), URINE
ALPRAZOLAMU: NEGATIVE ng/mL (ref <25)
Clonazepam metabolite (GC/LC/MS), ur confirm: 2243 ng/mL — AB (ref <25)
Flurazepam metabolite (GC/LC/MS), ur confirm: NEGATIVE ng/mL (ref <50)
Lorazepam (GC/LC/MS), ur confirm: NEGATIVE ng/mL (ref <50)
MIDAZOLAMU: NEGATIVE ng/mL (ref <50)
Nordiazepam (GC/LC/MS), ur confirm: NEGATIVE ng/mL (ref <50)
Oxazepam (GC/LC/MS), ur confirm: NEGATIVE ng/mL (ref <50)
TRIAZOLAMU: NEGATIVE ng/mL (ref <50)
Temazepam (GC/LC/MS), ur confirm: NEGATIVE ng/mL (ref <50)

## 2015-03-19 LAB — PRESCRIPTION MONITORING PROFILE (SOLSTAS)
AMPHETAMINE/METH: NEGATIVE ng/mL
BARBITURATE SCREEN, URINE: NEGATIVE ng/mL
Buprenorphine, Urine: NEGATIVE ng/mL
COCAINE METABOLITES: NEGATIVE ng/mL
CREATININE, URINE: 206.24 mg/dL (ref >20.0)
Cannabinoid Scrn, Ur: NEGATIVE ng/mL
Carisoprodol, Urine: NEGATIVE ng/mL
FENTANYL URINE: NEGATIVE ng/mL
MDMA URINE: NEGATIVE ng/mL
MEPERIDINE UR: NEGATIVE ng/mL
Methadone Screen, Urine: NEGATIVE ng/mL
Nitrites, Initial: NEGATIVE ug/mL
OXYCODONE SCRN UR: NEGATIVE ng/mL
PROPOXYPHENE: NEGATIVE ng/mL
TAPENTADOLUR: NEGATIVE ng/mL
Tramadol Scrn, Ur: NEGATIVE ng/mL
ZOLPIDEM, URINE: NEGATIVE ng/mL
pH, Initial: 5.2 pH (ref 4.5–8.9)

## 2015-03-22 ENCOUNTER — Encounter (HOSPITAL_COMMUNITY): Payer: Self-pay | Admitting: Emergency Medicine

## 2015-03-22 ENCOUNTER — Emergency Department (HOSPITAL_COMMUNITY): Payer: Commercial Managed Care - HMO

## 2015-03-22 ENCOUNTER — Inpatient Hospital Stay (HOSPITAL_COMMUNITY)
Admission: EM | Admit: 2015-03-22 | Discharge: 2015-04-01 | DRG: 460 | Disposition: A | Discharge status: Skilled Nursing Facility | Payer: Commercial Managed Care - HMO | Attending: Neurosurgery | Admitting: Neurosurgery

## 2015-03-22 DIAGNOSIS — K219 Gastro-esophageal reflux disease without esophagitis: Secondary | ICD-10-CM | POA: Diagnosis present

## 2015-03-22 DIAGNOSIS — G8929 Other chronic pain: Secondary | ICD-10-CM | POA: Diagnosis present

## 2015-03-22 DIAGNOSIS — M170 Bilateral primary osteoarthritis of knee: Secondary | ICD-10-CM

## 2015-03-22 DIAGNOSIS — Z981 Arthrodesis status: Secondary | ICD-10-CM | POA: Diagnosis not present

## 2015-03-22 DIAGNOSIS — G8114 Spastic hemiplegia affecting left nondominant side: Secondary | ICD-10-CM

## 2015-03-22 DIAGNOSIS — Z791 Long term (current) use of non-steroidal anti-inflammatories (NSAID): Secondary | ICD-10-CM

## 2015-03-22 DIAGNOSIS — S32059A Unspecified fracture of fifth lumbar vertebra, initial encounter for closed fracture: Secondary | ICD-10-CM

## 2015-03-22 DIAGNOSIS — M549 Dorsalgia, unspecified: Secondary | ICD-10-CM

## 2015-03-22 DIAGNOSIS — K589 Irritable bowel syndrome without diarrhea: Secondary | ICD-10-CM | POA: Diagnosis present

## 2015-03-22 DIAGNOSIS — I10 Essential (primary) hypertension: Secondary | ICD-10-CM | POA: Diagnosis present

## 2015-03-22 DIAGNOSIS — Z87820 Personal history of traumatic brain injury: Secondary | ICD-10-CM | POA: Diagnosis not present

## 2015-03-22 DIAGNOSIS — M1990 Unspecified osteoarthritis, unspecified site: Secondary | ICD-10-CM | POA: Diagnosis present

## 2015-03-22 DIAGNOSIS — R569 Unspecified convulsions: Secondary | ICD-10-CM | POA: Diagnosis present

## 2015-03-22 DIAGNOSIS — F419 Anxiety disorder, unspecified: Secondary | ICD-10-CM | POA: Diagnosis present

## 2015-03-22 DIAGNOSIS — M5416 Radiculopathy, lumbar region: Secondary | ICD-10-CM

## 2015-03-22 DIAGNOSIS — S32009A Unspecified fracture of unspecified lumbar vertebra, initial encounter for closed fracture: Secondary | ICD-10-CM | POA: Diagnosis present

## 2015-03-22 DIAGNOSIS — M4326 Fusion of spine, lumbar region: Secondary | ICD-10-CM

## 2015-03-22 LAB — URINALYSIS, ROUTINE W REFLEX MICROSCOPIC
Bilirubin Urine: NEGATIVE
GLUCOSE, UA: NEGATIVE mg/dL
Hgb urine dipstick: NEGATIVE
Ketones, ur: NEGATIVE mg/dL
NITRITE: NEGATIVE
Protein, ur: NEGATIVE mg/dL
SPECIFIC GRAVITY, URINE: 1.026 (ref 1.005–1.030)
Urobilinogen, UA: 0.2 mg/dL (ref 0.0–1.0)
pH: 7 (ref 5.0–8.0)

## 2015-03-22 LAB — CBC
HCT: 35.8 % — ABNORMAL LOW (ref 36.0–46.0)
HEMATOCRIT: 35.3 % — AB (ref 36.0–46.0)
Hemoglobin: 11.7 g/dL — ABNORMAL LOW (ref 12.0–15.0)
Hemoglobin: 11.5 g/dL — ABNORMAL LOW (ref 12.0–15.0)
MCH: 29.4 pg (ref 26.0–34.0)
MCH: 28.6 pg (ref 26.0–34.0)
MCHC: 33.1 g/dL (ref 30.0–36.0)
MCHC: 32.1 g/dL (ref 30.0–36.0)
MCV: 88.7 fL (ref 78.0–100.0)
MCV: 89.1 fL (ref 78.0–100.0)
Platelets: 266 10*3/uL (ref 150–400)
Platelets: 261 10*3/uL (ref 150–400)
RBC: 3.98 MIL/uL (ref 3.87–5.11)
RBC: 4.02 MIL/uL (ref 3.87–5.11)
RDW: 13.9 % (ref 11.5–15.5)
RDW: 13.9 % (ref 11.5–15.5)
WBC: 11.3 10*3/uL — ABNORMAL HIGH (ref 4.0–10.5)
WBC: 10.8 10*3/uL — AB (ref 4.0–10.5)

## 2015-03-22 LAB — I-STAT CHEM 8, ED
BUN: 12 mg/dL (ref 6–20)
CALCIUM ION: 1.17 mmol/L (ref 1.12–1.23)
CREATININE: 0.4 mg/dL — AB (ref 0.44–1.00)
Chloride: 103 mmol/L (ref 101–111)
Glucose, Bld: 148 mg/dL — ABNORMAL HIGH (ref 65–99)
HEMATOCRIT: 38 % (ref 36.0–46.0)
HEMOGLOBIN: 12.9 g/dL (ref 12.0–15.0)
Potassium: 3.8 mmol/L (ref 3.5–5.1)
SODIUM: 140 mmol/L (ref 135–145)
TCO2: 22 mmol/L (ref 0–100)

## 2015-03-22 LAB — CREATININE, SERUM
Creatinine, Ser: 0.44 mg/dL (ref 0.44–1.00)
GFR calc Af Amer: >60 mL/min (ref >60)
GFR calc non Af Amer: >60 mL/min (ref >60)

## 2015-03-22 LAB — URINE MICROSCOPIC-ADD ON

## 2015-03-22 MED ORDER — DICLOFENAC SODIUM 1 % TD GEL
2.0000 g | Freq: Four times a day (QID) | TRANSDERMAL | Status: DC
  Administered 2015-03-22 – 2015-04-01 (×32): 2 g via TOPICAL
  Filled 2015-03-22 (×2): qty 100

## 2015-03-22 MED ORDER — IOHEXOL 300 MG/ML  SOLN
100.0000 mL | Freq: Once | INTRAMUSCULAR | Status: AC | PRN
  Administered 2015-03-22: 100 mL via INTRAVENOUS

## 2015-03-22 MED ORDER — HYDROCODONE-ACETAMINOPHEN 5-325 MG PO TABS
0.5000 | ORAL_TABLET | Freq: Four times a day (QID) | ORAL | Status: DC | PRN
  Administered 2015-03-22 – 2015-03-31 (×23): 1 via ORAL
  Filled 2015-03-22 (×25): qty 1

## 2015-03-22 MED ORDER — LORAZEPAM 1 MG PO TABS: 1.0000 mg | ORAL_TABLET | Freq: Once | ORAL | Status: DC

## 2015-03-22 MED ORDER — HYDROCODONE-ACETAMINOPHEN 5-325 MG PO TABS: 2.0000 | ORAL_TABLET | Freq: Once | ORAL | Status: DC

## 2015-03-22 MED ORDER — METOPROLOL SUCCINATE ER 25 MG PO TB24
50.0000 mg | ORAL_TABLET | Freq: Every day | ORAL | Status: DC
  Administered 2015-03-23 – 2015-04-01 (×9): 50 mg via ORAL
  Filled 2015-03-22 (×10): qty 2

## 2015-03-22 MED ORDER — HEPARIN SODIUM (PORCINE) 5000 UNIT/ML IJ SOLN
5000.0000 [IU] | Freq: Three times a day (TID) | INTRAMUSCULAR | Status: DC
  Administered 2015-03-22 – 2015-04-01 (×28): 5000 [IU] via SUBCUTANEOUS
  Filled 2015-03-22 (×28): qty 1

## 2015-03-22 MED ORDER — HYDROCODONE-ACETAMINOPHEN 5-325 MG PO TABS
1.0000 | ORAL_TABLET | Freq: Once | ORAL | Status: AC
  Administered 2015-03-22: 1 via ORAL
  Filled 2015-03-22: qty 1

## 2015-03-22 MED ORDER — FLUTICASONE PROPIONATE 50 MCG/ACT NA SUSP
1.0000 | Freq: Every day | NASAL | Status: DC | PRN
  Filled 2015-03-22: qty 16

## 2015-03-22 MED ORDER — MORPHINE SULFATE 4 MG/ML IJ SOLN
4.0000 mg | Freq: Once | INTRAMUSCULAR | Status: AC
  Administered 2015-03-22: 4 mg via INTRAVENOUS
  Filled 2015-03-22 (×2): qty 1

## 2015-03-22 MED ORDER — SENNA 8.6 MG PO TABS
1.0000 | ORAL_TABLET | Freq: Two times a day (BID) | ORAL | Status: DC
  Administered 2015-03-22 – 2015-04-01 (×18): 8.6 mg via ORAL
  Filled 2015-03-22 (×18): qty 1

## 2015-03-22 MED ORDER — SODIUM CHLORIDE 0.9 % IV SOLN: INTRAVENOUS | Status: DC

## 2015-03-22 MED ORDER — MORPHINE SULFATE 4 MG/ML IJ SOLN
4.0000 mg | Freq: Once | INTRAMUSCULAR | Status: AC
  Administered 2015-03-22: 4 mg via INTRAVENOUS
  Filled 2015-03-22: qty 1

## 2015-03-22 MED ORDER — LORAZEPAM 2 MG/ML IJ SOLN
1.0000 mg | Freq: Once | INTRAMUSCULAR | Status: AC
  Administered 2015-03-22: 1 mg via INTRAVENOUS
  Filled 2015-03-22: qty 1

## 2015-03-22 MED ORDER — HYDROCHLOROTHIAZIDE 12.5 MG PO CAPS
12.5000 mg | ORAL_CAPSULE | Freq: Every day | ORAL | Status: DC
  Administered 2015-03-22 – 2015-04-01 (×11): 12.5 mg via ORAL
  Filled 2015-03-22 (×11): qty 1

## 2015-03-22 MED ORDER — ALISKIREN FUMARATE 150 MG PO TABS
300.0000 mg | ORAL_TABLET | Freq: Every day | ORAL | Status: DC
  Administered 2015-03-22 – 2015-04-01 (×10): 300 mg via ORAL
  Filled 2015-03-22 (×12): qty 2

## 2015-03-22 MED ORDER — PANTOPRAZOLE SODIUM 40 MG PO TBEC
40.0000 mg | DELAYED_RELEASE_TABLET | Freq: Every day | ORAL | Status: DC
  Administered 2015-03-23 – 2015-04-01 (×10): 40 mg via ORAL
  Filled 2015-03-22 (×10): qty 1

## 2015-03-22 MED ORDER — ALPRAZOLAM 0.5 MG PO TABS
0.5000 mg | ORAL_TABLET | Freq: Three times a day (TID) | ORAL | Status: DC | PRN
  Administered 2015-03-23 – 2015-03-31 (×14): 0.5 mg via ORAL
  Filled 2015-03-22 (×13): qty 1

## 2015-03-22 MED ORDER — ALISKIREN-HYDROCHLOROTHIAZIDE 300-12.5 MG PO TABS: 1.0000 | ORAL_TABLET | Freq: Every day | ORAL | Status: DC

## 2015-03-22 MED ORDER — CLONAZEPAM 0.5 MG PO TABS
0.5000 mg | ORAL_TABLET | Freq: Three times a day (TID) | ORAL | Status: DC
  Administered 2015-03-22 – 2015-04-01 (×27): 0.5 mg via ORAL
  Filled 2015-03-22 (×28): qty 1

## 2015-03-22 MED ORDER — CLONAZEPAM 0.5 MG PO TABS: 0.5000 mg | ORAL_TABLET | Freq: Three times a day (TID) | ORAL | Status: DC

## 2015-03-22 MED ORDER — HYDROMORPHONE HCL 1 MG/ML IJ SOLN
0.5000 mg | INTRAMUSCULAR | Status: DC | PRN
  Administered 2015-03-22 – 2015-03-30 (×30): 0.5 mg via INTRAVENOUS
  Filled 2015-03-22 (×29): qty 1

## 2015-03-22 MED ORDER — DOCUSATE SODIUM 100 MG PO CAPS
100.0000 mg | ORAL_CAPSULE | Freq: Two times a day (BID) | ORAL | Status: DC
  Administered 2015-03-22 – 2015-04-01 (×19): 100 mg via ORAL
  Filled 2015-03-22 (×20): qty 1

## 2015-03-22 MED ORDER — LEVETIRACETAM 500 MG PO TABS
500.0000 mg | ORAL_TABLET | Freq: Three times a day (TID) | ORAL | Status: DC
  Administered 2015-03-22 – 2015-04-01 (×27): 500 mg via ORAL
  Filled 2015-03-22 (×28): qty 1

## 2015-03-22 MED ORDER — MELOXICAM 15 MG PO TABS
15.0000 mg | ORAL_TABLET | Freq: Every day | ORAL | Status: DC
  Administered 2015-03-22 – 2015-04-01 (×8): 15 mg via ORAL
  Filled 2015-03-22 (×12): qty 1

## 2015-03-22 NOTE — ED Notes (Signed)
Phlebotomy at bedside.

## 2015-03-22 NOTE — Progress Notes (Signed)
Orthopedic Tech Progress Note Patient Details:  [name] 15-Mar-1961 [id number] Called brace order in to [employer]. Patient ID: Kendra [name], female   DOB: 01-07-1961, 54 y.o.   MRN: [id number]   Darrol Poke 03/22/2015, 6:17 PM

## 2015-03-22 NOTE — ED Notes (Signed)
Pt made aware of bed assignement
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
## 2015-03-22 NOTE — ED Notes (Signed)
EMT called phlebotomy about blood collection.  Unsuccessful attempt made.

## 2015-03-22 NOTE — ED Notes (Signed)
Went to assess pt. Tori, PA helped pt onto a bedpan.
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
## 2015-03-22 NOTE — ED Notes (Signed)
Pt here with c/o back pain , pt has had recent back surgery

## 2015-03-22 NOTE — ED Provider Notes (Signed)
Patient complains of low back pain radiating to both feet onset April 2016 immediately after having had lumbar laminectomy. She presented by EMS today as pain was worsened and she could not get out of bed this morning. She denies fever. Denies incontinence. Patient reports to me that she walks with cane also has motorized scooter at home.  Orlie Dakin, MD 03/22/15 [phone]

## 2015-03-23 MED ORDER — CYCLOBENZAPRINE HCL 10 MG PO TABS
10.0000 mg | ORAL_TABLET | Freq: Three times a day (TID) | ORAL | Status: DC
  Administered 2015-03-23 – 2015-03-24 (×3): 10 mg via ORAL
  Filled 2015-03-23 (×3): qty 1

## 2015-03-23 NOTE — Progress Notes (Signed)
No issues overnight. Pt does cont to report back and R>L leg pain and spasms.  EXAM:  BP 129/68 mmHg  Pulse 88  Temp(Src) 98.2 F (36.8 C) (Oral)  Resp 20  SpO2 94%  Awake, alert, oriented  Speech fluent, appropriate  CN grossly intact  Moving RLE well  IMPRESSION:  54 y.o. female with L5 fracture   PLAN: - Cont IV dilaudid PRN - Flexeril 10mg  scheduled TID - LSO brace

## 2015-03-23 NOTE — Progress Notes (Signed)
Pt arrived to 4N05 at 1838.  Pt A&O x 4, c/o 8/10 acute lower back pain, site with recent healed incision.   Pt V/S taken, Pt voided in bedpan, Pt in some distress r/t pain.  Family at the bedside. Diet ordered, pt medicated with 0.5 IV Dilaudid, will monitor.

## 2015-03-24 ENCOUNTER — Other Ambulatory Visit: Payer: Self-pay | Admitting: Neurosurgery

## 2015-03-24 MED ORDER — CARISOPRODOL 350 MG PO TABS
350.0000 mg | ORAL_TABLET | Freq: Three times a day (TID) | ORAL | Status: DC
  Administered 2015-03-24 – 2015-04-01 (×22): 350 mg via ORAL
  Filled 2015-03-24 (×23): qty 1

## 2015-03-24 NOTE — Progress Notes (Signed)
No issues overnight. Pt is more comfortable, but continues to have leg spasms not really helped by flexeril.  EXAM:  BP 146/67 mmHg  Pulse 78  Temp(Src) 98.1 F (36.7 C) (Oral)  Resp 20  SpO2 94%  Awake, alert, oriented  Moving RLE well  IMPRESSION:  54 y.o. female with fracture through L4-5 interspace/fusion mass after previous L2-S1 fusion and L2-3, L5-S1 instrumentation.  PLAN: - Cont bedrest  - will change flexeril to soma - Pt will likely require operative stabilization  I reviewed the CT findings with the patient and her sisters. Treatment options were discussed, including conservative treatment with a brace vs operative stabilization. The fracture appears relatively unstable to me, mimicking a chance-type fracture. I recommended stabilization with rod construct from the previously placed L2-3 to L5-S1 screws, and posterolateral fusion. Risks/benefits of the surgery were reviewed. All questions were answered, and they are willing to proceed with surgery.

## 2015-03-25 NOTE — Progress Notes (Signed)
Urine drug screen for this encounter is consistent for prescribed medication 

## 2015-03-25 NOTE — Progress Notes (Signed)
Patient ID: Kendra [name], female   DOB: September 28, 1961, 54 y.o.   MRN: [id number] Pain under control while resting. To or this friday

## 2015-03-26 NOTE — Progress Notes (Signed)
No issues overnight. Pt reports unchanged back pain and leg cramps.  EXAM:  BP 138/68 mmHg  Pulse 80  Temp(Src) 98.1 F (36.7 C) (Oral)  Resp 20  SpO2 98%  Awake, alert, oriented  Speech fluent, appropriate  CN grossly intact  Good strength  IMPRESSION:  54 y.o. female with L4-5 fracture  PLAN: - OR Friday for stabilization/fusion

## 2015-03-27 MED ORDER — DIAZEPAM 5 MG PO TABS
5.0000 mg | ORAL_TABLET | Freq: Three times a day (TID) | ORAL | Status: DC | PRN
  Administered 2015-03-27 – 2015-03-31 (×8): 5 mg via ORAL
  Filled 2015-03-27 (×9): qty 1

## 2015-03-28 ENCOUNTER — Inpatient Hospital Stay (HOSPITAL_COMMUNITY): Payer: Commercial Managed Care - HMO | Admitting: Certified Registered Nurse Anesthetist

## 2015-03-28 ENCOUNTER — Encounter (HOSPITAL_COMMUNITY): Payer: Self-pay | Admitting: Certified Registered Nurse Anesthetist

## 2015-03-28 ENCOUNTER — Encounter (HOSPITAL_COMMUNITY)
Admission: EM | Disposition: A | Discharge status: Skilled Nursing Facility | Payer: Self-pay | Source: Home / Self Care | Attending: Neurosurgery

## 2015-03-28 ENCOUNTER — Inpatient Hospital Stay (HOSPITAL_COMMUNITY): Payer: Commercial Managed Care - HMO

## 2015-03-28 LAB — MRSA PCR SCREENING: MRSA by PCR: NEGATIVE

## 2015-03-28 SURGERY — POSTERIOR LUMBAR FUSION 2 WITH HARDWARE REMOVAL
Anesthesia: General | Site: Back

## 2015-03-28 MED ORDER — FENTANYL CITRATE (PF) 250 MCG/5ML IJ SOLN
INTRAMUSCULAR | Status: AC
  Filled 2015-03-28: qty 5

## 2015-03-28 MED ORDER — HYDROMORPHONE HCL 1 MG/ML IJ SOLN
0.2500 mg | INTRAMUSCULAR | Status: DC | PRN
  Administered 2015-03-28 (×2): 0.5 mg via INTRAVENOUS

## 2015-03-28 MED ORDER — LIDOCAINE HCL (CARDIAC) 20 MG/ML IV SOLN
INTRAVENOUS | Status: AC
  Filled 2015-03-28: qty 5

## 2015-03-28 MED ORDER — ONDANSETRON HCL 4 MG/2ML IJ SOLN
INTRAMUSCULAR | Status: DC | PRN
  Administered 2015-03-28: 4 mg via INTRAVENOUS

## 2015-03-28 MED ORDER — SURGIFOAM 100 EX MISC
CUTANEOUS | Status: DC | PRN
  Administered 2015-03-28: 20 mL via TOPICAL

## 2015-03-28 MED ORDER — GELATIN ABSORBABLE MT POWD
OROMUCOSAL | Status: DC | PRN
  Administered 2015-03-28: 10 mL via TOPICAL

## 2015-03-28 MED ORDER — PROPOFOL 10 MG/ML IV BOLUS
INTRAVENOUS | Status: DC | PRN
  Administered 2015-03-28: 140 mg via INTRAVENOUS
  Administered 2015-03-28: 20 mg via INTRAVENOUS

## 2015-03-28 MED ORDER — GLYCOPYRROLATE 0.2 MG/ML IJ SOLN
INTRAMUSCULAR | Status: AC
  Filled 2015-03-28: qty 3

## 2015-03-28 MED ORDER — MIDAZOLAM HCL 5 MG/5ML IJ SOLN
INTRAMUSCULAR | Status: DC | PRN
  Administered 2015-03-28: 2 mg via INTRAVENOUS

## 2015-03-28 MED ORDER — HYDROMORPHONE HCL 1 MG/ML IJ SOLN
INTRAMUSCULAR | Status: AC
  Filled 2015-03-28: qty 1

## 2015-03-28 MED ORDER — FENTANYL CITRATE (PF) 100 MCG/2ML IJ SOLN
INTRAMUSCULAR | Status: DC | PRN
  Administered 2015-03-28: 50 ug via INTRAVENOUS
  Administered 2015-03-28: 150 ug via INTRAVENOUS
  Administered 2015-03-28 (×3): 25 ug via INTRAVENOUS

## 2015-03-28 MED ORDER — LIDOCAINE HCL (CARDIAC) 20 MG/ML IV SOLN
INTRAVENOUS | Status: DC | PRN
  Administered 2015-03-28: 70 mg via INTRAVENOUS

## 2015-03-28 MED ORDER — LACTATED RINGERS IV SOLN
INTRAVENOUS | Status: DC | PRN
  Administered 2015-03-28 (×2): via INTRAVENOUS

## 2015-03-28 MED ORDER — PROMETHAZINE HCL 25 MG/ML IJ SOLN: 6.2500 mg | INTRAMUSCULAR | Status: DC | PRN

## 2015-03-28 MED ORDER — 0.9 % SODIUM CHLORIDE (POUR BTL) OPTIME
TOPICAL | Status: DC | PRN
  Administered 2015-03-28: 1000 mL

## 2015-03-28 MED ORDER — ROCURONIUM BROMIDE 100 MG/10ML IV SOLN
INTRAVENOUS | Status: DC | PRN
  Administered 2015-03-28: 50 mg via INTRAVENOUS

## 2015-03-28 MED ORDER — ARTIFICIAL TEARS OP OINT
TOPICAL_OINTMENT | OPHTHALMIC | Status: DC | PRN
Start: 2015-03-28 — End: 2015-03-28
  Administered 2015-03-28: 1 via OPHTHALMIC

## 2015-03-28 MED ORDER — ONDANSETRON HCL 4 MG/2ML IJ SOLN
INTRAMUSCULAR | Status: AC
  Filled 2015-03-28: qty 2

## 2015-03-28 MED ORDER — PHENYLEPHRINE HCL 10 MG/ML IJ SOLN
INTRAMUSCULAR | Status: DC | PRN
  Administered 2015-03-28 (×2): 40 ug via INTRAVENOUS
  Administered 2015-03-28: 80 ug via INTRAVENOUS

## 2015-03-28 MED ORDER — GLYCOPYRROLATE 0.2 MG/ML IJ SOLN
INTRAMUSCULAR | Status: DC | PRN
  Administered 2015-03-28: 0.6 mg via INTRAVENOUS

## 2015-03-28 MED ORDER — VECURONIUM BROMIDE 10 MG IV SOLR
INTRAVENOUS | Status: DC | PRN
  Administered 2015-03-28: 1 mg via INTRAVENOUS
  Administered 2015-03-28: 2 mg via INTRAVENOUS
  Administered 2015-03-28: 1 mg via INTRAVENOUS

## 2015-03-28 MED ORDER — SODIUM CHLORIDE 0.9 % IR SOLN
Status: DC | PRN
  Administered 2015-03-28: 500 mL

## 2015-03-28 MED ORDER — MIDAZOLAM HCL 2 MG/2ML IJ SOLN
INTRAMUSCULAR | Status: AC
  Filled 2015-03-28: qty 2

## 2015-03-28 MED ORDER — PHENYLEPHRINE 40 MCG/ML (10ML) SYRINGE FOR IV PUSH (FOR BLOOD PRESSURE SUPPORT)
PREFILLED_SYRINGE | INTRAVENOUS | Status: AC
  Filled 2015-03-28: qty 10

## 2015-03-28 MED ORDER — VANCOMYCIN HCL IN DEXTROSE 1-5 GM/200ML-% IV SOLN
INTRAVENOUS | Status: AC
  Administered 2015-03-28: 1000 mg via INTRAVENOUS
  Filled 2015-03-28: qty 200

## 2015-03-28 MED ORDER — NEOSTIGMINE METHYLSULFATE 10 MG/10ML IV SOLN
INTRAVENOUS | Status: DC | PRN
  Administered 2015-03-28: 4 mg via INTRAVENOUS

## 2015-03-28 MED ORDER — MEPERIDINE HCL 25 MG/ML IJ SOLN: 6.2500 mg | INTRAMUSCULAR | Status: DC | PRN

## 2015-03-28 MED ORDER — PROPOFOL 10 MG/ML IV BOLUS
INTRAVENOUS | Status: AC
  Filled 2015-03-28: qty 20

## 2015-03-28 MED ORDER — VECURONIUM BROMIDE 10 MG IV SOLR
INTRAVENOUS | Status: AC
  Filled 2015-03-28: qty 10

## 2015-03-28 MED ORDER — ROCURONIUM BROMIDE 50 MG/5ML IV SOLN
INTRAVENOUS | Status: AC
  Filled 2015-03-28: qty 1

## 2015-03-28 MED ORDER — FENTANYL CITRATE (PF) 100 MCG/2ML IJ SOLN: 25.0000 ug | INTRAMUSCULAR | Status: DC | PRN

## 2015-03-28 MED ORDER — ARTIFICIAL TEARS OP OINT
TOPICAL_OINTMENT | OPHTHALMIC | Status: AC
  Filled 2015-03-28: qty 3.5

## 2015-03-28 SURGICAL SUPPLY — 75 items
90D Caps ×18 IMPLANT
BAG DECANTER FOR FLEXI CONT (MISCELLANEOUS) ×3 IMPLANT
BENZOIN TINCTURE PRP APPL 2/3 (GAUZE/BANDAGES/DRESSINGS) IMPLANT
BLADE CLIPPER SURG (BLADE) IMPLANT
BLADE SURG 11 STRL SS (BLADE) ×3 IMPLANT
BONE CANC CHIPS 20CC PCAN1/4 (Bone Implant) ×3 IMPLANT
BUR MATCHSTICK NEURO 3.0 LAGG (BURR) ×3 IMPLANT
CANISTER SUCT 3000ML PPV (MISCELLANEOUS) ×3 IMPLANT
CAP LCK SPNE (Orthopedic Implant) ×2 IMPLANT
CAP LOCK SPINE RADIUS (Orthopedic Implant) ×2 IMPLANT
CAP LOCKING (Orthopedic Implant) ×4 IMPLANT
CAP LOCKING 90D50000LTS (MISCELLANEOUS) ×12 IMPLANT
CATH FOLEY 2WAY SLVR  5CC 14FR (CATHETERS) ×2
CATH FOLEY 2WAY SLVR 5CC 14FR (CATHETERS) ×1 IMPLANT
CHIPS CANC BONE 20CC PCAN1/4 (Bone Implant) ×1 IMPLANT
CLOSURE WOUND 1/2 X4 (GAUZE/BANDAGES/DRESSINGS)
CONT SPEC 4OZ CLIKSEAL STRL BL (MISCELLANEOUS) ×3 IMPLANT
COVER BACK TABLE 60X90IN (DRAPES) ×3 IMPLANT
CROSSLINK MED VARI MULTI ANGL (Orthopedic Implant) ×3 IMPLANT
CROSSLINK MEDIUM (Orthopedic Implant) ×3 IMPLANT
DECANTER SPIKE VIAL GLASS SM (MISCELLANEOUS) ×3 IMPLANT
DERMABOND ADHESIVE PROPEN (GAUZE/BANDAGES/DRESSINGS) ×2
DERMABOND ADVANCED .7 DNX6 (GAUZE/BANDAGES/DRESSINGS) ×1 IMPLANT
DRAPE C-ARM 42X72 X-RAY (DRAPES) ×6 IMPLANT
DRAPE C-ARMOR (DRAPES) ×3 IMPLANT
DRAPE LAPAROTOMY 100X72X124 (DRAPES) ×3 IMPLANT
DRAPE MICROSCOPE LEICA (MISCELLANEOUS) ×3 IMPLANT
DRAPE POUCH INSTRU U-SHP 10X18 (DRAPES) ×3 IMPLANT
DRAPE SURG 17X23 STRL (DRAPES) ×3 IMPLANT
DRSG OPSITE POSTOP 4X8 (GAUZE/BANDAGES/DRESSINGS) ×3 IMPLANT
DURAPREP 26ML APPLICATOR (WOUND CARE) ×3 IMPLANT
ELECT REM PT RETURN 9FT ADLT (ELECTROSURGICAL) ×3
ELECTRODE REM PT RTRN 9FT ADLT (ELECTROSURGICAL) ×1 IMPLANT
GAUZE SPONGE 4X4 12PLY STRL (GAUZE/BANDAGES/DRESSINGS) IMPLANT
GAUZE SPONGE 4X4 16PLY XRAY LF (GAUZE/BANDAGES/DRESSINGS) IMPLANT
GLOVE BIOGEL PI IND STRL 7.5 (GLOVE) ×1 IMPLANT
GLOVE BIOGEL PI INDICATOR 7.5 (GLOVE) ×2
GLOVE ECLIPSE 7.0 STRL STRAW (GLOVE) ×3 IMPLANT
GLOVE EXAM NITRILE LRG STRL (GLOVE) IMPLANT
GLOVE EXAM NITRILE MD LF STRL (GLOVE) IMPLANT
GLOVE EXAM NITRILE XL STR (GLOVE) IMPLANT
GLOVE EXAM NITRILE XS STR PU (GLOVE) IMPLANT
GOWN STRL REUS W/ TWL LRG LVL3 (GOWN DISPOSABLE) ×2 IMPLANT
GOWN STRL REUS W/ TWL XL LVL3 (GOWN DISPOSABLE) IMPLANT
GOWN STRL REUS W/TWL 2XL LVL3 (GOWN DISPOSABLE) IMPLANT
GOWN STRL REUS W/TWL LRG LVL3 (GOWN DISPOSABLE) ×4
GOWN STRL REUS W/TWL XL LVL3 (GOWN DISPOSABLE)
HEMOSTAT POWDER KIT SURGIFOAM (HEMOSTASIS) ×3 IMPLANT
KIT BASIN OR (CUSTOM PROCEDURE TRAY) ×3 IMPLANT
KIT INFUSE MEDIUM (Orthopedic Implant) ×3 IMPLANT
KIT ROOM TURNOVER OR (KITS) ×3 IMPLANT
NEEDLE HYPO 18GX1.5 BLUNT FILL (NEEDLE) IMPLANT
NEEDLE HYPO 25X1 1.5 SAFETY (NEEDLE) ×3 IMPLANT
NEEDLE SPNL 18GX3.5 QUINCKE PK (NEEDLE) IMPLANT
NS IRRIG 1000ML POUR BTL (IV SOLUTION) ×3 IMPLANT
PACK LAMINECTOMY NEURO (CUSTOM PROCEDURE TRAY) ×3 IMPLANT
PAD ARMBOARD 7.5X6 YLW CONV (MISCELLANEOUS) ×9 IMPLANT
ROD 110MM (Rod) ×4 IMPLANT
ROD SPNL 110X5.5XNS TI RDS (Rod) ×2 IMPLANT
RUBBERBAND STERILE (MISCELLANEOUS) ×6 IMPLANT
SPONGE LAP 4X18 X RAY DECT (DISPOSABLE) IMPLANT
SPONGE SURGIFOAM ABS GEL 100 (HEMOSTASIS) ×3 IMPLANT
STRIP BIOACTIVE VITOSS 25X100X (Neuro Prosthesis/Implant) ×3 IMPLANT
STRIP CLOSURE SKIN 1/2X4 (GAUZE/BANDAGES/DRESSINGS) IMPLANT
SUT VIC AB 0 CT1 18XCR BRD8 (SUTURE) ×4 IMPLANT
SUT VIC AB 0 CT1 8-18 (SUTURE) ×8
SUT VIC AB 2-0 CT1 18 (SUTURE) ×3 IMPLANT
SUT VIC AB 3-0 FS2 27 (SUTURE) IMPLANT
SUT VICRYL 3-0 RB1 18 ABS (SUTURE) ×6 IMPLANT
SYR 20ML ECCENTRIC (SYRINGE) ×3 IMPLANT
SYR 3ML LL SCALE MARK (SYRINGE) IMPLANT
TOWEL OR 17X24 6PK STRL BLUE (TOWEL DISPOSABLE) ×3 IMPLANT
TOWEL OR 17X26 10 PK STRL BLUE (TOWEL DISPOSABLE) ×3 IMPLANT
TRAP SPECIMEN MUCOUS 40CC (MISCELLANEOUS) ×3 IMPLANT
WATER STERILE IRR 1000ML POUR (IV SOLUTION) ×3 IMPLANT

## 2015-03-28 NOTE — Transfer of Care (Signed)
Immediate Anesthesia Transfer of Care Note  Patient: Kendra [name]  Procedure(s) Performed: Procedure(s) with comments: L34 L45 posterior lateral fusion w/revision of hardware L2-S1 (N/A) - L34 L45 posterior lateral fusion with instrumentation with revision of hardware L2-S1  Patient Location: PACU  Anesthesia Type:General  Level of Consciousness: awake and alert   Airway & Oxygen Therapy: Patient Spontanous Breathing and Patient connected to nasal cannula oxygen  Post-op Assessment: Report given to RN, Post -op Vital signs reviewed and stable and Patient moving all extremities X 4  Post vital signs: Reviewed and stable  Last Vitals:  Filed Vitals:   03/28/15 1623  BP:   Pulse:   Temp: 36.7 C  Resp:     Complications: No apparent anesthesia complications

## 2015-03-28 NOTE — Op Note (Signed)
PREOP DIAGNOSIS:  1. L4-5 Fracture  POSTOP DIAGNOSIS: Same  PROCEDURE: 1. Exploration of previous L3-4, L4-5 fusion 2. Revision of previous hardware with removal of rods and placement of new 121mm rod 3. Posterolateral fusion, L3-4, L4-5 4. Use of cancellous bone allograft 5. Use of bone morphogenic protein  SURGEON: Dr. Consuella Lose, MD  ASSISTANT: Dr. Sherley Bounds, MD  ANESTHESIA: General Endotracheal  EBL: 200cc  SPECIMENS: None  DRAINS: Subfacial JP  COMPLICATIONS: None immediate  CONDITION: Hemodynamically stable to PACU  HISTORY: Kendra [name] is a 54 y.o. female presenting to the hospital with severe back and R>L leg pain. CT scan demonstrated a fracture through the previous fusion mass at L4-5. Surigcal stabilization was therefore indicated. The risks and benefits were reviewed with the patient and family. After all questions were answered, consent was obtained.  PROCEDURE IN DETAIL: After informed consent was obtained and witnessed, the patient was brought to the operating room. After induction of general anesthesia, the patient was positioned on the operative table in the prone position. All pressure points were meticulously padded. Skin incision was then marked out and prepped and draped in the usual sterile fashion.  After timeout was conducted, skin was made sharply, and Bovie electrocautery was used to dissect the subcutaneous tissue, and although significant scar tissue was seen, the lumbodorsal fascia was identified and incised. A small fluid pocket was identified at the superior aspect near L2-3, the most recent surgery. The spinous process of L1 was identified. Dissection was carried out laterally to identify the L2, L3 screws. This was subsequently carried farther inferior to identify the L5 and S1 screws. At this point, some retained retractors were placed and placed. Bovie electrocautery and periosteal dissectors were used to identify the transverse process  at L5, L3, and remaining portions of L4 bilaterally. Once these were dissected free, the setscrews were removed at L2, L3, L5, and S1. Utilizing a high-speed drill, the transverse processes and any exposed bone lateral to the screws was decorticated bilaterally. BMP, cancellous bone chips, and Vitoss were then placed in the lateral gutters along the decorticated bone surfaces.   A new 115mm rod was placed spanning L2-S1, set screws were replaced, and final tightened. Two crosslinks were then placed across the L2-3 interspace, and L5-S1 interspace.  Good hemostasis was confirmed. A subfacial JP was placed and tunneled subcutaneously. Wound was then closed in layers using interrupted 0 and 3-0.  The skin was closed using dermabond. Sterile dressing was then applied. The patient was then transferred to the stretcher and taken to the PACU in stable hemodynamic condition.  At the end of the case all sponge, needle, cottonoid, and instrument counts were correct.
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
## 2015-03-28 NOTE — Progress Notes (Signed)
No issues overnight. Pt cont to c/o pain and spasms in the legs.  EXAM:  BP 124/76 mmHg  Pulse 85  Temp(Src) 98.7 F (37.1 C) (Oral)  Resp 17  SpO2 96%  Awake, alert, oriented  Speech fluent, appropriate  CN grossly intact  5/5 BUE/BLE   IMPRESSION:  54 y.o. female with L4-5 fracture  PLAN: - For stabilization L2-S1/ posterolateral fusion today

## 2015-03-28 NOTE — Anesthesia Postprocedure Evaluation (Signed)
  Anesthesia Post-op Note  Patient: Kendra [name]  Procedure(s) Performed: Procedure(s) with comments: L34 L45 posterior lateral fusion w/revision of hardware L2-S1 (N/A) - L34 L45 posterior lateral fusion with instrumentation with revision of hardware L2-S1  Patient Location: PACU  Anesthesia Type: General   Level of Consciousness: awake, alert  and oriented  Airway and Oxygen Therapy: Patient Spontanous Breathing  Post-op Pain: mild  Post-op Assessment: Post-op Vital signs reviewed  Post-op Vital Signs: Reviewed  Last Vitals:  Filed Vitals:   03/28/15 1724  BP:   Pulse:   Temp: 36.9 C  Resp:     Complications: No apparent anesthesia complications

## 2015-03-28 NOTE — Anesthesia Procedure Notes (Signed)
Procedure Name: Intubation Date/Time: 03/28/2015 1:43 PM Performed by: Garrison Columbus T Pre-anesthesia Checklist: Patient identified, Emergency Drugs available, Suction available and Patient being monitored Patient Re-evaluated:Patient Re-evaluated prior to inductionOxygen Delivery Method: Circle system utilized Preoxygenation: Pre-oxygenation with 100% oxygen Intubation Type: IV induction Ventilation: Mask ventilation without difficulty Laryngoscope Size: Miller and 2 Grade View: Grade I Tube type: Oral Tube size: 7.5 mm Number of attempts: 1 Airway Equipment and Method: Stylet Placement Confirmation: ETT inserted through vocal cords under direct vision,  positive ETCO2 and breath sounds checked- equal and bilateral Secured at: 21 cm Tube secured with: Tape Dental Injury: Teeth and Oropharynx as per pre-operative assessment

## 2015-03-29 LAB — GLUCOSE, CAPILLARY: Glucose-Capillary: 199 mg/dL — ABNORMAL HIGH (ref 65–99)

## 2015-03-29 NOTE — Progress Notes (Signed)
Patient ID: Kendra [name], female   DOB: [birth year], 54 y.o.   MRN: [id number] C/o incisional pain and cramps. Able to move both legs. Pt to see. See orders

## 2015-03-30 NOTE — Progress Notes (Signed)
Patient seems to be doing okay. Has appropriate postoperative back soreness. Denies significant leg pain.

## 2015-03-30 NOTE — Progress Notes (Signed)
Patient with cramping in her legs all evening regardless of pain medications. Continuing to monitor. Jabron Weese, Rande Brunt, RN

## 2015-03-31 NOTE — Clinical Social Work Note (Signed)
Clinical Social Work Assessment  Patient Details  Name: Kendra [name] MRN: [id number] Date of Birth: 02-10-1961  Date of referral:  03/31/15               Reason for consult:  Facility Placement, Discharge Planning                Permission sought to share information with:  Case Manager, Facility Sport and exercise psychologist, PCP, Family Supports Permission granted to share information::  Yes, Verbal Permission Granted  Name::      Kendra [name] )  Agency::   (SNF's )  Relationship::   (Sister )  Contact Information:   [phone])  Housing/Transportation Living arrangements for the past 2 months:  [place] of Information:   (Siblings ) Patient Interpreter Needed:  None Criminal Activity/Legal Involvement Pertinent to Current Situation/Hospitalization:  No - Comment as needed Significant Relationships:  Siblings, Other Family Members Lives with:  Siblings Do you feel safe going back to the place where you live?  Yes Need for family participation in patient care:  Yes (Comment)  Care giving concerns:  No care giving concerns reported at this time by family.    Social Worker assessment / plan: Holiday representative met with patient and spoke with pt's sister, Kendra [name] via telephone in reference to post-acute placement for SNF. CSW introduced CSW role and SNF process. CSW reviewed and provided SNF list at bedside. Pt's sister reported pt was a resident at [facility] however does not wish for pt to return due to a number of reasons. Pt's sister prefers placement at [facility] and open to extended SNF search. Pt's sister stated she would further review SNF list once she arrived to the hospital. No further concerns reported at this time. CSW to complete FL-2 and fax to SNF's in [place] area. CSW will continue to follow pt and pt's family for continued support and to facilitate pt's discharge needs.   Employment status:  Disabled (Comment on  whether or not currently receiving Disability) Insurance information:  Managed Medicare PT Recommendations:  [place] / Referral to community resources:  [place]  Patient/Family's Response to care:  Pt alert and oriented however advised CSW to contact her sister for further assistance. Pt's sister expressed that she does not want for pt to return to [place] however agreeable to new SNF search. Pt and pt's sister pleasant, accepting and appreciated social work intervention.   Patient/Family's Understanding of and Emotional Response to Diagnosis, Current Treatment, and Prognosis:  Pt's sister understanding of surgical procedure and current treatment. Pt's sister also aware she may need to choose a back up facility if pt can not be admitted into [facility].   Emotional Assessment Appearance:  Appears stated age Attitude/Demeanor/Rapport:   (Pleasant ) Affect (typically observed):  Accepting, Pleasant, Appropriate Orientation:  Oriented to Self, Oriented to Place, Oriented to  Time, Oriented to Situation Alcohol / Substance use:  Never Used Psych involvement (Current and /or in the community):  No (Comment)  Discharge Needs  Concerns to be addressed:  No discharge needs identified Readmission within the last 30 days:  No Current discharge risk:  None Barriers to Discharge:  No Barriers Identified   Glendon Axe, MSW, LCSWA [phone] 03/31/2015 2:33 PM

## 2015-03-31 NOTE — Evaluation (Signed)
Physical Therapy Evaluation Patient Details Name: Kendra [name] MRN: [id number] DOB: [birth year] Today's Date: 03/31/2015   History of Present Illness  54 y.o. female presenting to the hospital with severe back and R>L leg pain. CT scan demonstrated a fracture through the previous fusion mass at L4-5. Surigcal stabilization was therefore indicated. Patient s/p L34 L45 posterior lateral fusion w/revision of hardware L2-S1 (N/A) - L34 L45 posterior lateral fusion with instrumentation with revision of hardware L2-S1.  Clinical Impression  Patient demonstrates deficits in functional mobility as indicated below. Will need continued skilled PT to address deficits and maximize function. Will see as indicated and progress as tolerated. Patient will likely need ST SNF upon acute discharge, will assess mobility further once brace modifications have been made.    Follow Up Recommendations SNF    Equipment Recommendations  None recommended by PT    Recommendations for Other Services       Precautions / Restrictions Precautions Precautions: Back;Fall Precaution Booklet Issued: No Precaution Comments: educated on back precautions Required Braces or Orthoses: Spinal Brace;Other Brace/Splint Spinal Brace: Lumbar corset;Applied in sitting position Other Brace/Splint: L AFO built into shoe Restrictions Weight Bearing Restrictions: No      Mobility  Bed Mobility Overal bed mobility: Needs Assistance Bed Mobility: Rolling;Sidelying to Sit Rolling: Mod assist         General bed mobility comments: Patient able to assist with slef positioning, some limitations secondary to pain, attempted to roll to apply Brace with [employer] rep present. Brace requires modifications. Unable to perform further mobility at this time  Transfers                    Ambulation/Gait                Stairs            Wheelchair Mobility    Modified Rankin (Stroke Patients Only)        Balance                                             Pertinent Vitals/Pain Pain Assessment: Faces Faces Pain Scale: Hurts even more Pain Location: back and legs Pain Descriptors / Indicators: Cramping;Discomfort;Grimacing Pain Intervention(s): Monitored during session;Repositioned    Home Living Family/patient expects to be discharged to:: Private residence Living Arrangements: Alone Available Help at Discharge: Family;Available PRN/intermittently Type of Home: House Home Access: Ramped entrance     Home Layout: One level Home Equipment: Grab bars - tub/shower;Bedside commode;Shower seat - built in;Cane - quad;Wheelchair - Psychologist, educational      Prior Function Level of Independence: Needs assistance   Gait / Transfers Assistance Needed: pt used [employer] for very short distance ambulation, otherwise used Theatre manager.  ADL's / Homemaking Assistance Needed: pt able to dress/bathe herself; family would bring in food for pt and perform house chores.         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   Lower Extremity Assessment: LLE deficits/detail   LLE Deficits / Details: Overall wekaness and decreased coordination.       Communication   Communication: Expressive difficulties  Cognition Arousal/Alertness: Awake/alert Behavior During Therapy: WFL for tasks assessed/performed  Overall Cognitive Status: History of cognitive impairments - at baseline                      General Comments      Exercises        Assessment/Plan    PT Assessment Patient needs continued PT services  PT Diagnosis Difficulty walking   PT Problem List Decreased strength;Decreased activity tolerance;Decreased balance;Decreased mobility;Decreased coordination;Decreased knowledge of use of DME;Decreased knowledge of  precautions;Pain  PT Treatment Interventions DME instruction;Gait training;Functional mobility training;Therapeutic activities;Therapeutic exercise;Balance training;Neuromuscular re-education;Patient/family education   PT Goals (Current goals can be found in the Care Plan section) Acute Rehab PT Goals Patient Stated Goal: not stated PT Goal Formulation: With patient Time For Goal Achievement: 02/19/15 Potential to Achieve Goals: Good    Frequency Min 5X/week   Barriers to discharge        Co-evaluation               End of Session   Activity Tolerance: Patient limited by pain;Other (comment) (brace modifications required) Patient left: in bed;with call bell/phone within reach Nurse Communication: Mobility status         Time: 1152-1203 PT Time Calculation (min) (ACUTE ONLY): 11 min   Charges:   PT Evaluation $Initial PT Evaluation Tier I: 1 Procedure     PT G CodesDuncan Dull 04/30/15, 12:13 PM Alben Deeds, [place] DPT  [phone]

## 2015-03-31 NOTE — Clinical Social Work Placement (Signed)
   CLINICAL SOCIAL WORK PLACEMENT  NOTE  Date:  03/31/2015  Patient Details  Name: Kendra [name] MRN: [id number] Date of Birth: 07/29/1961  Clinical Social Work is seeking post-discharge placement for this patient at the [place] level of care (*CSW will initial, date and re-position this form in  chart as items are completed):  Yes   Patient/family provided with [place] Work Department's list of facilities offering this level of care within the geographic area requested by the patient (or if unable, by the patient's family).  Yes   Patient/family informed of their freedom to choose among providers that offer the needed level of care, that participate in Medicare, Medicaid or managed care program needed by the patient, have an available bed and are willing to accept the patient.  Yes   Patient/family informed of [place]'s ownership interest in [facility] and [facility], as well as of the fact that they are under no obligation to receive care at these facilities.  PASRR submitted to EDS on       PASRR number received on       Existing PASRR number confirmed on 03/31/15     FL2 transmitted to all facilities in geographic area requested by pt/family on 03/31/15     FL2 transmitted to all facilities within larger geographic area on       Patient informed that his/her managed care company has contracts with or will negotiate with certain facilities, including the following:            Patient/family informed of bed offers received.  Patient chooses bed at       Physician recommends and patient chooses bed at      Patient to be transferred to   on  .  Patient to be transferred to facility by       Patient family notified on   of transfer.  Name of family member notified:        PHYSICIAN Please sign FL2     Additional Comment:    _______________________________________________ Glendon Axe, MSW, [place] [phone] 03/31/2015 2:59 PM

## 2015-03-31 NOTE — Progress Notes (Signed)
No issues overnight. Pt reports leg cramps, but significantly improved from preop. Overall feels well.  EXAM:  BP 108/63 mmHg  Pulse 86  Temp(Src) 98 F (36.7 C) (Oral)  Resp 16  Ht 5\' 4"  (1.626 m)  Wt 86 kg (189 lb 9.5 oz)  BMI 32.53 kg/m2  SpO2 96%  Awake, alert, oriented  Speech fluent, appropriate  CN grossly intact  5/5 BUE/BLE  Wound c/d/i, Hemovac in place  IMPRESSION:  54 y.o. female POD#2 s/p revision of hardware L2-S1 for L4-5 fracture, with improvement in preop sx.  PLAN: - Will need TLSO brace, not Aspen fusion brace - PT/OT - Placement

## 2015-03-31 NOTE — Clinical Social Work Note (Signed)
CSW confirmed that patient has used 28 skilled days and is currently in co-pay days for SNF at $160 per day until day 100. CSW has contacted SNF's to verify if patient's secondary insurance will cover co-pays.  CSW remains available. FL-2 on chart for MD signature.   Glendon Axe, MSW, LCSWA [phone] 03/31/2015 3:28 PM

## 2015-03-31 NOTE — Clinical Social Work Note (Signed)
Clinical Social Worker has contacted [employer] to verify available skilled days.   FL-2 on chart for MD signature.   Glendon Axe, MSW, LCSWA [phone] 03/31/2015 3:04 PM

## 2015-03-31 NOTE — Progress Notes (Signed)
Informed Ortho tech for TLSO brace. [employer] (Ortho tech representative) will relay message to vendor and brace will be deliver today.   Ave Filter, RN

## 2015-03-31 NOTE — Progress Notes (Signed)
Chaplain responded to referral from pt nurse and pspiritual care consult. Pt described how difficult this year has been both medically, and losing her mother earlier this year. She reports that she has ample support from her sisters, but is concerned about her future. She is concerned about walking again and doing some of the things she enjoys, like going to church. Chaplain offered prayer. Pt appreciative of chaplain visit.   03/31/15 1000  Clinical Encounter Type  Visited With Patient  Visit Type Initial;Spiritual support  Referral From Nurse  Spiritual Encounters  Spiritual Needs Emotional;Prayer  Stress Factors  Patient Stress Factors Health changes;Major life changes;Loss  Kendra [name], Barbette Hair, Chaplain 03/31/2015 10:02 AM

## 2015-04-01 MED ORDER — HYDROCODONE-ACETAMINOPHEN 5-325 MG PO TABS: 1.0000 | ORAL_TABLET | Freq: Four times a day (QID) | ORAL | Status: DC | PRN

## 2015-04-01 MED ORDER — DIAZEPAM 5 MG PO TABS: 5.0000 mg | ORAL_TABLET | Freq: Three times a day (TID) | ORAL | Status: DC | PRN

## 2015-04-01 NOTE — Discharge Summary (Signed)
Physician Discharge Summary  Patient ID: Kendra [name] MRN: [id number] DOB/AGE: January 30, 1961 54 y.o.  Admit date: 03/22/2015 Discharge date: 04/01/2015  Admission Diagnoses: Lumbar 4-5 fracture  Discharge Diagnoses: Same Active Problems:   Lumbar vertebral fracture   Discharged Condition: Stable  Hospital Course:  [name] is a 54 y.o. female admitted after presenting to the ED with severe back pain and leg pain, R>L. Workup included CT L-spine which demonstrated a fracture through the disc space and previous fusion mass at L4-5. She was observed in the neuroscience unit, pain was managed with IV medications and spasmolytics, and she was ultimately taken to the OR for stabilization utilizing the previously placed hardware. She had an uneventful hospital course, reporting good improvement in her preop pain/spasms, and expected postoperative back soreness. She was seen by PT/OT, and was found to require SNF placement, as this is where she was residing prior to admission.  Treatments: Surgery - L2-S1 stabilization/exploration of fusion  Discharge Exam: Blood pressure 116/67, pulse 76, temperature 98.3 F (36.8 C), temperature source Oral, resp. rate 20, height 5\' 4"  (1.626 m), weight 86 kg (189 lb 9.5 oz), SpO2 94 %. Awake, alert, oriented Speech fluent, appropriate CN grossly intact Moving BLE well Wound c/d/i  Disposition: 03-Skilled Nursing Facility     Medication List    TAKE these medications        ALPRAZolam 0.5 MG tablet  Commonly known as:  XANAX  Take 0.5 mg by mouth every 8 (eight) hours as needed (restless leg).     BOTOX IM  Inject 1 Dose into the muscle every 3 (three) months.     clonazePAM 1 MG tablet  Commonly known as:  KLONOPIN  Take 0.5-1 tablets (0.5-1 mg total) by mouth 3 (three) times daily.     diazepam 5 MG tablet  Commonly known as:  VALIUM  Take 1 tablet (5 mg total) by mouth every 8 (eight) hours as needed for muscle spasms.     diclofenac sodium 1 % Gel  Commonly known as:  VOLTAREN  Apply topically 4 (four) times daily. Apply to shoulders, back and knees     esomeprazole 20 MG capsule  Commonly known as:  NEXIUM  Take 20 mg by mouth daily at 12 noon.     fluticasone 50 MCG/ACT nasal spray  Commonly known as:  FLONASE  Place 1 spray into both nostrils daily as needed for allergies.     HYDROcodone-acetaminophen 5-325 MG per tablet  Commonly known as:  NORCO/VICODIN  Take 1-2 tablets by mouth every 6 (six) hours as needed for moderate pain.     KEPPRA 500 MG tablet  Generic drug:  levETIRAcetam  TAKE 1 TABLET BY MOUTH 3 TIMES A DAY     meloxicam 15 MG tablet  Commonly known as:  MOBIC  TAKE 1 TABLET EVERY DAY WITH FOOD     meloxicam 15 MG tablet  Commonly known as:  MOBIC  TAKE 1 TABLET EVERY DAY WITH FOOD     metoprolol succinate 50 MG 24 hr tablet  Commonly known as:  TOPROL-XL  TAKE 1 TABLET BY MOUTH EVERY DAY IMMEDIATELY FOLLOWING A MEAL     omeprazole 20 MG capsule  Commonly known as:  PRILOSEC  Take 20 mg by mouth daily.     TEKTURNA HCT 300-12.5 MG Tabs  Generic drug:  [place]-Hydrochlorothiazide  Take 1 tablet by mouth daily.           Follow-up Information  Follow up with POOL,HENRY A, MD In 3 weeks.   Specialty:  Neurosurgery   Contact information:   [address] [place] [address] [phone]       Follow up with [facility], MD In 2 weeks.   Specialty:  Internal Medicine   Contact information:   [place], [place] [address] [phone]       Signed: Consuella Lose, C 04/01/2015, 8:02 AM

## 2015-04-01 NOTE — Progress Notes (Signed)
Physical Therapy Treatment Patient Details Name: STEVEN VEAZIE MRN: [id number] DOB: May 13, 1961 Today's Date: 04/01/2015    History of Present Illness 54 y.o. female presenting to the hospital with severe back and R>L leg pain. CT scan demonstrated a fracture through the previous fusion mass at L4-5. Surigcal stabilization was therefore indicated. Patient s/p L34 L45 posterior lateral fusion w/revision of hardware L2-S1 (N/A) - L34 L45 posterior lateral fusion with instrumentation with revision of hardware L2-S1.    PT Comments    Patient seen for therapy session OOB to chair. Patient assisted with donning of brace and bed mobility. Patient able to tolerate stand pivot transfer to chair with assist. Still complains of significant LLE radiating pain. Will continue to see and progress as tolerated.   Follow Up Recommendations  SNF     Equipment Recommendations  None recommended by PT    Recommendations for Other Services       Precautions / Restrictions Precautions Precautions: Back;Fall Precaution Booklet Issued: No Precaution Comments: educated on back precautions Required Braces or Orthoses: Spinal Brace;Other Brace/Splint Spinal Brace: Lumbar corset;Applied in sitting position Other Brace/Splint: L AFO built into shoe Restrictions Weight Bearing Restrictions: No    Mobility  Bed Mobility Overal bed mobility: Needs Assistance Bed Mobility: Rolling;Sidelying to Sit Rolling: Min assist         General bed mobility comments: Assist to reposition prior to rolling, patient was able to self assist with use of side rail  Transfers Overall transfer level: Needs assistance Equipment used: 1 person hand held assist Transfers: Sit to/from [facility] Sit to Stand: Mod assist Stand pivot transfers: Mod assist       General transfer comment: VCs for hand placement, elevated HOB to assist with power up to standing, HHA provided in addition to wrap around  support via gait belt  Ambulation/Gait             General Gait Details: will required 2 person assist, unable to perform at this time   Stairs            Wheelchair Mobility    Modified Rankin (Stroke Patients Only)       Balance Overall balance assessment: Needs assistance Sitting-balance support: Single extremity supported;Feet supported Sitting balance-Leahy Scale: Poor (to fair) Sitting balance - Comments: posterior lean initially to maintain static balance Postural control: Posterior lean Standing balance support: Single extremity supported Standing balance-Leahy Scale: Poor                      Cognition Arousal/Alertness: Awake/alert Behavior During Therapy: WFL for tasks assessed/performed Overall Cognitive Status: History of cognitive impairments - at baseline                      Exercises      General Comments        Pertinent Vitals/Pain Pain Assessment: 0-10 Pain Score: 8  Pain Location: back and left leg Pain Descriptors / Indicators: Cramping;Discomfort;Grimacing Pain Intervention(s): Monitored during session;Limited activity within patient's tolerance;Repositioned    Home Living                      Prior Function            PT Goals (current goals can now be found in the care plan section) Acute Rehab PT Goals Patient Stated Goal: not stated PT Goal Formulation: With patient Time For Goal Achievement: 02/19/15 Potential to Achieve Goals: Good Progress  towards PT goals: Progressing toward goals    Frequency  Min 5X/week    PT Plan Current plan remains appropriate    Co-evaluation             End of Session Equipment Utilized During Treatment: Gait belt;Back brace;Other (comment) (AFOs) Activity Tolerance: Patient limited by pain;Other (comment) Patient left: in chair;with call bell/phone within reach;with chair alarm set     Time: [phone] PT Time Calculation (min) (ACUTE ONLY): 28  min  Charges:  $Therapeutic Activity: 23-37 mins                    G CodesDuncan Dull 04/08/15, 8:51 AM Alben Deeds, PT DPT  [phone]

## 2015-04-01 NOTE — Progress Notes (Signed)
Hemovac not maintaining charge, small amount pink serous drainage on bed pad.   Turned frequently, skin intact.  Plan dressing change & D/C hemovac.  Will continue to monitor incision, pain well controlled in combination with muscle relaxers.

## 2015-04-01 NOTE — Progress Notes (Signed)
Report given to Nurse Louann from [place]. Information provided. Awaiting for transport.   Ave Filter, RN

## 2015-04-01 NOTE — Clinical Social Work Note (Signed)
Clinical Social Worker facilitated patient discharge including contacting patient family and facility to confirm patient discharge plans.  Clinical information faxed to facility and family agreeable with plan.  CSW arranged ambulance transport via [facility] to [place].  RN to call report prior to discharge.  DC packet prepared and on chart for transport.   Clinical Social Worker will sign off for now as social work intervention is no longer needed. Please consult [place] again if new need arises.  Glendon Axe, MSW, LCSWA [phone] 04/01/2015 2:47 PM

## 2015-04-01 NOTE — Progress Notes (Signed)
No issues overnight. Pt continues to state she is doing better.  EXAM:  BP 116/67 mmHg  Pulse 76  Temp(Src) 98.3 F (36.8 C) (Oral)  Resp 20  Ht 5\' 4"  (1.626 m)  Wt 86 kg (189 lb 9.5 oz)  BMI 32.53 kg/m2  SpO2 94%  Awake, alert, oriented  Speech fluent, appropriate  CN grossly intact  5/5 BUE/BLE   IMPRESSION:  54 y.o. female s/p L2-S1 stabilization for L4-5 fracture, doing well  PLAN: - D/C Hemovac - Discharge to SNF when bed available

## 2015-04-01 NOTE — Clinical Social Work Note (Signed)
[employer] Silver[employer] authorization obtained: [id number]. CSW contacted facility with updates and awaiting a returned phone call.   Glendon Axe, MSW, LCSWA [phone] 04/01/2015 11:27 AM

## 2015-04-01 NOTE — Clinical Social Work Note (Signed)
Patient has a bed at SNF, [facility]. CSW currently awaiting [employer] Silver[employer] insurance approval. Facility unsure if secondary insurance will cover co-pays of $160 per day. Patient's sister aware of co-pays days.   CSW remains available.  Glendon Axe, MSW, LCSWA [phone] 04/01/2015 10:33 AM

## 2015-04-01 NOTE — Clinical Social Work Placement (Signed)
   CLINICAL SOCIAL WORK PLACEMENT  NOTE  Date:  04/01/2015  Patient Details  Name: Kendra [name] MRN: [id number] Date of Birth: 1961-03-02  Clinical Social Work is seeking post-discharge placement for this patient at the [place] level of care (*CSW will initial, date and re-position this form in  chart as items are completed):  Yes   Patient/family provided with [facility] Work Department's list of facilities offering this level of care within the geographic area requested by the patient (or if unable, by the patient's family).  Yes   Patient/family informed of their freedom to choose among providers that offer the needed level of care, that participate in Medicare, Medicaid or managed care program needed by the patient, have an available bed and are willing to accept the patient.  Yes   Patient/family informed of [facility]'s ownership interest in [facility] and [facility], as well as of the fact that they are under no obligation to receive care at these facilities.  PASRR submitted to EDS on       PASRR number received on       Existing PASRR number confirmed on 03/31/15     FL2 transmitted to all facilities in geographic area requested by pt/family on 03/31/15     FL2 transmitted to all facilities within larger geographic area on       Patient informed that his/her managed care company has contracts with or will negotiate with certain facilities, including the following:        Yes   Patient/family informed of bed offers received.  Patient chooses bed at  [facility])     Physician recommends and patient chooses bed at      Patient to be transferred to  [facility] on 04/01/15.  Patient to be transferred to facility by  Corey Harold )     Patient family notified on 04/01/15 of transfer.  Name of family member notified:   (Pt's sister, [name] )     PHYSICIAN Please sign FL2      Additional Comment:    _______________________________________________ Rozell Searing, LCSW 04/01/2015, 2:46 PM
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
## 2015-05-05 ENCOUNTER — Encounter: Payer: Commercial Managed Care - HMO | Attending: Physical Medicine & Rehabilitation | Admitting: Registered Nurse

## 2015-05-05 ENCOUNTER — Encounter: Payer: Self-pay | Admitting: Registered Nurse

## 2015-05-05 VITALS — BP 130/64 | HR 78 | Resp 14

## 2015-05-05 DIAGNOSIS — M75102 Unspecified rotator cuff tear or rupture of left shoulder, not specified as traumatic: Secondary | ICD-10-CM | POA: Diagnosis not present

## 2015-05-05 DIAGNOSIS — C679 Malignant neoplasm of bladder, unspecified: Secondary | ICD-10-CM | POA: Diagnosis not present

## 2015-05-05 DIAGNOSIS — M25551 Pain in right hip: Secondary | ICD-10-CM

## 2015-05-05 DIAGNOSIS — Z5181 Encounter for therapeutic drug level monitoring: Secondary | ICD-10-CM | POA: Diagnosis not present

## 2015-05-05 DIAGNOSIS — M25512 Pain in left shoulder: Secondary | ICD-10-CM | POA: Insufficient documentation

## 2015-05-05 DIAGNOSIS — M13872 Other specified arthritis, left ankle and foot: Secondary | ICD-10-CM | POA: Insufficient documentation

## 2015-05-05 DIAGNOSIS — G249 Dystonia, unspecified: Secondary | ICD-10-CM | POA: Diagnosis not present

## 2015-05-05 DIAGNOSIS — M545 Low back pain: Secondary | ICD-10-CM | POA: Insufficient documentation

## 2015-05-05 DIAGNOSIS — X58XXXS Exposure to other specified factors, sequela: Secondary | ICD-10-CM | POA: Insufficient documentation

## 2015-05-05 DIAGNOSIS — I951 Orthostatic hypotension: Secondary | ICD-10-CM | POA: Diagnosis not present

## 2015-05-05 DIAGNOSIS — M13812 Other specified arthritis, left shoulder: Secondary | ICD-10-CM | POA: Diagnosis not present

## 2015-05-05 DIAGNOSIS — M13861 Other specified arthritis, right knee: Secondary | ICD-10-CM | POA: Diagnosis not present

## 2015-05-05 DIAGNOSIS — Z79899 Other long term (current) drug therapy: Secondary | ICD-10-CM

## 2015-05-05 DIAGNOSIS — M13871 Other specified arthritis, right ankle and foot: Secondary | ICD-10-CM | POA: Insufficient documentation

## 2015-05-05 DIAGNOSIS — M13862 Other specified arthritis, left knee: Secondary | ICD-10-CM | POA: Diagnosis not present

## 2015-05-05 DIAGNOSIS — M79602 Pain in left arm: Secondary | ICD-10-CM | POA: Insufficient documentation

## 2015-05-05 DIAGNOSIS — S069X0S Unspecified intracranial injury without loss of consciousness, sequela: Secondary | ICD-10-CM | POA: Insufficient documentation

## 2015-05-05 DIAGNOSIS — M961 Postlaminectomy syndrome, not elsewhere classified: Secondary | ICD-10-CM | POA: Diagnosis not present

## 2015-05-05 DIAGNOSIS — M170 Bilateral primary osteoarthritis of knee: Secondary | ICD-10-CM | POA: Insufficient documentation

## 2015-05-05 DIAGNOSIS — M7061 Trochanteric bursitis, right hip: Secondary | ICD-10-CM | POA: Diagnosis not present

## 2015-05-05 DIAGNOSIS — G894 Chronic pain syndrome: Secondary | ICD-10-CM | POA: Diagnosis not present
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
## 2015-06-09 NOTE — Patient Outreach (Signed)
[facility]) Care Management  06/09/2015  JUDY POLLMAN 08/09/1961 [id number]   Referral from [employer], assigned to Quinn Plowman, [facility] for patient outreach.  Darelle Kings L. Fabienne Nolasco, [place] Care Management Assistant

## 2015-06-16 NOTE — Patient Outreach (Signed)
[facility]) Care Management  06/16/2015  Kendra [name] 10/26/1960 [id number]   Reassigned per Leadership, assigned [name], RN.  Thanks, [name]. [place], [place] Assistant Phone: [phone] Fax: [phone]

## 2015-06-20 ENCOUNTER — Other Ambulatory Visit: Payer: Self-pay
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
## 2015-06-24 ENCOUNTER — Encounter
Payer: Commercial Managed Care - HMO | Attending: Physical Medicine & Rehabilitation | Admitting: Physical Medicine & Rehabilitation

## 2015-06-24 DIAGNOSIS — C679 Malignant neoplasm of bladder, unspecified: Secondary | ICD-10-CM | POA: Insufficient documentation

## 2015-06-24 DIAGNOSIS — I951 Orthostatic hypotension: Secondary | ICD-10-CM | POA: Insufficient documentation

## 2015-06-24 DIAGNOSIS — M79602 Pain in left arm: Secondary | ICD-10-CM | POA: Insufficient documentation

## 2015-06-24 DIAGNOSIS — M545 Low back pain: Secondary | ICD-10-CM | POA: Insufficient documentation

## 2015-06-24 DIAGNOSIS — S069X0S Unspecified intracranial injury without loss of consciousness, sequela: Secondary | ICD-10-CM | POA: Insufficient documentation

## 2015-06-24 DIAGNOSIS — M13871 Other specified arthritis, right ankle and foot: Secondary | ICD-10-CM | POA: Insufficient documentation

## 2015-06-24 DIAGNOSIS — G249 Dystonia, unspecified: Secondary | ICD-10-CM | POA: Insufficient documentation

## 2015-06-24 DIAGNOSIS — M13872 Other specified arthritis, left ankle and foot: Secondary | ICD-10-CM | POA: Insufficient documentation

## 2015-06-24 DIAGNOSIS — M13812 Other specified arthritis, left shoulder: Secondary | ICD-10-CM | POA: Insufficient documentation

## 2015-06-24 DIAGNOSIS — M170 Bilateral primary osteoarthritis of knee: Secondary | ICD-10-CM | POA: Insufficient documentation

## 2015-06-24 DIAGNOSIS — M75102 Unspecified rotator cuff tear or rupture of left shoulder, not specified as traumatic: Secondary | ICD-10-CM | POA: Insufficient documentation

## 2015-06-24 DIAGNOSIS — M961 Postlaminectomy syndrome, not elsewhere classified: Secondary | ICD-10-CM | POA: Insufficient documentation

## 2015-06-24 DIAGNOSIS — M25512 Pain in left shoulder: Secondary | ICD-10-CM | POA: Insufficient documentation

## 2015-06-24 DIAGNOSIS — M13861 Other specified arthritis, right knee: Secondary | ICD-10-CM | POA: Insufficient documentation

## 2015-06-24 DIAGNOSIS — M7061 Trochanteric bursitis, right hip: Secondary | ICD-10-CM | POA: Insufficient documentation

## 2015-06-24 DIAGNOSIS — M13862 Other specified arthritis, left knee: Secondary | ICD-10-CM | POA: Insufficient documentation

## 2015-06-26 ENCOUNTER — Other Ambulatory Visit: Payer: Self-pay
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
## 2015-07-07 ENCOUNTER — Ambulatory Visit: Payer: Commercial Managed Care - HMO | Admitting: Physical Medicine & Rehabilitation

## 2015-08-05 ENCOUNTER — Encounter: Payer: Medicare PPO | Admitting: Physical Medicine & Rehabilitation

## 2015-08-06 ENCOUNTER — Other Ambulatory Visit: Payer: Self-pay

## 2015-08-07 NOTE — Patient Outreach (Signed)
[facility]) Care Management   08/07/2015  Kendra [name] 07/29/1961 [id number]  Kendra [name] is an 54 y.o. female Arrived for home visit at 10:00. Patient answered door in her motorized wheelchair.  Subjective: Patient reports that she is doing well. Reports more social outings since last home visit. Patient reports that she feels like she is getting stronger. States that she is working more crossword puzzles. States that she is going out at least 2-3 times per week. Reports that she is going to her sisters tonight for a cookout.  Patient reports that she [facility] flu shot [facility]. Patient reports that she reviewed printed material on DM since last home visit.  Patient denies any new issues or concerns.   Objective:   Patient sitting in motorized wheelchair. Awake and alert.  Filed Vitals:   08/06/15 1004  BP: 110/78  Pulse: 90  Resp: 18  SpO2: 95%   Review of Systems  Constitutional: Negative.   HENT: Negative.   Eyes: Negative.   Respiratory: Negative.   Cardiovascular: Negative.   Gastrointestinal: Negative.   Genitourinary: Negative.   Musculoskeletal: Positive for joint pain.  Skin: Negative.   Neurological:       Denies any new deficits  Psychiatric/Behavioral: Negative.     Physical Exam  Constitutional: She is oriented to person, place, and time. She appears well-developed and well-nourished.  Cardiovascular: Normal rate.   Respiratory: Effort normal and breath sounds normal.  GI: Soft. Bowel sounds are normal.  Musculoskeletal: She exhibits no edema.  Neurological: She is alert and oriented to person, place, and time.  Skin: Skin is warm and dry.    Current Medications:   Current Outpatient Prescriptions  Medication Sig Dispense Refill  . ALPRAZolam (XANAX) 0.5 MG tablet Take 0.5 mg by mouth every 8 (eight) hours as needed (restless leg).   0  . b complex vitamins capsule Take 1 capsule by mouth daily.    . clonazePAM (KLONOPIN) 1 MG tablet  Take 0.5-1 tablets (0.5-1 mg total) by mouth 3 (three) times daily. 90 tablet 3  . Echinacea 125 MG CAPS Take 1 tablet by mouth 1 day or 1 dose.    . esomeprazole (NEXIUM) 20 MG capsule Take 20 mg by mouth daily at 12 noon.    . fluticasone (FLONASE) 50 MCG/ACT nasal spray Place 1 spray into both nostrils daily as needed for allergies.     [name] KEPPRA 500 MG tablet TAKE 1 TABLET BY MOUTH 3 TIMES A DAY 90 tablet 5  . loratadine (CLARITIN) 10 MG tablet Take 10 mg by mouth as needed.    . Melatonin 10 MG TABS Take 1 tablet by mouth at bedtime.    . meloxicam (MOBIC) 15 MG tablet TAKE 1 TABLET EVERY DAY WITH FOOD 30 tablet 3  . metFORMIN (GLUCOPHAGE) 500 MG tablet Take 1 tablet by mouth every morning.    . Misc Natural Products (OSTEO BI-FLEX TRIPLE STRENGTH PO) Take 1 tablet by mouth 1 day or 1 dose.    . MULTIPLE VITAMIN PO Take 1 tablet by mouth 1 day or 1 dose.    [name] omeprazole (PRILOSEC) 20 MG capsule Take 20 mg by mouth daily.  6  . TEKTURNA HCT 300-12.5 MG TABS Take 1 tablet by mouth daily.     . vitamin C (ASCORBIC ACID) 500 MG tablet Take 500 mg by mouth daily.    [name] acetaminophen (TYLENOL) 500 MG tablet Take 500 mg by mouth every 6 (  six) hours as needed.    . diazepam (VALIUM) 5 MG tablet Take 1 tablet (5 mg total) by mouth every 8 (eight) hours as needed for muscle spasms. (Patient not taking: Reported on 06/26/2015) 30 tablet 0  . diclofenac sodium (VOLTAREN) 1 % GEL Apply topically 4 (four) times daily. Apply to shoulders, back and knees    . HYDROcodone-acetaminophen (NORCO/VICODIN) 5-325 MG per tablet Take 1-2 tablets by mouth every 6 (six) hours as needed for moderate pain. (Patient not taking: Reported on 06/26/2015) 90 tablet 0  . metoprolol succinate (TOPROL-XL) 50 MG 24 hr tablet TAKE 1 TABLET BY MOUTH EVERY DAY IMMEDIATELY FOLLOWING A MEAL (Patient not taking: Reported on 06/26/2015) 30 tablet 4  . OnabotulinumtoxinA (BOTOX IM) Inject 1 Dose into the muscle every 3 (three) months.      No  current facility-administered medications for this visit.    Functional Status:   In your present state of health, do you have any difficulty performing the following activities: 06/26/2015 03/28/2015  Hearing? Y N  Vision? N N  Difficulty concentrating or making decisions? N N  Walking or climbing stairs? Y Y  Dressing or bathing? Y Y  Doing errands, shopping? Y -  Conservation officer, nature and eating ? Y -  Using the Toilet? N -  In the past six months, have you accidently leaked urine? Y -  Do you have problems with loss of bowel control? N -  Managing your Medications? Y -  Managing your Finances? Y -  Housekeeping or managing your Housekeeping? Y -    Fall/Depression Screening:    PHQ 2/9 Scores 06/26/2015  PHQ - 2 Score 2  PHQ- 9 Score 12   Fall Risk  08/07/2015 08/06/2015 06/26/2015  Falls in the past year? No Yes No  Risk for fall due to : - - Impaired balance/gait;Impaired mobility    Assessment:   (1) no questions about DM at this time. Has reviewed material since last home visit. (2) Again reviewed benefit of health plan for mail order pharmacy.  Reviewed insurance card with patient and process of calling customer service and ask to enroll in mail order pharmacy.  (3) [facility] flu [facility].  Plan:  (1) denies any questions about DM at this time. (2) Patient to call [employer] mail order pharmacy to enroll. (3) encouraged patient to share flu date with primary MD.  All goals met from previous care plan. Discussion with patient about new goals. Patient is unable to identify any new goals to work on. Patient felt she was ready for case closure. Reviewed with patient that her case could be reopened at any time if she needs assistance. Patient verbalized understanding.   Will send case closure to MD and patient.   THN CM Care Plan Problem One        Most Recent Value   Care Plan Problem One  Knowledge deficit related to DM    Role Documenting the Problem One  Care  Management [place] for Problem One  Active   THN Long Term Goal (31-90 days)  Patient will be able to verbalized increased knowledge of DM and carbs in the next 60 days.   THN Long Term Goal Start Date  06/26/15   [facility] Long Term Goal Met Date  08/07/15 Barrie Folk met]   Interventions for Problem One Long Term Goal  Provided [facility] DM packet and carb tear off handout. Reviewed foods to avoid like pasta,  potatoes, bread, rice and desserts.    THN CM Short Term Goal #1 (0-30 days)  Patient will able to verbalize any questions related to diabets after review of information provide in the next 30 days.   THN CM Short Term Goal #1 Start Date  06/26/15   [facility] CM Short Term Goal #1 Met Date  08/07/15 [goal met]   Interventions for Short Term Goal #1  Provided [facility] DM packet. Discussed with importance of following  DM diet.    [facility] CM Care Plan Problem Two        Most Recent Value   Care Plan Problem Two  Knowledge deficit realted to benefits of mail order pharmacy   Role Documenting the Problem Two  Care Management [place] for Problem Two  Active   Interventions for Problem Two Long Term Goal   Discussed with patient and sister anbout potential cost saving and benefits. Enocuraged patient and or sister to call phone number on back of insuarance card for assistance   [facility] Long Term Goal (31-90) days  Patient and or sister will call [employer] Mail order pharmacy review meds and potential cost savings in the next 31 days.   THN Long Term Goal Start Date  06/26/15   [facility] Long Term Goal Met Date  08/07/15 Barrie Folk met, Patient plans to call to enroll]    [facility] CM Care Plan Problem Three        Most Recent Value   Care Plan Problem Three  Social isolation   Role Documenting the Problem Three  Care Management Coordinator   Care Plan for Problem Three  Active   THN CM Short Term Goal #1 (0-30 days)  Patient will be able to verbalize 2 outings per week for the next 30 days.   THN CM Short Term  Goal #1 Start Date  06/26/15   [facility] CM Short Term Goal #1 Met Date  08/07/15 [goal met]   Interventions for Short Term Goal #1  Discussed options with patient like going to church, go outside in her scooter, going out with her caregiver in the evenings to do shopping. Discussed how this might decrease depression     [name], Therapist, sports, BSN, [facility] [employer] [phone]

## 2015-08-08 ENCOUNTER — Ambulatory Visit: Payer: Medicare PPO | Admitting: Physical Medicine & Rehabilitation

## 2015-08-12 ENCOUNTER — Other Ambulatory Visit: Payer: Self-pay | Admitting: Registered Nurse

## 2015-08-14 NOTE — Patient Outreach (Signed)
[name]  08/14/2015  Kendra [name] 16-Apr-1961 [id number]   Notification from [name], RN to close case due to goals met with [place] Management.  Thanks, [name]. [place], [place] Assistant Phone: [phone] Fax: [phone]

## 2015-08-15 ENCOUNTER — Encounter: Payer: Medicare PPO | Attending: Physical Medicine & Rehabilitation | Admitting: Physical Medicine & Rehabilitation

## 2015-08-15 ENCOUNTER — Encounter: Payer: Self-pay | Admitting: Physical Medicine & Rehabilitation

## 2015-08-15 VITALS — BP 133/73 | HR 87 | Resp 16

## 2015-08-15 DIAGNOSIS — M7061 Trochanteric bursitis, right hip: Secondary | ICD-10-CM | POA: Insufficient documentation

## 2015-08-15 DIAGNOSIS — M13861 Other specified arthritis, right knee: Secondary | ICD-10-CM | POA: Diagnosis not present

## 2015-08-15 DIAGNOSIS — M25512 Pain in left shoulder: Secondary | ICD-10-CM | POA: Diagnosis not present

## 2015-08-15 DIAGNOSIS — M5416 Radiculopathy, lumbar region: Secondary | ICD-10-CM | POA: Diagnosis not present

## 2015-08-15 DIAGNOSIS — C679 Malignant neoplasm of bladder, unspecified: Secondary | ICD-10-CM | POA: Diagnosis not present

## 2015-08-15 DIAGNOSIS — M13872 Other specified arthritis, left ankle and foot: Secondary | ICD-10-CM | POA: Diagnosis not present

## 2015-08-15 DIAGNOSIS — M75102 Unspecified rotator cuff tear or rupture of left shoulder, not specified as traumatic: Secondary | ICD-10-CM | POA: Diagnosis not present

## 2015-08-15 DIAGNOSIS — G8114 Spastic hemiplegia affecting left nondominant side: Secondary | ICD-10-CM | POA: Diagnosis not present

## 2015-08-15 DIAGNOSIS — S069X0S Unspecified intracranial injury without loss of consciousness, sequela: Secondary | ICD-10-CM | POA: Diagnosis not present

## 2015-08-15 DIAGNOSIS — M79602 Pain in left arm: Secondary | ICD-10-CM | POA: Insufficient documentation

## 2015-08-15 DIAGNOSIS — M961 Postlaminectomy syndrome, not elsewhere classified: Secondary | ICD-10-CM | POA: Diagnosis not present

## 2015-08-15 DIAGNOSIS — M170 Bilateral primary osteoarthritis of knee: Secondary | ICD-10-CM | POA: Insufficient documentation

## 2015-08-15 DIAGNOSIS — M5137 Other intervertebral disc degeneration, lumbosacral region: Secondary | ICD-10-CM | POA: Diagnosis not present

## 2015-08-15 DIAGNOSIS — Z87820 Personal history of traumatic brain injury: Secondary | ICD-10-CM

## 2015-08-15 DIAGNOSIS — M13871 Other specified arthritis, right ankle and foot: Secondary | ICD-10-CM | POA: Diagnosis not present

## 2015-08-15 DIAGNOSIS — M545 Low back pain: Secondary | ICD-10-CM | POA: Insufficient documentation

## 2015-08-15 DIAGNOSIS — M13812 Other specified arthritis, left shoulder: Secondary | ICD-10-CM | POA: Insufficient documentation

## 2015-08-15 DIAGNOSIS — G249 Dystonia, unspecified: Secondary | ICD-10-CM | POA: Diagnosis not present

## 2015-08-15 DIAGNOSIS — M13862 Other specified arthritis, left knee: Secondary | ICD-10-CM | POA: Diagnosis not present

## 2015-08-15 DIAGNOSIS — I951 Orthostatic hypotension: Secondary | ICD-10-CM | POA: Diagnosis not present

## 2015-08-15 MED ORDER — PROPRANOLOL HCL 20 MG PO TABS: 20.0000 mg | ORAL_TABLET | Freq: Three times a day (TID) | ORAL | Status: DC
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
## 2015-08-15 NOTE — Addendum Note (Signed)
Addended by: Alger Simons T on: 08/15/2015 04:53 PM   Modules accepted: Level of Service

## 2015-08-15 NOTE — Patient Instructions (Signed)
DO NOT RESUME TOPROL WHILE TAKING THE PROPRANOL  TALK TO DR. PENNER ABOUT INCREASING YOUR PM KLONOPIN TO 1MG   ASK DR. PENNER TO CHECK YOUR THYROID HORMONES, VITAMIN D, B12, FOLATE, CORTISOL LEVELS AS ABNORMALITIES MIGHT  CONTRIBUTE TO YOUR MEMORY LOSS.

## 2015-09-09 ENCOUNTER — Telehealth: Payer: Self-pay

## 2015-09-09 NOTE — Telephone Encounter (Signed)
Please advise on Propanolol?

## 2015-09-09 NOTE — Telephone Encounter (Signed)
Patient called to say the propanolol is not helping.  She is still shaking.  She also would like a refill of her clonazepam.

## 2015-09-09 NOTE — Telephone Encounter (Signed)
i am happy to refill klonopin. We can increase propranolol to 40mg  tid, but it is likely to drop her bp and hr too much. i really don't know what else to offer her to be honest.  We have tried NUMEROUS meds for this over the last 10 years

## 2015-09-10 ENCOUNTER — Other Ambulatory Visit: Payer: Self-pay

## 2015-09-10 DIAGNOSIS — G8114 Spastic hemiplegia affecting left nondominant side: Secondary | ICD-10-CM

## 2015-09-10 DIAGNOSIS — M5416 Radiculopathy, lumbar region: Secondary | ICD-10-CM

## 2015-09-10 MED ORDER — PROPRANOLOL HCL 20 MG PO TABS: 40.0000 mg | ORAL_TABLET | Freq: Three times a day (TID) | ORAL | Status: DC

## 2015-09-10 MED ORDER — CLONAZEPAM 1 MG PO TABS: 0.5000 mg | ORAL_TABLET | Freq: Three times a day (TID) | ORAL | Status: DC

## 2015-09-10 NOTE — Telephone Encounter (Signed)
Spoke with pt. Refilled her Klonopin. Also sent in refill for Propanolol 40 mg TID. Informed pt that it may drop her BP and HR. Informed pt to call us if she feels like her BP has gotten too low.

## 2015-10-01 ENCOUNTER — Telehealth: Payer: Self-pay | Admitting: *Deleted

## 2015-10-01 NOTE — Telephone Encounter (Signed)
Yes, I never said that there would be neurology referral. I did agree to order an MRI. Please contact her and let's work on setting another up. I don't remember where she would like to go for the study. thx

## 2015-10-01 NOTE — Telephone Encounter (Signed)
Patient is asking about a referral to a neurologist and is wondering why she has not been contacted for her MRI/Brain.  I reviewed her chart and did not see any notation for a referral to neurology.  However, I did see that she was referred to get a Brain MRI.  It looks as though the referral was closed due to patient refusing service. Due to patients TBI, it was difficult to understand pt's phone message.

## 2015-10-03 ENCOUNTER — Other Ambulatory Visit: Payer: Self-pay | Admitting: Physical Medicine & Rehabilitation

## 2015-10-06 ENCOUNTER — Encounter: Payer: Medicare PPO | Admitting: Physical Medicine & Rehabilitation

## 2015-10-14 ENCOUNTER — Other Ambulatory Visit: Payer: Self-pay

## 2015-11-04 ENCOUNTER — Ambulatory Visit
Admission: RE | Admit: 2015-11-04 | Discharge: 2015-11-04 | Disposition: A | Discharge status: Home / Self Care | Payer: Medicare PPO | Source: Ambulatory Visit | Attending: Physical Medicine & Rehabilitation | Admitting: Physical Medicine & Rehabilitation

## 2015-11-04 ENCOUNTER — Encounter: Payer: Self-pay | Admitting: Neurology

## 2015-11-04 ENCOUNTER — Ambulatory Visit (INDEPENDENT_AMBULATORY_CARE_PROVIDER_SITE_OTHER): Payer: Medicare PPO | Admitting: Neurology

## 2015-11-04 VITALS — BP 118/78 | HR 61 | Resp 16 | Wt 203.0 lb

## 2015-11-04 DIAGNOSIS — Z87820 Personal history of traumatic brain injury: Secondary | ICD-10-CM | POA: Diagnosis not present

## 2015-11-04 DIAGNOSIS — G8114 Spastic hemiplegia affecting left nondominant side: Secondary | ICD-10-CM

## 2015-11-04 DIAGNOSIS — IMO0001 Reserved for inherently not codable concepts without codable children: Secondary | ICD-10-CM | POA: Insufficient documentation

## 2015-11-04 DIAGNOSIS — R404 Transient alteration of awareness: Secondary | ICD-10-CM
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
## 2015-11-13 ENCOUNTER — Ambulatory Visit (INDEPENDENT_AMBULATORY_CARE_PROVIDER_SITE_OTHER): Payer: Medicare PPO | Admitting: Neurology

## 2015-11-13 DIAGNOSIS — R404 Transient alteration of awareness: Secondary | ICD-10-CM | POA: Diagnosis not present

## 2015-11-13 DIAGNOSIS — Z87820 Personal history of traumatic brain injury: Secondary | ICD-10-CM | POA: Diagnosis not present

## 2015-11-13 DIAGNOSIS — IMO0001 Reserved for inherently not codable concepts without codable children: Secondary | ICD-10-CM

## 2015-11-19 ENCOUNTER — Encounter: Payer: Self-pay | Admitting: Family Medicine

## 2015-11-21 ENCOUNTER — Telehealth: Payer: Self-pay | Admitting: Neurology

## 2015-11-21 NOTE — Telephone Encounter (Signed)
Discussed EEG results showing epileptiform discharges and need for seizure medication. I am still unsure if the daily staring spells she reports are all seizure-related though, would proceed with 24-hour EEG as scheduled. Continue Keppra. Patient expressed understanding and will f/u after EEG.

## 2015-11-21 NOTE — Procedures (Signed)
ELECTROENCEPHALOGRAM REPORT  Date of Study: 11/13/2015  Patient's Name: Kendra [name] MRN: [id number] Date of Birth: 1960-11-26  Referring Provider: Dr. Ellouise Newer  Clinical History: This is a 55 year old woman with a history of traumatic brain injury at age 73 with residual dysarthria and left hemiparesis, presenting for evaluation of "staring seizures" that occur on a daily basis on Keppra. She had one GTC 12 years ago.  Medications: Keppra, clonazepam, Xanax, Tekturna, Propranolol, melatonin  Technical Summary: A multichannel digital EEG recording measured by the international 10-20 system with electrodes applied with paste and impedances below 5000 ohms performed in our laboratory with EKG monitoring in an awake and drowsy patient.  Hyperventilation and photic stimulation were performed.  The digital EEG was referentially recorded, reformatted, and digitally filtered in a variety of bipolar and referential montages for optimal display.    Description: The patient is awake and drowsy during the recording.  During maximal wakefulness, there is a symmetric, medium voltage 8 Hz posterior dominant rhythm that attenuates with eye opening.  There is occasional focal 4-5 Hz theta slowing seen over the left temporal region. During drowsiness and sleep, there is an increase in theta slowing of the background.  Deeper stages of sleep were not seen. There were occasional 1-2 second bursts of generalized 4-5 Hz spike and wave discharges, at times with right-sided lead-in. There was one burst seen during hyperventilation. Photic stimulation did not elicit any abnormalities.  There were no electrographic seizures seen.    EKG lead was unremarkable.  Impression: This awake and drowsy EEG is abnormal due to the presence of: 1. Occasional focal slowing over the left temporal region 2. Occasional 1-2 second bursts of generalized 4-5 Hz spike and wave discharges, at times with right-sided  lead-in  Clinical Correlation of the above findings indicates focal cerebral dysfunction over the left temporal region suggestive of underlying structural or physiologic abnormality. 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 Clinical correlation is advised.   Ellouise Newer, M.D.

## 2015-11-26 ENCOUNTER — Ambulatory Visit: Payer: Medicare PPO | Admitting: Neurology

## 2015-11-26 DIAGNOSIS — IMO0001 Reserved for inherently not codable concepts without codable children: Secondary | ICD-10-CM

## 2015-11-30 ENCOUNTER — Other Ambulatory Visit: Payer: Self-pay | Admitting: Physical Medicine & Rehabilitation

## 2015-12-12 ENCOUNTER — Ambulatory Visit (INDEPENDENT_AMBULATORY_CARE_PROVIDER_SITE_OTHER): Payer: Medicare PPO | Admitting: Neurology

## 2015-12-12 ENCOUNTER — Encounter: Payer: Self-pay | Admitting: Neurology

## 2015-12-12 VITALS — BP 108/60 | HR 68

## 2015-12-12 DIAGNOSIS — G8114 Spastic hemiplegia affecting left nondominant side: Secondary | ICD-10-CM | POA: Diagnosis not present

## 2015-12-12 DIAGNOSIS — G40219 Localization-related (focal) (partial) symptomatic epilepsy and epileptic syndromes with complex partial seizures, intractable, without status epilepticus: Secondary | ICD-10-CM

## 2015-12-12 DIAGNOSIS — Z87820 Personal history of traumatic brain injury: Secondary | ICD-10-CM | POA: Diagnosis not present

## 2015-12-12 MED ORDER — KEPPRA 500 MG PO TABS: ORAL_TABLET | ORAL | Status: DC

## 2015-12-12 NOTE — Patient Instructions (Signed)
1. Start taking your Keppra 500mg  differently: Take 2 tablets in AM, 2 tablets in PM 2. As per [place] driving laws, no driving until 6 months seizure-free 3. Follow-up in 1 month  Seizure Precautions: 1. If medication has been prescribed for you to prevent seizures, take it exactly as directed.  Do not stop taking the medicine without talking to your doctor first, even if you have not had a seizure in a long time.   2. Avoid activities in which a seizure would cause danger to yourself or to others.  Don't operate dangerous machinery, swim alone, or climb in high or dangerous places, such as on ladders, roofs, or girders.  Do not drive unless your doctor says you may.  3. If you have any warning that you may have a seizure, lay down in a safe place where you can't hurt yourself.    4.  No driving for 6 months from last seizure, as per [facility].   Please refer to the following link on the [place] website for more information: http://www.epilepsyfoundation.org/answerplace/Social/driving/drivingu.cfm   5.  Maintain good sleep hygiene. Avoid alcohol.  6.  Contact your doctor if you have any problems that may be related to the medicine you are taking.  7.  Call 911 and bring the patient back to the ED if:        A.  The seizure lasts longer than 5 minutes.       B.  The patient doesn't awaken shortly after the seizure  C.  The patient has new problems such as difficulty seeing, speaking or moving  D.  The patient was injured during the seizure  E.  The patient has a temperature over 102 F (39C)  F.  The patient vomited and now is having trouble breathing
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
## 2015-12-15 ENCOUNTER — Telehealth: Payer: Self-pay | Admitting: Family Medicine

## 2015-12-15 NOTE — Telephone Encounter (Signed)
She has only been on higher dose for 3 days, let's wait one more week and see how she is doing once body gets more used to higher dose. Let's do PA. Thanks

## 2015-12-15 NOTE — Telephone Encounter (Signed)
I spoke with patient and she states since starting the increase she has been sleeping a lot and feels like she's "floating" on a cloud. She also stated that she has been on brand & her ins has covered. I assume they approved through PA.

## 2015-12-15 NOTE — Telephone Encounter (Signed)
I spoke with patient to let her know that we would work on PA to get medication Brand approved. Also gave advisement about staying on the increase dose longer.

## 2015-12-17 ENCOUNTER — Telehealth: Payer: Self-pay | Admitting: Neurology

## 2015-12-17 ENCOUNTER — Encounter: Payer: Self-pay | Admitting: Neurology

## 2015-12-17 DIAGNOSIS — G40219 Localization-related (focal) (partial) symptomatic epilepsy and epileptic syndromes with complex partial seizures, intractable, without status epilepticus: Secondary | ICD-10-CM | POA: Insufficient documentation

## 2015-12-17 NOTE — Telephone Encounter (Signed)
VM-PT left message in regards to her medication Keppra/Dawn CB# [phone]

## 2015-12-17 NOTE — Telephone Encounter (Signed)
Returned patients call. She was checking on the status of getting brand named Keppra approved through her ins. Also still c/o of being tired with recent increase of the Keppra of 2 tablets in the morning & 2 tablets in the evening. Dr. Delice Lesch wants her to continue with increased dose for now as it can take up to approx 3 weeks to get regulated in her system. She wants her to switch to taking 1 tablet in the morning & 3 tablets at night to see if this helps with her daytime sleepiness. Patient verbalized good understanding and will start with the new directions on tomorrow. PA forms faxed to [facility] with expedited processing.

## 2015-12-18 ENCOUNTER — Telehealth: Payer: Self-pay | Admitting: Family Medicine

## 2015-12-18 NOTE — Telephone Encounter (Signed)
Called patient to let her know that Brand only Keppra was approved through her ins. Rx is ready at the phramacy.

## 2015-12-22 ENCOUNTER — Telehealth: Payer: Self-pay | Admitting: Neurology

## 2015-12-22 NOTE — Telephone Encounter (Signed)
PT called and wanted a call back/Dawn [phone]

## 2015-12-23 NOTE — Telephone Encounter (Signed)
I spoke with patient. She wanted to clarify when her next f/u appt was. She is scheduled for March 31st.

## 2016-01-01 ENCOUNTER — Other Ambulatory Visit: Payer: Self-pay | Admitting: Urology

## 2016-01-02 ENCOUNTER — Ambulatory Visit: Payer: Self-pay | Admitting: Neurology

## 2016-01-16 ENCOUNTER — Encounter: Payer: Self-pay | Admitting: Neurology

## 2016-01-16 ENCOUNTER — Ambulatory Visit (INDEPENDENT_AMBULATORY_CARE_PROVIDER_SITE_OTHER): Payer: Medicare PPO | Admitting: Neurology

## 2016-01-16 VITALS — BP 122/74 | HR 80 | Resp 16

## 2016-01-16 DIAGNOSIS — G8114 Spastic hemiplegia affecting left nondominant side: Secondary | ICD-10-CM

## 2016-01-16 DIAGNOSIS — G40219 Localization-related (focal) (partial) symptomatic epilepsy and epileptic syndromes with complex partial seizures, intractable, without status epilepticus: Secondary | ICD-10-CM | POA: Diagnosis not present

## 2016-01-16 DIAGNOSIS — Z87820 Personal history of traumatic brain injury: Secondary | ICD-10-CM

## 2016-01-16 MED ORDER — LAMOTRIGINE 25 MG PO TABS: ORAL_TABLET | ORAL | Status: DC

## 2016-01-16 NOTE — Patient Instructions (Signed)
1. Start Lamotrigine 25mg : take 1 tablet twice a day for 2 weeks, then increase to 2 tablets twice a day 2. Continue Keppra 500mg  1 tab in AM, 3 tabs in PM for now. After 2 weeks of starting Lamotrigine, reduce Keppra back to 1 tablet twice a day 3. Keep a log of your seizures 4. Follow-up in 1 month, call for any problems  Seizure Precautions: 1. If medication has been prescribed for you to prevent seizures, take it exactly as directed.  Do not stop taking the medicine without talking to your doctor first, even if you have not had a seizure in a long time.   2. Avoid activities in which a seizure would cause danger to yourself or to others.  Don't operate dangerous machinery, swim alone, or climb in high or dangerous places, such as on ladders, roofs, or girders.  Do not drive unless your doctor says you may.  3. If you have any warning that you may have a seizure, lay down in a safe place where you can't hurt yourself.    4.  No driving for 6 months from last seizure, as per [facility]   Please refer to the following link on the [place] website for more information: http://[email or web address]/[email or web address]/driving/drivingu.cfm   5.  Maintain good sleep hygiene. Avoid alcohol.  6.  Contact your doctor if you have any problems that may be related to the medicine you are taking.  7.  Call 911 and bring the patient back to the ED if:        A.  The seizure lasts longer than 5 minutes.       B.  The patient doesn't awaken shortly after the seizure  C.  The patient has new problems such as difficulty seeing, speaking or moving  D.  The patient was injured during the seizure  E.  The patient has a temperature over 102 F (39C)  F.  The patient vomited and now is having trouble breathing
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
## 2016-01-27 ENCOUNTER — Other Ambulatory Visit: Payer: Self-pay | Admitting: Physical Medicine & Rehabilitation

## 2016-02-02 ENCOUNTER — Telehealth: Payer: Self-pay | Admitting: Neurology

## 2016-02-02 NOTE — Telephone Encounter (Signed)
Please review

## 2016-02-02 NOTE — Telephone Encounter (Signed)
[employer] [birth year]. Her sister (lisa) called [id number]. Her sister has had kidney stones and she is on a few different medications for that. She also was  In a transition from one of her meds from Dr. Delice Lesch that she was dropping an old one and starting a new one. She has been very drowsy and "loopy". The sister said she is unsure if it's because of all the meds she's on or the new medication from Dr. Delice Lesch.. She does have an upcoming appointment with Dr. Delice Lesch on 02/11/16. The patient's number is [phone]. Please call if you have any questions. The sister just wanted to make you aware of how Kendra [name] was feeling. Thank you

## 2016-02-03 NOTE — Telephone Encounter (Signed)
I'm not sure what medications she is taking for the kidney stones, so it's hard to say which is causing this. From our standpoint, she was supposed to increase the Lamictal to 50mg  BID a few days ago, if she is on 50mg  BID, she can reduce Keppra to 500mg  BID, if this is contributing to drowsiness at all. F/u as scheduled. Thanks

## 2016-02-03 NOTE — Telephone Encounter (Signed)
Tried calling Kendra [name]'s number, no answer. Called her sister [name]. She has been started on medication for kidney stones: Flomax, Phenergan and another antiemetic (?Zofran), Keppra, and Lamotrigine. She had morphine injection Sat night in the ER. Slept for a long time until Sunday, had not eaten anything up since Friday due to vomiting. Only had a little food Sunday, not hungry. Sister came yesterday, doing better last night. Agreed with sister to continue on Lamotrigine 50mg  BID for now until f/u. Sister agreed and will convey to [place].

## 2016-02-03 NOTE — Telephone Encounter (Signed)
I spoke with the patient. She states that she has reduced the Keppra to 1 tablet twice a day. Her issue isn't with the [place] she is having trouble with the lamotrigine. She states since starting the lamotrigine she is "extremely drowsy & loopy". States she can hardly get out of bed, her speech is slowed down(more slurred).

## 2016-02-11 ENCOUNTER — Ambulatory Visit: Payer: Medicare PPO | Admitting: Neurology

## 2016-02-20 ENCOUNTER — Encounter: Payer: Self-pay | Admitting: Neurology

## 2016-02-20 ENCOUNTER — Telehealth: Payer: Self-pay | Admitting: *Deleted

## 2016-02-20 ENCOUNTER — Ambulatory Visit (INDEPENDENT_AMBULATORY_CARE_PROVIDER_SITE_OTHER): Payer: Medicare PPO | Admitting: Neurology

## 2016-02-20 VITALS — BP 114/82 | HR 79 | Resp 14

## 2016-02-20 DIAGNOSIS — R51 Headache: Secondary | ICD-10-CM | POA: Diagnosis not present

## 2016-02-20 DIAGNOSIS — G8114 Spastic hemiplegia affecting left nondominant side: Secondary | ICD-10-CM

## 2016-02-20 DIAGNOSIS — R519 Headache, unspecified: Secondary | ICD-10-CM

## 2016-02-20 DIAGNOSIS — G40219 Localization-related (focal) (partial) symptomatic epilepsy and epileptic syndromes with complex partial seizures, intractable, without status epilepticus: Secondary | ICD-10-CM

## 2016-02-20 MED ORDER — LAMOTRIGINE 100 MG PO TABS: 100.0000 mg | ORAL_TABLET | Freq: Two times a day (BID) | ORAL | Status: DC

## 2016-02-20 NOTE — Telephone Encounter (Signed)
1 
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
## 2016-02-20 NOTE — Telephone Encounter (Signed)
[facility] office for patient's lab results per Dr. Tomi Likens.  Office closed on Friday at 12:30.  Please call on Monday.  [phone]

## 2016-02-20 NOTE — Patient Instructions (Signed)
1.  Stop the Keppra 2.  We will increase to lamictal to 100mg  twice daily. 3.  We will have your recent labs faxed over to [place]. 4.  Have your eyes checked 5.  When you go outside or to the pool, where sunglasses 6.  Follow up in 3 months but call with any questions or concerns that may require adjustment in medication.

## 2016-02-24 NOTE — Telephone Encounter (Signed)
Called office. They will fax over labs.

## 2016-03-03 ENCOUNTER — Encounter: Payer: Medicare PPO | Attending: Physical Medicine & Rehabilitation | Admitting: Physical Medicine & Rehabilitation

## 2016-03-03 ENCOUNTER — Encounter: Payer: Self-pay | Admitting: Physical Medicine & Rehabilitation

## 2016-03-03 VITALS — BP 122/74 | HR 76 | Resp 16

## 2016-03-03 DIAGNOSIS — F329 Major depressive disorder, single episode, unspecified: Secondary | ICD-10-CM | POA: Insufficient documentation

## 2016-03-03 DIAGNOSIS — I951 Orthostatic hypotension: Secondary | ICD-10-CM | POA: Insufficient documentation

## 2016-03-03 DIAGNOSIS — K219 Gastro-esophageal reflux disease without esophagitis: Secondary | ICD-10-CM | POA: Insufficient documentation

## 2016-03-03 DIAGNOSIS — M545 Low back pain: Secondary | ICD-10-CM | POA: Insufficient documentation

## 2016-03-03 DIAGNOSIS — C679 Malignant neoplasm of bladder, unspecified: Secondary | ICD-10-CM | POA: Insufficient documentation

## 2016-03-03 DIAGNOSIS — Z87442 Personal history of urinary calculi: Secondary | ICD-10-CM | POA: Diagnosis not present

## 2016-03-03 DIAGNOSIS — Z79899 Other long term (current) drug therapy: Secondary | ICD-10-CM | POA: Insufficient documentation

## 2016-03-03 DIAGNOSIS — G8929 Other chronic pain: Secondary | ICD-10-CM | POA: Insufficient documentation

## 2016-03-03 DIAGNOSIS — Z9049 Acquired absence of other specified parts of digestive tract: Secondary | ICD-10-CM | POA: Diagnosis not present

## 2016-03-03 DIAGNOSIS — M170 Bilateral primary osteoarthritis of knee: Secondary | ICD-10-CM | POA: Diagnosis not present

## 2016-03-03 DIAGNOSIS — M961 Postlaminectomy syndrome, not elsewhere classified: Secondary | ICD-10-CM | POA: Insufficient documentation

## 2016-03-03 DIAGNOSIS — F419 Anxiety disorder, unspecified: Secondary | ICD-10-CM | POA: Diagnosis not present

## 2016-03-03 DIAGNOSIS — Z87820 Personal history of traumatic brain injury: Secondary | ICD-10-CM | POA: Insufficient documentation

## 2016-03-03 DIAGNOSIS — K589 Irritable bowel syndrome without diarrhea: Secondary | ICD-10-CM | POA: Insufficient documentation

## 2016-03-03 DIAGNOSIS — I10 Essential (primary) hypertension: Secondary | ICD-10-CM | POA: Insufficient documentation

## 2016-03-03 DIAGNOSIS — G40909 Epilepsy, unspecified, not intractable, without status epilepticus: Secondary | ICD-10-CM | POA: Insufficient documentation

## 2016-03-03 DIAGNOSIS — R51 Headache: Secondary | ICD-10-CM | POA: Diagnosis not present

## 2016-03-03 DIAGNOSIS — M7061 Trochanteric bursitis, right hip: Secondary | ICD-10-CM | POA: Insufficient documentation
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
## 2016-03-03 NOTE — Patient Instructions (Signed)
  PLEASE CALL ME WITH ANY PROBLEMS OR QUESTIONS (#[phone]).      

## 2016-03-08 ENCOUNTER — Telehealth: Payer: Self-pay | Admitting: *Deleted

## 2016-03-08 NOTE — Telephone Encounter (Signed)
ICE 30 minutes TID, compression. She received injections in BOTH knees. Both knees were very tender prior to injections. Unfortunately, giving the injections was no guarantee to a sudden decrease in knee pain. Any pain from the injection itself should subside over the next few days

## 2016-03-08 NOTE — Telephone Encounter (Signed)
Patient left message.Kendra [name]...was given a knee injection during her visit last visit.  Pt still experiencing a lot of pain and having lots of trouble with transfers.  Pt is asking if this is normal? Is there anything else that can be done or the patient can do to alleviate pain? Last clinic visit note suggested that injection was difficult as the patient has significant OA in the knee.Kendra KitchenMarland KitchenMarland KitchenPlease advise

## 2016-03-09 NOTE — Telephone Encounter (Signed)
Pt advised.

## 2016-03-10 ENCOUNTER — Telehealth: Payer: Self-pay | Admitting: *Deleted

## 2016-03-10 NOTE — Telephone Encounter (Signed)
Physical therapist saw patient at home. Asked politely for a call back to get information on the type of injection patient received from Dr. Naaman Plummer. As reported in previous phone call, the patient experienced more than usual pain following the injection.  I called the PT back and informed that the injection was Synvisc and that Dr. Naaman Plummer note indicated that the pt's osteoarthritis in the knee obstructed the needle and made it difficult to get it into the joint cavity. I also informed Dr. Charm Barges advice to patient to help reduce pain

## 2016-03-17 ENCOUNTER — Telehealth: Payer: Self-pay | Admitting: *Deleted

## 2016-03-17 NOTE — Telephone Encounter (Signed)
Pt's sister called and left a message stating that patient is still having more than usual pain in her knees.  Pt complains of swelling, warm to the touch and red streaking.  Sister did not notice red streaking. She is having falling episodes.  Knees are giving out (buckling). Her next scheduled appt isn't until August...Marland KitchenMarland KitchenPlease advise

## 2016-03-19 NOTE — Telephone Encounter (Signed)
May need to re-start on something for pain. ?low dose norco (1/2 of a 5/325).  She has intolerances of numerous other meds.  Also need to keep regular ice on knees  Can be seen in the office sooner if needed.  She should not be transferring by herself---those days have passed. She is too high a fall risk.

## 2016-03-25 NOTE — Telephone Encounter (Signed)
Returned call to [facility]. Advised her to call back before 2:00 pm today.

## 2016-03-31 MED ORDER — HYDROCODONE-ACETAMINOPHEN 5-325 MG PO TABS: 0.5000 | ORAL_TABLET | Freq: Two times a day (BID) | ORAL | Status: DC | PRN

## 2016-03-31 NOTE — Telephone Encounter (Signed)
Called pt and notified. She said Arnette Norris would come by to pick it up

## 2016-03-31 NOTE — Telephone Encounter (Signed)
She can take at night. She will need a written rx of course for the medication. i have written out the rx if she would like to pick it up.

## 2016-03-31 NOTE — Telephone Encounter (Signed)
Please call patient and see if she would like to see Naaman Plummer this afternoon.

## 2016-03-31 NOTE — Telephone Encounter (Signed)
[name] can't bring patient in today, but would like to try the norco and wanted to know if Kemira could take it at night.  She also has not been doing the ice regularly and I explained to her that she needed to be.

## 2016-05-05 ENCOUNTER — Telehealth: Payer: Self-pay | Admitting: Physical Medicine & Rehabilitation

## 2016-05-05 NOTE — Telephone Encounter (Signed)
Patient has called in requesting that her records be sent to her, so that she may set up an appt and see an ortho doctor. Please call patient with any questions
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
## 2016-05-24 ENCOUNTER — Encounter: Payer: Self-pay | Admitting: Neurology

## 2016-05-24 ENCOUNTER — Ambulatory Visit (INDEPENDENT_AMBULATORY_CARE_PROVIDER_SITE_OTHER): Payer: Medicare PPO | Admitting: Neurology

## 2016-05-24 VITALS — BP 90/76 | HR 71

## 2016-05-24 DIAGNOSIS — G8114 Spastic hemiplegia affecting left nondominant side: Secondary | ICD-10-CM | POA: Diagnosis not present

## 2016-05-24 DIAGNOSIS — G40219 Localization-related (focal) (partial) symptomatic epilepsy and epileptic syndromes with complex partial seizures, intractable, without status epilepticus: Secondary | ICD-10-CM
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
## 2016-06-02 ENCOUNTER — Encounter: Payer: Medicare PPO | Admitting: Physical Medicine & Rehabilitation

## 2016-06-09 ENCOUNTER — Other Ambulatory Visit: Payer: Self-pay | Admitting: Neurology

## 2016-06-09 NOTE — Telephone Encounter (Signed)
    1. Reduce Lamotrigine 100mg : Take 1/2 tablet twice a day

## 2016-08-10 NOTE — Telephone Encounter (Signed)
error 

## 2016-08-30 ENCOUNTER — Encounter: Payer: Self-pay | Admitting: Neurology

## 2016-08-30 ENCOUNTER — Ambulatory Visit (INDEPENDENT_AMBULATORY_CARE_PROVIDER_SITE_OTHER): Payer: Medicare PPO | Admitting: Neurology

## 2016-08-30 VITALS — BP 118/72 | HR 79 | Ht 64.0 in

## 2016-08-30 DIAGNOSIS — G40109 Localization-related (focal) (partial) symptomatic epilepsy and epileptic syndromes with simple partial seizures, not intractable, without status epilepticus: Secondary | ICD-10-CM | POA: Diagnosis not present

## 2016-08-30 MED ORDER — LAMOTRIGINE 100 MG PO TABS: ORAL_TABLET | ORAL | 5 refills | Status: DC
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
## 2016-10-18 HISTORY — PX: BREAST SURGERY: SHX581

## 2016-12-13 ENCOUNTER — Ambulatory Visit (INDEPENDENT_AMBULATORY_CARE_PROVIDER_SITE_OTHER): Payer: Medicare PPO | Admitting: Neurology

## 2016-12-13 ENCOUNTER — Other Ambulatory Visit: Payer: Medicare PPO

## 2016-12-13 ENCOUNTER — Encounter: Payer: Self-pay | Admitting: Neurology

## 2016-12-13 VITALS — BP 102/68 | HR 72 | Temp 98.5°F | Resp 16

## 2016-12-13 DIAGNOSIS — G40109 Localization-related (focal) (partial) symptomatic epilepsy and epileptic syndromes with simple partial seizures, not intractable, without status epilepticus: Secondary | ICD-10-CM

## 2016-12-13 DIAGNOSIS — R413 Other amnesia: Secondary | ICD-10-CM

## 2016-12-13 DIAGNOSIS — G8114 Spastic hemiplegia affecting left nondominant side: Secondary | ICD-10-CM

## 2016-12-13 DIAGNOSIS — R296 Repeated falls: Secondary | ICD-10-CM

## 2016-12-13 LAB — VITAMIN B12: Vitamin B-12: 778 pg/mL (ref 200–1100)

## 2016-12-13 LAB — TSH: TSH: 1.84 m[IU]/L
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
## 2016-12-13 NOTE — Patient Instructions (Signed)
1. Continue with tapering off of clonazepam 2. Bloodwork for TSH, B12 3. Continue Lamictal 100mg : Take 1/2 tab in AM, 1 tab in PM 4. I will speak to our social worker about getting more help at home 5. Follow-up in 4 months, call for any changes

## 2016-12-14 NOTE — Progress Notes (Signed)
Faxed office notes to PCP - Dr. Greig Right.

## 2016-12-16 ENCOUNTER — Telehealth: Payer: Self-pay

## 2016-12-16 NOTE — Telephone Encounter (Signed)
-----   Message from Cameron Sprang, MD sent at 12/16/2016  9:58 AM EST ----- Pls let her know thyroid and B12 levels are normal. Pls also let her know I spoke to our social worker and for now she does not have any new suggestions. Does she want to try another Rehab specialist? We can see if Dr. Zonia Kief can see her ([facility] [address], [place], [place], [place] ). Thanks!

## 2016-12-16 NOTE — Telephone Encounter (Signed)
Clld pt - LMOVMTC re lab results. 

## 2016-12-17 ENCOUNTER — Telehealth: Payer: Self-pay | Admitting: Neurology

## 2016-12-17 NOTE — Telephone Encounter (Signed)
PT called and wanted to know if her lab results were ready yet/Dawn CB# [phone]

## 2016-12-17 NOTE — Telephone Encounter (Signed)
Returned pt's cll - advds of lab results; provider notation. Pt stated she understood.  Pt stated due to her niece(?) having a job now, she no longer has transportation to see Dr. Anna Genre.   Advsd pt I would forward message to provider to see if there are any other options.

## 2017-01-21 ENCOUNTER — Telehealth: Payer: Self-pay | Admitting: Neurology

## 2017-01-21 NOTE — Telephone Encounter (Signed)
Patient called and states that she is having some reactions to medication please call

## 2017-01-25 NOTE — Telephone Encounter (Signed)
Clld pt - advsd of provider's notations. Pt stated she understood and would let her sister know as well.

## 2017-01-25 NOTE — Telephone Encounter (Signed)
Returned pt cll - Pt stated she was not doing good with the tapering off of the Clonazepam (Klonopin). She stated that she was advsd by the provider to let her know if she began having issues while tapering off.   Pt states she's only taking a 1/2 tablet at night now - having a lot of shaking and cramps.   Advsd I would forward message to provider.

## 2017-01-25 NOTE — Telephone Encounter (Signed)
She can go back to how she was taking it prior to the taper. Her niece was very concerned about the clonazepam, but if she is having difficulties, would go back to what she was previously taking. THanks

## 2017-03-08 ENCOUNTER — Encounter: Payer: Medicare PPO | Attending: Physical Medicine & Rehabilitation | Admitting: Physical Medicine & Rehabilitation

## 2017-03-08 ENCOUNTER — Encounter: Payer: Self-pay | Admitting: Physical Medicine & Rehabilitation

## 2017-03-08 VITALS — BP 110/68 | HR 79 | Resp 14

## 2017-03-08 DIAGNOSIS — M7061 Trochanteric bursitis, right hip: Secondary | ICD-10-CM | POA: Diagnosis not present

## 2017-03-08 DIAGNOSIS — C679 Malignant neoplasm of bladder, unspecified: Secondary | ICD-10-CM | POA: Diagnosis not present

## 2017-03-08 DIAGNOSIS — Z833 Family history of diabetes mellitus: Secondary | ICD-10-CM | POA: Diagnosis not present

## 2017-03-08 DIAGNOSIS — M19019 Primary osteoarthritis, unspecified shoulder: Secondary | ICD-10-CM

## 2017-03-08 DIAGNOSIS — Z87442 Personal history of urinary calculi: Secondary | ICD-10-CM | POA: Insufficient documentation

## 2017-03-08 DIAGNOSIS — F419 Anxiety disorder, unspecified: Secondary | ICD-10-CM | POA: Insufficient documentation

## 2017-03-08 DIAGNOSIS — Z809 Family history of malignant neoplasm, unspecified: Secondary | ICD-10-CM | POA: Insufficient documentation

## 2017-03-08 DIAGNOSIS — M545 Low back pain: Secondary | ICD-10-CM | POA: Diagnosis not present

## 2017-03-08 DIAGNOSIS — G8114 Spastic hemiplegia affecting left nondominant side: Secondary | ICD-10-CM

## 2017-03-08 DIAGNOSIS — M961 Postlaminectomy syndrome, not elsewhere classified: Secondary | ICD-10-CM

## 2017-03-08 DIAGNOSIS — Z9049 Acquired absence of other specified parts of digestive tract: Secondary | ICD-10-CM | POA: Diagnosis not present

## 2017-03-08 DIAGNOSIS — F329 Major depressive disorder, single episode, unspecified: Secondary | ICD-10-CM | POA: Insufficient documentation

## 2017-03-08 DIAGNOSIS — K219 Gastro-esophageal reflux disease without esophagitis: Secondary | ICD-10-CM | POA: Diagnosis not present

## 2017-03-08 DIAGNOSIS — G40909 Epilepsy, unspecified, not intractable, without status epilepticus: Secondary | ICD-10-CM | POA: Diagnosis not present

## 2017-03-08 DIAGNOSIS — Z9889 Other specified postprocedural states: Secondary | ICD-10-CM | POA: Insufficient documentation

## 2017-03-08 DIAGNOSIS — I10 Essential (primary) hypertension: Secondary | ICD-10-CM | POA: Diagnosis not present

## 2017-03-08 DIAGNOSIS — K589 Irritable bowel syndrome without diarrhea: Secondary | ICD-10-CM | POA: Insufficient documentation

## 2017-03-08 DIAGNOSIS — Z5189 Encounter for other specified aftercare: Secondary | ICD-10-CM | POA: Insufficient documentation

## 2017-03-08 DIAGNOSIS — I951 Orthostatic hypotension: Secondary | ICD-10-CM | POA: Diagnosis not present

## 2017-03-08 DIAGNOSIS — M170 Bilateral primary osteoarthritis of knee: Secondary | ICD-10-CM | POA: Insufficient documentation

## 2017-03-08 DIAGNOSIS — Z87820 Personal history of traumatic brain injury: Secondary | ICD-10-CM | POA: Insufficient documentation
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
## 2017-03-08 NOTE — Patient Instructions (Signed)
PLEASE FEEL FREE TO CALL OUR OFFICE WITH ANY PROBLEMS OR QUESTIONS ([phone])      

## 2017-03-15 ENCOUNTER — Other Ambulatory Visit: Payer: Self-pay | Admitting: Neurology

## 2017-04-12 ENCOUNTER — Ambulatory Visit (INDEPENDENT_AMBULATORY_CARE_PROVIDER_SITE_OTHER): Payer: Medicare PPO | Admitting: Neurology

## 2017-04-12 ENCOUNTER — Encounter: Payer: Self-pay | Admitting: Neurology

## 2017-04-12 VITALS — BP 100/58 | HR 66 | Ht 67.0 in | Wt 190.0 lb

## 2017-04-12 DIAGNOSIS — G8114 Spastic hemiplegia affecting left nondominant side: Secondary | ICD-10-CM | POA: Diagnosis not present

## 2017-04-12 DIAGNOSIS — G40109 Localization-related (focal) (partial) symptomatic epilepsy and epileptic syndromes with simple partial seizures, not intractable, without status epilepticus: Secondary | ICD-10-CM | POA: Diagnosis not present

## 2017-04-12 MED ORDER — LAMOTRIGINE 100 MG PO TABS: ORAL_TABLET | ORAL | 11 refills | Status: DC
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
## 2017-05-02 ENCOUNTER — Ambulatory Visit: Payer: Medicare PPO | Admitting: Physical Medicine & Rehabilitation

## 2017-05-10 ENCOUNTER — Encounter: Payer: Medicare PPO | Attending: Physical Medicine & Rehabilitation | Admitting: Physical Medicine & Rehabilitation

## 2017-05-10 ENCOUNTER — Encounter: Payer: Self-pay | Admitting: Physical Medicine & Rehabilitation

## 2017-05-10 DIAGNOSIS — I10 Essential (primary) hypertension: Secondary | ICD-10-CM | POA: Insufficient documentation

## 2017-05-10 DIAGNOSIS — Z87442 Personal history of urinary calculi: Secondary | ICD-10-CM | POA: Diagnosis not present

## 2017-05-10 DIAGNOSIS — G8114 Spastic hemiplegia affecting left nondominant side: Secondary | ICD-10-CM | POA: Diagnosis not present

## 2017-05-10 DIAGNOSIS — G40909 Epilepsy, unspecified, not intractable, without status epilepticus: Secondary | ICD-10-CM | POA: Diagnosis not present

## 2017-05-10 DIAGNOSIS — I951 Orthostatic hypotension: Secondary | ICD-10-CM | POA: Insufficient documentation

## 2017-05-10 DIAGNOSIS — M7061 Trochanteric bursitis, right hip: Secondary | ICD-10-CM | POA: Insufficient documentation

## 2017-05-10 DIAGNOSIS — Z809 Family history of malignant neoplasm, unspecified: Secondary | ICD-10-CM | POA: Diagnosis not present

## 2017-05-10 DIAGNOSIS — Z9049 Acquired absence of other specified parts of digestive tract: Secondary | ICD-10-CM | POA: Diagnosis not present

## 2017-05-10 DIAGNOSIS — Z833 Family history of diabetes mellitus: Secondary | ICD-10-CM | POA: Diagnosis not present

## 2017-05-10 DIAGNOSIS — F419 Anxiety disorder, unspecified: Secondary | ICD-10-CM | POA: Diagnosis not present

## 2017-05-10 DIAGNOSIS — K589 Irritable bowel syndrome without diarrhea: Secondary | ICD-10-CM | POA: Diagnosis not present

## 2017-05-10 DIAGNOSIS — M545 Low back pain: Secondary | ICD-10-CM | POA: Insufficient documentation

## 2017-05-10 DIAGNOSIS — M961 Postlaminectomy syndrome, not elsewhere classified: Secondary | ICD-10-CM | POA: Diagnosis not present

## 2017-05-10 DIAGNOSIS — Z5189 Encounter for other specified aftercare: Secondary | ICD-10-CM | POA: Insufficient documentation

## 2017-05-10 DIAGNOSIS — C679 Malignant neoplasm of bladder, unspecified: Secondary | ICD-10-CM | POA: Diagnosis not present

## 2017-05-10 DIAGNOSIS — Z87820 Personal history of traumatic brain injury: Secondary | ICD-10-CM | POA: Diagnosis not present

## 2017-05-10 DIAGNOSIS — K219 Gastro-esophageal reflux disease without esophagitis: Secondary | ICD-10-CM | POA: Insufficient documentation

## 2017-05-10 DIAGNOSIS — M170 Bilateral primary osteoarthritis of knee: Secondary | ICD-10-CM | POA: Insufficient documentation

## 2017-05-10 DIAGNOSIS — Z9889 Other specified postprocedural states: Secondary | ICD-10-CM | POA: Diagnosis not present

## 2017-05-10 DIAGNOSIS — F329 Major depressive disorder, single episode, unspecified: Secondary | ICD-10-CM | POA: Diagnosis not present

## 2017-05-10 NOTE — Progress Notes (Signed)
Botox Injection for spasticity using needle EMG guidance Indication: Left spastic hemiparesis   Only 100u approved for injection today. I had intended to use 300 units for left triceps and deltoid. Will inject left triceps only as this is most symptomatic.    Dilution: 100 Units/ml        Total Units Injected: 100 Indication: Severe spasticity which interferes with ADL,mobility and/or  hygiene and is unresponsive to medication management and other conservative care Informed consent was obtained after describing risks and benefits of the procedure with the patient. This includes bleeding, bruising, infection, excessive weakness, or medication side effects. A REMS form is on file and signed.  Needle: 34mm injectable monopolar needle electrode  Number of units per muscle Pectoralis Major 0 units Pectoralis Minor 0 units Biceps 0 units Brachioradialis 0 units Triceps 100 units with 4 access points FCR 0 units FCU 0 units FDS 0 units FDP 0 units FPL 0 units Pronator Teres 0 units Pronator Quadratus 0 units  All injections were done after obtaining appropriate EMG activity and after negative drawback for blood. The patient tolerated the procedure well. Post procedure instructions were given. Return in about 2 months (around 07/11/2017).

## 2017-05-10 NOTE — Patient Instructions (Signed)
PLEASE FEEL FREE TO CALL OUR OFFICE WITH ANY PROBLEMS OR QUESTIONS ([phone])      

## 2017-07-11 ENCOUNTER — Encounter: Payer: Self-pay | Admitting: Physical Medicine & Rehabilitation

## 2017-07-11 ENCOUNTER — Encounter: Payer: Medicare PPO | Attending: Physical Medicine & Rehabilitation | Admitting: Physical Medicine & Rehabilitation

## 2017-07-11 VITALS — BP 187/95 | HR 82

## 2017-07-11 DIAGNOSIS — G8114 Spastic hemiplegia affecting left nondominant side: Secondary | ICD-10-CM

## 2017-07-11 DIAGNOSIS — K219 Gastro-esophageal reflux disease without esophagitis: Secondary | ICD-10-CM | POA: Insufficient documentation

## 2017-07-11 DIAGNOSIS — I10 Essential (primary) hypertension: Secondary | ICD-10-CM | POA: Insufficient documentation

## 2017-07-11 DIAGNOSIS — I951 Orthostatic hypotension: Secondary | ICD-10-CM | POA: Insufficient documentation

## 2017-07-11 DIAGNOSIS — Z809 Family history of malignant neoplasm, unspecified: Secondary | ICD-10-CM | POA: Insufficient documentation

## 2017-07-11 DIAGNOSIS — F419 Anxiety disorder, unspecified: Secondary | ICD-10-CM | POA: Diagnosis not present

## 2017-07-11 DIAGNOSIS — G40909 Epilepsy, unspecified, not intractable, without status epilepticus: Secondary | ICD-10-CM | POA: Diagnosis not present

## 2017-07-11 DIAGNOSIS — Z9889 Other specified postprocedural states: Secondary | ICD-10-CM | POA: Diagnosis not present

## 2017-07-11 DIAGNOSIS — Z833 Family history of diabetes mellitus: Secondary | ICD-10-CM | POA: Diagnosis not present

## 2017-07-11 DIAGNOSIS — M545 Low back pain: Secondary | ICD-10-CM | POA: Insufficient documentation

## 2017-07-11 DIAGNOSIS — C679 Malignant neoplasm of bladder, unspecified: Secondary | ICD-10-CM | POA: Insufficient documentation

## 2017-07-11 DIAGNOSIS — Z9049 Acquired absence of other specified parts of digestive tract: Secondary | ICD-10-CM | POA: Insufficient documentation

## 2017-07-11 DIAGNOSIS — M961 Postlaminectomy syndrome, not elsewhere classified: Secondary | ICD-10-CM | POA: Diagnosis not present

## 2017-07-11 DIAGNOSIS — K589 Irritable bowel syndrome without diarrhea: Secondary | ICD-10-CM | POA: Diagnosis not present

## 2017-07-11 DIAGNOSIS — Z87442 Personal history of urinary calculi: Secondary | ICD-10-CM | POA: Diagnosis not present

## 2017-07-11 DIAGNOSIS — Z87820 Personal history of traumatic brain injury: Secondary | ICD-10-CM

## 2017-07-11 DIAGNOSIS — M170 Bilateral primary osteoarthritis of knee: Secondary | ICD-10-CM | POA: Diagnosis not present

## 2017-07-11 DIAGNOSIS — Z5189 Encounter for other specified aftercare: Secondary | ICD-10-CM | POA: Insufficient documentation

## 2017-07-11 DIAGNOSIS — M7061 Trochanteric bursitis, right hip: Secondary | ICD-10-CM | POA: Diagnosis not present

## 2017-07-11 DIAGNOSIS — F329 Major depressive disorder, single episode, unspecified: Secondary | ICD-10-CM | POA: Diagnosis not present
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
## 2017-07-11 NOTE — Patient Instructions (Signed)
PLEASE FEEL FREE TO CALL OUR OFFICE WITH ANY PROBLEMS OR QUESTIONS ([phone])    WORK ON REGULAR SIT TO STANDING EXERCISES!!

## 2017-08-01 ENCOUNTER — Telehealth: Payer: Self-pay | Admitting: Physical Medicine & Rehabilitation

## 2017-08-01 NOTE — Telephone Encounter (Signed)
Kendra [name] was not given botox at last visit because she was not due for botox as it was only 2 months from the injection. We scheduled the visit to see if she had a positive effect and to decide whether future injections were warranted. We decided to proceed with another injection at next visit.

## 2017-08-01 NOTE — Telephone Encounter (Signed)
I spoke with her sister [name] and confirmed the botox will be given on 08/09/17.

## 2017-08-01 NOTE — Telephone Encounter (Signed)
Pt's daughter stated she wants to make sure the Pt is actually getting the Botox when she comes in.  She stated that when she came on the last visit for Botox, that Botox was not given. Please advise.

## 2017-08-09 ENCOUNTER — Encounter: Payer: Medicare PPO | Attending: Physical Medicine & Rehabilitation | Admitting: Physical Medicine & Rehabilitation

## 2017-08-09 ENCOUNTER — Encounter: Payer: Self-pay | Admitting: Physical Medicine & Rehabilitation

## 2017-08-09 VITALS — BP 135/70 | HR 73

## 2017-08-09 DIAGNOSIS — G40909 Epilepsy, unspecified, not intractable, without status epilepticus: Secondary | ICD-10-CM | POA: Diagnosis not present

## 2017-08-09 DIAGNOSIS — Z87442 Personal history of urinary calculi: Secondary | ICD-10-CM | POA: Diagnosis not present

## 2017-08-09 DIAGNOSIS — F419 Anxiety disorder, unspecified: Secondary | ICD-10-CM | POA: Insufficient documentation

## 2017-08-09 DIAGNOSIS — F329 Major depressive disorder, single episode, unspecified: Secondary | ICD-10-CM | POA: Diagnosis not present

## 2017-08-09 DIAGNOSIS — K589 Irritable bowel syndrome without diarrhea: Secondary | ICD-10-CM | POA: Insufficient documentation

## 2017-08-09 DIAGNOSIS — Z809 Family history of malignant neoplasm, unspecified: Secondary | ICD-10-CM | POA: Diagnosis not present

## 2017-08-09 DIAGNOSIS — I10 Essential (primary) hypertension: Secondary | ICD-10-CM | POA: Insufficient documentation

## 2017-08-09 DIAGNOSIS — M7061 Trochanteric bursitis, right hip: Secondary | ICD-10-CM | POA: Insufficient documentation

## 2017-08-09 DIAGNOSIS — Z5189 Encounter for other specified aftercare: Secondary | ICD-10-CM | POA: Diagnosis not present

## 2017-08-09 DIAGNOSIS — Z9049 Acquired absence of other specified parts of digestive tract: Secondary | ICD-10-CM | POA: Diagnosis not present

## 2017-08-09 DIAGNOSIS — K219 Gastro-esophageal reflux disease without esophagitis: Secondary | ICD-10-CM | POA: Insufficient documentation

## 2017-08-09 DIAGNOSIS — I951 Orthostatic hypotension: Secondary | ICD-10-CM | POA: Diagnosis not present

## 2017-08-09 DIAGNOSIS — Z87820 Personal history of traumatic brain injury: Secondary | ICD-10-CM | POA: Insufficient documentation

## 2017-08-09 DIAGNOSIS — M961 Postlaminectomy syndrome, not elsewhere classified: Secondary | ICD-10-CM | POA: Insufficient documentation

## 2017-08-09 DIAGNOSIS — Z833 Family history of diabetes mellitus: Secondary | ICD-10-CM | POA: Diagnosis not present

## 2017-08-09 DIAGNOSIS — M545 Low back pain: Secondary | ICD-10-CM | POA: Insufficient documentation

## 2017-08-09 DIAGNOSIS — M170 Bilateral primary osteoarthritis of knee: Secondary | ICD-10-CM | POA: Insufficient documentation

## 2017-08-09 DIAGNOSIS — Z9889 Other specified postprocedural states: Secondary | ICD-10-CM | POA: Diagnosis not present

## 2017-08-09 DIAGNOSIS — C679 Malignant neoplasm of bladder, unspecified: Secondary | ICD-10-CM | POA: Diagnosis not present

## 2017-08-09 DIAGNOSIS — G8114 Spastic hemiplegia affecting left nondominant side: Secondary | ICD-10-CM | POA: Diagnosis not present

## 2017-08-09 NOTE — Progress Notes (Signed)
Botox Injection for spasticity using needle EMG guidance Indication: Left spastic hemiparesis   Dilution: 100 Units/ml        Total Units Injected: 200 Indication: Severe spasticity which interferes with ADL,mobility and/or  hygiene and is unresponsive to medication management and other conservative care Informed consent was obtained after describing risks and benefits of the procedure with the patient. This includes bleeding, bruising, infection, excessive weakness, or medication side effects. A REMS form is on file and signed.  Needle: 69mm injectable monopolar needle electrode  Number of units per muscle, Left upper extremity/trunk Pectoralis Major 0 units Pectoralis Minor 0 units Latissimus dorsi 25 units Teres major 25 units Deltoid 50 units, 2 access points Triceps  100 units, 4 access points FDS 0 units FDP 0 units FPL 0 units Pronator Teres 0 units Pronator Quadratus 0 units  All injections were done after obtaining appropriate EMG activity and after negative drawback for blood. The patient tolerated the procedure well. Post procedure instructions were given. Return in about 2 months (around 10/09/2017).

## 2017-08-09 NOTE — Patient Instructions (Signed)
PLEASE FEEL FREE TO CALL OUR OFFICE WITH ANY PROBLEMS OR QUESTIONS ([phone])      

## 2017-08-30 DIAGNOSIS — N6310 Unspecified lump in the right breast, unspecified quadrant: Secondary | ICD-10-CM | POA: Insufficient documentation

## 2017-10-03 DIAGNOSIS — Z1239 Encounter for other screening for malignant neoplasm of breast: Secondary | ICD-10-CM | POA: Insufficient documentation

## 2017-10-03 DIAGNOSIS — Z09 Encounter for follow-up examination after completed treatment for conditions other than malignant neoplasm: Secondary | ICD-10-CM | POA: Insufficient documentation

## 2017-11-01 ENCOUNTER — Ambulatory Visit (INDEPENDENT_AMBULATORY_CARE_PROVIDER_SITE_OTHER): Payer: Medicare PPO | Admitting: Neurology

## 2017-11-01 ENCOUNTER — Encounter: Payer: Self-pay | Admitting: Neurology

## 2017-11-01 VITALS — BP 124/82 | HR 72 | Ht 67.0 in | Wt 200.0 lb

## 2017-11-01 DIAGNOSIS — G40109 Localization-related (focal) (partial) symptomatic epilepsy and epileptic syndromes with simple partial seizures, not intractable, without status epilepticus: Secondary | ICD-10-CM

## 2017-11-01 MED ORDER — LAMOTRIGINE 100 MG PO TABS: ORAL_TABLET | ORAL | 11 refills | Status: DC
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
## 2017-11-04 ENCOUNTER — Encounter: Payer: Self-pay | Admitting: Neurology

## 2018-02-28 ENCOUNTER — Telehealth: Payer: Self-pay | Admitting: Physical Medicine & Rehabilitation

## 2018-02-28 NOTE — Telephone Encounter (Signed)
Pt is requesting Botox in her left arm. Last OV was 07/2017. Please advise.

## 2018-03-01 ENCOUNTER — Telehealth: Payer: Self-pay | Admitting: Physical Medicine & Rehabilitation

## 2018-03-01 NOTE — Telephone Encounter (Signed)
Can arrange botox 300 u to left arm

## 2018-03-01 NOTE — Telephone Encounter (Signed)
ERROR

## 2018-03-06 ENCOUNTER — Ambulatory Visit: Payer: Medicare PPO | Admitting: Physical Medicine & Rehabilitation

## 2018-03-06 ENCOUNTER — Encounter: Payer: Medicare PPO | Admitting: Physical Medicine & Rehabilitation

## 2018-03-16 DIAGNOSIS — M5412 Radiculopathy, cervical region: Secondary | ICD-10-CM | POA: Insufficient documentation

## 2018-03-16 DIAGNOSIS — M25511 Pain in right shoulder: Secondary | ICD-10-CM | POA: Insufficient documentation

## 2018-03-31 ENCOUNTER — Other Ambulatory Visit (HOSPITAL_COMMUNITY): Payer: Self-pay | Admitting: Physician Assistant

## 2018-03-31 DIAGNOSIS — M5412 Radiculopathy, cervical region: Secondary | ICD-10-CM

## 2018-04-29 NOTE — Progress Notes (Signed)
Patient's sister requested that we obtain history DOS.

## 2018-05-01 NOTE — Progress Notes (Addendum)
I called [facility] and spoke with [facility] and informed her that the H/P is out of date and we need one that is within the last 30 days.  I received a call from Ms [facility] sister, who said patient has an appointment at Dr Loren Racer office today for a H/P and she will have office fax it to [place].  I have not received copy of H/P, I called [facility] to confirm that Ms Couts was seen today and I was told that she was seen. I was told that the office just faxed H/P to [place] and she also gave a copy to patient's sister.

## 2018-05-02 ENCOUNTER — Encounter (HOSPITAL_COMMUNITY): Payer: Self-pay | Admitting: *Deleted

## 2018-05-02 ENCOUNTER — Ambulatory Visit (HOSPITAL_COMMUNITY)
Admission: RE | Admit: 2018-05-02 | Discharge: 2018-05-02 | Disposition: A | Discharge status: Home / Self Care | Payer: Medicare PPO | Source: Ambulatory Visit | Attending: Neurosurgery | Admitting: Neurosurgery

## 2018-05-02 ENCOUNTER — Ambulatory Visit (HOSPITAL_COMMUNITY): Payer: Medicare PPO | Admitting: Certified Registered Nurse Anesthetist

## 2018-05-02 ENCOUNTER — Ambulatory Visit (HOSPITAL_COMMUNITY)
Admission: RE | Admit: 2018-05-02 | Discharge: 2018-05-02 | Disposition: A | Discharge status: Home / Self Care | Payer: Medicare PPO | Source: Ambulatory Visit | Attending: Physician Assistant | Admitting: Physician Assistant

## 2018-05-02 ENCOUNTER — Encounter (HOSPITAL_COMMUNITY)
Admission: RE | Disposition: A | Discharge status: Home / Self Care | Payer: Self-pay | Source: Ambulatory Visit | Attending: Neurosurgery

## 2018-05-02 DIAGNOSIS — R51 Headache: Secondary | ICD-10-CM | POA: Diagnosis not present

## 2018-05-02 DIAGNOSIS — Z87820 Personal history of traumatic brain injury: Secondary | ICD-10-CM | POA: Diagnosis not present

## 2018-05-02 DIAGNOSIS — I10 Essential (primary) hypertension: Secondary | ICD-10-CM | POA: Insufficient documentation

## 2018-05-02 DIAGNOSIS — R351 Nocturia: Secondary | ICD-10-CM | POA: Diagnosis not present

## 2018-05-02 DIAGNOSIS — M48061 Spinal stenosis, lumbar region without neurogenic claudication: Secondary | ICD-10-CM | POA: Diagnosis not present

## 2018-05-02 DIAGNOSIS — G8929 Other chronic pain: Secondary | ICD-10-CM | POA: Diagnosis not present

## 2018-05-02 DIAGNOSIS — Z87442 Personal history of urinary calculi: Secondary | ICD-10-CM | POA: Diagnosis not present

## 2018-05-02 DIAGNOSIS — K219 Gastro-esophageal reflux disease without esophagitis: Secondary | ICD-10-CM | POA: Insufficient documentation

## 2018-05-02 DIAGNOSIS — Z9181 History of falling: Secondary | ICD-10-CM | POA: Diagnosis not present

## 2018-05-02 DIAGNOSIS — M545 Low back pain: Secondary | ICD-10-CM | POA: Diagnosis not present

## 2018-05-02 DIAGNOSIS — K589 Irritable bowel syndrome without diarrhea: Secondary | ICD-10-CM | POA: Insufficient documentation

## 2018-05-02 DIAGNOSIS — Z6831 Body mass index (BMI) 31.0-31.9, adult: Secondary | ICD-10-CM | POA: Insufficient documentation

## 2018-05-02 DIAGNOSIS — Z79899 Other long term (current) drug therapy: Secondary | ICD-10-CM | POA: Insufficient documentation

## 2018-05-02 DIAGNOSIS — F419 Anxiety disorder, unspecified: Secondary | ICD-10-CM | POA: Insufficient documentation

## 2018-05-02 DIAGNOSIS — M1990 Unspecified osteoarthritis, unspecified site: Secondary | ICD-10-CM | POA: Insufficient documentation

## 2018-05-02 DIAGNOSIS — M25511 Pain in right shoulder: Secondary | ICD-10-CM | POA: Insufficient documentation

## 2018-05-02 DIAGNOSIS — E119 Type 2 diabetes mellitus without complications: Secondary | ICD-10-CM | POA: Diagnosis not present

## 2018-05-02 DIAGNOSIS — M5412 Radiculopathy, cervical region: Secondary | ICD-10-CM

## 2018-05-02 HISTORY — PX: RADIOLOGY WITH ANESTHESIA: SHX6223

## 2018-05-02 HISTORY — DX: Type 2 diabetes mellitus without complications: E11.9

## 2018-05-02 HISTORY — DX: Malignant (primary) neoplasm, unspecified: C80.1

## 2018-05-02 LAB — BASIC METABOLIC PANEL WITH GFR
Anion gap: 11 (ref 5–15)
BUN: 9 mg/dL (ref 6–20)
CO2: 26 mmol/L (ref 22–32)
Calcium: 9.2 mg/dL (ref 8.9–10.3)
Chloride: 105 mmol/L (ref 98–111)
Creatinine, Ser: 0.6 mg/dL (ref 0.44–1.00)
GFR calc Af Amer: >60 mL/min
GFR calc non Af Amer: >60 mL/min
Glucose, Bld: 135 mg/dL — ABNORMAL HIGH (ref 70–99)
Potassium: 3.3 mmol/L — ABNORMAL LOW (ref 3.5–5.1)
Sodium: 142 mmol/L (ref 135–145)

## 2018-05-02 LAB — CBC
HCT: 36.2 % (ref 36.0–46.0)
Hemoglobin: 11.3 g/dL — ABNORMAL LOW (ref 12.0–15.0)
MCH: 28 pg (ref 26.0–34.0)
MCHC: 31.2 g/dL (ref 30.0–36.0)
MCV: 89.6 fL (ref 78.0–100.0)
PLATELETS: 335 10*3/uL (ref 150–400)
RBC: 4.04 MIL/uL (ref 3.87–5.11)
RDW: 14.6 % (ref 11.5–15.5)
WBC: 9.4 10*3/uL (ref 4.0–10.5)

## 2018-05-02 LAB — GLUCOSE, CAPILLARY
GLUCOSE-CAPILLARY: 118 mg/dL — AB (ref 70–99)
Glucose-Capillary: 129 mg/dL — ABNORMAL HIGH (ref 70–99)
Glucose-Capillary: 117 mg/dL — ABNORMAL HIGH (ref 70–99)

## 2018-05-02 SURGERY — MRI WITH ANESTHESIA
Anesthesia: General

## 2018-05-02 MED ORDER — LACTATED RINGERS IV SOLN
INTRAVENOUS | Status: DC
  Administered 2018-05-02: 08:00:00 via INTRAVENOUS
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
## 2018-05-02 NOTE — Transfer of Care (Signed)
Immediate Anesthesia Transfer of Care Note  Patient: Kendra [name]  Procedure(s) Performed: MRI CERVICAL SPINE WITHOUT CONTRAST (N/A )  Patient Location: PACU  Anesthesia Type:General  Level of Consciousness: awake, alert , oriented and patient cooperative  Airway & Oxygen Therapy: Patient Spontanous Breathing and Patient connected to nasal cannula oxygen  Post-op Assessment: Report given to RN, Post -op Vital signs reviewed and stable and Patient moving all extremities X 4  Post vital signs: Reviewed and stable  Last Vitals:  Vitals Value Taken Time  BP 166/78 05/02/2018 11:33 AM  Temp    Pulse 72 05/02/2018 11:33 AM  Resp 17 05/02/2018 11:33 AM  SpO2 97 % 05/02/2018 11:33 AM  Vitals shown include unvalidated device data.  Last Pain:  Vitals:   05/02/18 0705  TempSrc: Oral         Complications: No apparent anesthesia complications

## 2018-05-03 ENCOUNTER — Encounter (HOSPITAL_COMMUNITY): Payer: Self-pay | Admitting: Radiology

## 2018-05-03 LAB — HEMOGLOBIN A1C
HEMOGLOBIN A1C: 6.3 % — AB (ref 4.8–5.6)
MEAN PLASMA GLUCOSE: 134 mg/dL

## 2018-05-05 NOTE — Anesthesia Postprocedure Evaluation (Signed)
Anesthesia Post Note  Patient: Kendra [name]  Procedure(s) Performed: MRI CERVICAL SPINE WITHOUT CONTRAST (N/A )     Patient location during evaluation: PACU Anesthesia Type: General Level of consciousness: awake and alert Pain management: pain level controlled Vital Signs Assessment: post-procedure vital signs reviewed and stable Respiratory status: spontaneous breathing, nonlabored ventilation, respiratory function stable and patient connected to nasal cannula oxygen Cardiovascular status: blood pressure returned to baseline and stable Postop Assessment: no apparent nausea or vomiting Anesthetic complications: no    Last Vitals:  Vitals:   05/02/18 1209 05/02/18 1220  BP: (!) 155/83   Pulse: 64   Resp: 16   Temp:    SpO2: 93% 94%    Last Pain:  Vitals:   05/02/18 0705  TempSrc: Oral                 Vega Withrow

## 2018-05-09 MED FILL — Rocuronium Bromide IV Soln 50 MG/5ML (10 MG/ML): INTRAVENOUS | Qty: 5 | Status: AC

## 2018-05-09 MED FILL — Dexamethasone Sodium Phosphate Inj 4 MG/ML: INTRAMUSCULAR | Qty: 1 | Status: AC

## 2018-05-09 MED FILL — Propofol IV Emul 200 MG/20ML (10 MG/ML): INTRAVENOUS | Qty: 20 | Status: AC

## 2018-05-09 MED FILL — Lactated Ringer's Solution: INTRAVENOUS | Qty: 1000 | Status: AC

## 2018-05-09 MED FILL — Fentanyl Citrate Preservative Free (PF) Inj 100 MCG/2ML: INTRAMUSCULAR | Qty: 2 | Status: AC

## 2018-05-09 MED FILL — Ondansetron HCl Inj 4 MG/2ML (2 MG/ML): INTRAMUSCULAR | Qty: 2 | Status: AC

## 2018-05-09 MED FILL — Sugammadex Sodium IV 200 MG/2ML (Base Equivalent): INTRAVENOUS | Qty: 2 | Status: AC

## 2018-05-15 ENCOUNTER — Other Ambulatory Visit: Payer: Self-pay | Admitting: Neurology

## 2018-05-15 DIAGNOSIS — G40109 Localization-related (focal) (partial) symptomatic epilepsy and epileptic syndromes with simple partial seizures, not intractable, without status epilepticus: Secondary | ICD-10-CM

## 2018-05-16 ENCOUNTER — Telehealth: Payer: Self-pay | Admitting: Neurology

## 2018-05-16 DIAGNOSIS — G40109 Localization-related (focal) (partial) symptomatic epilepsy and epileptic syndromes with simple partial seizures, not intractable, without status epilepticus: Secondary | ICD-10-CM

## 2018-05-16 MED ORDER — LAMOTRIGINE 100 MG PO TABS: ORAL_TABLET | ORAL | 0 refills | Status: AC

## 2018-05-16 NOTE — Telephone Encounter (Signed)
Rx Lamotrigine 100mg  #45 with no refills sent to [facility] on [address] in [place].

## 2018-05-16 NOTE — Telephone Encounter (Signed)
Patient niece is calling about medication refill.   *STAT* If patient is at the pharmacy, call can be transferred to refill team.  1.     Which medications need to be refilled? (please list name of each medication and dose if know) lamotrigine  2.     Which pharmacy/location (including street and city if local pharmacy) is medication to be sent to? [facility] [address] [place]   3.     Do they need a 30 or 90 day supply? Just a couple of weeks worth    She is taking  1 1/2 pills a day.  Patient is the middle of switching to the [place] mail order pharmacy and just needs about 2 weeks to get her thur until the mail order gets there.

## 2018-05-26 ENCOUNTER — Other Ambulatory Visit: Payer: Self-pay | Admitting: Neurosurgery

## 2018-06-08 ENCOUNTER — Encounter (HOSPITAL_COMMUNITY)
Admission: RE | Admit: 2018-06-08 | Discharge: 2018-06-08 | Disposition: A | Discharge status: Home / Self Care | Payer: Medicare PPO | Source: Ambulatory Visit | Attending: Neurosurgery | Admitting: Neurosurgery

## 2018-06-08 ENCOUNTER — Other Ambulatory Visit: Payer: Self-pay

## 2018-06-08 ENCOUNTER — Encounter (HOSPITAL_COMMUNITY): Payer: Self-pay

## 2018-06-08 DIAGNOSIS — E669 Obesity, unspecified: Secondary | ICD-10-CM | POA: Insufficient documentation

## 2018-06-08 DIAGNOSIS — Z79899 Other long term (current) drug therapy: Secondary | ICD-10-CM | POA: Insufficient documentation

## 2018-06-08 DIAGNOSIS — Z8551 Personal history of malignant neoplasm of bladder: Secondary | ICD-10-CM | POA: Diagnosis not present

## 2018-06-08 DIAGNOSIS — Z981 Arthrodesis status: Secondary | ICD-10-CM | POA: Diagnosis not present

## 2018-06-08 DIAGNOSIS — K219 Gastro-esophageal reflux disease without esophagitis: Secondary | ICD-10-CM | POA: Diagnosis not present

## 2018-06-08 DIAGNOSIS — E119 Type 2 diabetes mellitus without complications: Secondary | ICD-10-CM | POA: Diagnosis not present

## 2018-06-08 DIAGNOSIS — I10 Essential (primary) hypertension: Secondary | ICD-10-CM | POA: Diagnosis not present

## 2018-06-08 DIAGNOSIS — Z01812 Encounter for preprocedural laboratory examination: Secondary | ICD-10-CM | POA: Insufficient documentation

## 2018-06-08 HISTORY — DX: Dystonia, unspecified: G24.9

## 2018-06-08 HISTORY — DX: Myelopathy in diseases classified elsewhere: G99.2

## 2018-06-08 HISTORY — DX: Major depressive disorder, single episode, unspecified: F32.9

## 2018-06-08 HISTORY — DX: Depression, unspecified: F32.A

## 2018-06-08 HISTORY — DX: Anemia, unspecified: D64.9

## 2018-06-08 HISTORY — DX: Spinal stenosis, cervical region: M48.02

## 2018-06-08 LAB — BASIC METABOLIC PANEL
ANION GAP: 12 (ref 5–15)
BUN: 16 mg/dL (ref 6–20)
CALCIUM: 9.5 mg/dL (ref 8.9–10.3)
CO2: 26 mmol/L (ref 22–32)
Chloride: 102 mmol/L (ref 98–111)
Creatinine, Ser: 0.72 mg/dL (ref 0.44–1.00)
GFR calc Af Amer: >60 mL/min (ref >60)
GFR calc non Af Amer: >60 mL/min (ref >60)
Glucose, Bld: 172 mg/dL — ABNORMAL HIGH (ref 70–99)
Potassium: 3.4 mmol/L — ABNORMAL LOW (ref 3.5–5.1)
SODIUM: 140 mmol/L (ref 135–145)

## 2018-06-08 LAB — CBC
HEMATOCRIT: 40.8 % (ref 36.0–46.0)
Hemoglobin: 12.8 g/dL (ref 12.0–15.0)
MCH: 28.3 pg (ref 26.0–34.0)
MCHC: 31.4 g/dL (ref 30.0–36.0)
MCV: 90.3 fL (ref 78.0–100.0)
Platelets: 373 10*3/uL (ref 150–400)
RBC: 4.52 MIL/uL (ref 3.87–5.11)
RDW: 13.9 % (ref 11.5–15.5)
WBC: 8.7 10*3/uL (ref 4.0–10.5)

## 2018-06-08 LAB — TYPE AND SCREEN
ABO/RH(D): A POS
Antibody Screen: NEGATIVE

## 2018-06-08 LAB — SURGICAL PCR SCREEN
MRSA, PCR: NEGATIVE
STAPHYLOCOCCUS AUREUS: POSITIVE — AB

## 2018-06-08 LAB — GLUCOSE, CAPILLARY: Glucose-Capillary: 159 mg/dL — ABNORMAL HIGH (ref 70–99)

## 2018-06-08 NOTE — Progress Notes (Signed)
Pt denies SOB, chest pain, and being under the care of a cardiologist. Pt denies having a cardiac cath. Pt denies recent labs. 1 view chest x ray received from [facility] ( on chart) . Pt chart forwarded to anesthesia for review of EKG.

## 2018-06-08 NOTE — Pre-Procedure Instructions (Signed)
Kendra [name]  06/08/2018     [id number] - [place], [place] - [address] [place] [address] Phone: [phone] Fax: [facility] [place] [facility] [place], [place] [address] Phone: [phone] Fax: [phone]    Your procedure is scheduled on Thursday, June 15, 2018  Report to [facility] Admitting at 10:45 A.M.  Call this number if you have problems the morning of surgery:  [phone]   Remember:  Do not eat or drink after midnight Wednesday, June 14, 2018  Take these medicines the morning of surgery with A SIP OF WATER:lamoTRIgine (LAMICTAL), omeprazole (PRILOSEC), propranolol (INDERAL), sertraline (ZOLOFT), trihexyphenidyl (ARTANE)  If needed: loratadine (CLARITIN) for allergies, fluticasone (FLONASE) nasal spray for allergies  Stop taking vitamins, fish oil, CBD tablets for sleep, Melatonin, and herbal medications. Do not take any NSAIDs ie: Ibuprofen, Advil, Naproxen (Aleve), Motrin, diclofenac sodium (VOLTAREN), BC and Goody Powder; stop now.    How to Manage Your Diabetes Before and After Surgery  Why is it important to control my blood sugar before and after surgery? . Improving blood sugar levels before and after surgery helps healing and can limit problems. . A way of improving blood sugar control is eating a healthy diet by: o  Eating less sugar and carbohydrates o  Increasing activity/exercise o  Talking with your doctor about reaching your blood sugar goals . High blood sugars (greater than 180 mg/dL) can raise your risk of infections and slow your recovery, so you will need to focus on controlling your diabetes during the weeks before surgery. . Make sure that the doctor who takes care of your diabetes knows about your planned surgery including the date and location.  How do I manage my blood sugar before surgery? . Check  your blood sugar at least 4 times a day, starting 2 days before surgery, to make sure that the level is not too high or low. o Check your blood sugar the morning of your surgery when you wake up and every 2 hours until you get to the Short Stay unit. . If your blood sugar is less than 70 mg/dL, you will need to treat for low blood sugar: o Do not take insulin. o Treat a low blood sugar (less than 70 mg/dL) with  cup of clear juice (cranberry or apple), 4 glucose tablets, OR glucose gel. Recheck blood sugar in 15 minutes after treatment (to make sure it is greater than 70 mg/dL). If your blood sugar is not greater than 70 mg/dL on recheck, call [phone] o  for further instructions. . Report your blood sugar to the short stay nurse when you get to Short Stay.  . If you are admitted to the [name] after surgery: o Your blood sugar will be checked by the staff and you will probably be given insulin after surgery (instead of oral diabetes medicines) to make sure you have good blood sugar levels. o The goal for blood sugar control after surgery is 80-180 mg/dL  WHAT DO I DO ABOUT MY DIABETES MEDICATION?  [name] Do not take oral diabetes medicines (pills) the morning of surgery such as SitaGLIPtin-MetFORMIN HCl (JANUMET XR  Reviewed and Endorsed by [facility] Patient Education Committee, August 2015  Do not wear jewelry, make-up or nail polish.  Do not wear lotions, powders, or perfumes, or deodorant.  Do not shave 48  hours prior to surgery.   Do not bring valuables to the [name].  [facility] is not responsible for any belongings or valuables.  Contacts, dentures or bridgework may not be worn into surgery.  Leave your suitcase in the car.  After surgery it may be brought to your room. Patients discharged the day of surgery will not be allowed to drive home.  Special instructions: See " [place]- Preparing For Surgery Sheet ." Please read over the following fact sheets that you were  given. Pain Booklet, Coughing and Deep Breathing, MRSA Information and Surgical Site Infection Prevention

## 2018-06-09 ENCOUNTER — Other Ambulatory Visit: Payer: Self-pay | Admitting: Neurosurgery
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
## 2018-06-15 ENCOUNTER — Inpatient Hospital Stay (HOSPITAL_COMMUNITY)
Admission: AD | Admit: 2018-06-15 | Discharge: 2018-06-22 | DRG: 472 | Disposition: A | Discharge status: Skilled Nursing Facility | Payer: Medicare PPO | Source: Ambulatory Visit | Attending: Neurosurgery | Admitting: Neurosurgery

## 2018-06-15 ENCOUNTER — Ambulatory Visit (HOSPITAL_COMMUNITY): Payer: Medicare PPO | Admitting: Anesthesiology

## 2018-06-15 ENCOUNTER — Ambulatory Visit (HOSPITAL_COMMUNITY): Payer: Medicare PPO

## 2018-06-15 ENCOUNTER — Encounter (HOSPITAL_COMMUNITY): Payer: Self-pay | Admitting: Orthopedic Surgery

## 2018-06-15 ENCOUNTER — Ambulatory Visit (HOSPITAL_COMMUNITY): Payer: Medicare PPO | Admitting: Vascular Surgery

## 2018-06-15 ENCOUNTER — Encounter (HOSPITAL_COMMUNITY)
Admission: AD | Disposition: A | Discharge status: Skilled Nursing Facility | Payer: Self-pay | Source: Ambulatory Visit | Attending: Neurosurgery

## 2018-06-15 DIAGNOSIS — Y92230 Patient room in hospital as the place of occurrence of the external cause: Secondary | ICD-10-CM | POA: Diagnosis not present

## 2018-06-15 DIAGNOSIS — L8990 Pressure ulcer of unspecified site, unspecified stage: Secondary | ICD-10-CM | POA: Diagnosis present

## 2018-06-15 DIAGNOSIS — Z886 Allergy status to analgesic agent status: Secondary | ICD-10-CM

## 2018-06-15 DIAGNOSIS — G8114 Spastic hemiplegia affecting left nondominant side: Secondary | ICD-10-CM | POA: Diagnosis present

## 2018-06-15 DIAGNOSIS — Z930 Tracheostomy status: Secondary | ICD-10-CM

## 2018-06-15 DIAGNOSIS — K219 Gastro-esophageal reflux disease without esophagitis: Secondary | ICD-10-CM | POA: Diagnosis present

## 2018-06-15 DIAGNOSIS — Z981 Arthrodesis status: Secondary | ICD-10-CM

## 2018-06-15 DIAGNOSIS — Y834 Other reconstructive surgery as the cause of abnormal reaction of the patient, or of later complication, without mention of misadventure at the time of the procedure: Secondary | ICD-10-CM | POA: Diagnosis not present

## 2018-06-15 DIAGNOSIS — R1310 Dysphagia, unspecified: Secondary | ICD-10-CM | POA: Diagnosis not present

## 2018-06-15 DIAGNOSIS — M545 Low back pain: Secondary | ICD-10-CM | POA: Diagnosis present

## 2018-06-15 DIAGNOSIS — E119 Type 2 diabetes mellitus without complications: Secondary | ICD-10-CM | POA: Diagnosis present

## 2018-06-15 DIAGNOSIS — R633 Feeding difficulties: Secondary | ICD-10-CM | POA: Diagnosis present

## 2018-06-15 DIAGNOSIS — Z880 Allergy status to penicillin: Secondary | ICD-10-CM

## 2018-06-15 DIAGNOSIS — Z993 Dependence on wheelchair: Secondary | ICD-10-CM

## 2018-06-15 DIAGNOSIS — K589 Irritable bowel syndrome without diarrhea: Secondary | ICD-10-CM | POA: Diagnosis present

## 2018-06-15 DIAGNOSIS — I10 Essential (primary) hypertension: Secondary | ICD-10-CM | POA: Diagnosis present

## 2018-06-15 DIAGNOSIS — M4802 Spinal stenosis, cervical region: Principal | ICD-10-CM | POA: Diagnosis present

## 2018-06-15 DIAGNOSIS — F419 Anxiety disorder, unspecified: Secondary | ICD-10-CM | POA: Diagnosis present

## 2018-06-15 DIAGNOSIS — G249 Dystonia, unspecified: Secondary | ICD-10-CM | POA: Diagnosis not present

## 2018-06-15 DIAGNOSIS — Z7984 Long term (current) use of oral hypoglycemic drugs: Secondary | ICD-10-CM

## 2018-06-15 DIAGNOSIS — G8929 Other chronic pain: Secondary | ICD-10-CM | POA: Diagnosis present

## 2018-06-15 DIAGNOSIS — R51 Headache: Secondary | ICD-10-CM | POA: Diagnosis present

## 2018-06-15 DIAGNOSIS — Z751 Person awaiting admission to adequate facility elsewhere: Secondary | ICD-10-CM | POA: Diagnosis not present

## 2018-06-15 DIAGNOSIS — T8189XA Other complications of procedures, not elsewhere classified, initial encounter: Secondary | ICD-10-CM | POA: Diagnosis not present

## 2018-06-15 DIAGNOSIS — Z419 Encounter for procedure for purposes other than remedying health state, unspecified: Secondary | ICD-10-CM

## 2018-06-15 DIAGNOSIS — M1990 Unspecified osteoarthritis, unspecified site: Secondary | ICD-10-CM | POA: Diagnosis present

## 2018-06-15 DIAGNOSIS — F329 Major depressive disorder, single episode, unspecified: Secondary | ICD-10-CM | POA: Diagnosis present

## 2018-06-15 DIAGNOSIS — M4712 Other spondylosis with myelopathy, cervical region: Secondary | ICD-10-CM | POA: Diagnosis present

## 2018-06-15 DIAGNOSIS — Z888 Allergy status to other drugs, medicaments and biological substances status: Secondary | ICD-10-CM

## 2018-06-15 DIAGNOSIS — G992 Myelopathy in diseases classified elsewhere: Secondary | ICD-10-CM | POA: Diagnosis present

## 2018-06-15 DIAGNOSIS — Z791 Long term (current) use of non-steroidal anti-inflammatories (NSAID): Secondary | ICD-10-CM

## 2018-06-15 DIAGNOSIS — Z882 Allergy status to sulfonamides status: Secondary | ICD-10-CM

## 2018-06-15 DIAGNOSIS — M7989 Other specified soft tissue disorders: Secondary | ICD-10-CM | POA: Diagnosis not present

## 2018-06-15 DIAGNOSIS — Z23 Encounter for immunization: Secondary | ICD-10-CM | POA: Diagnosis present

## 2018-06-15 DIAGNOSIS — Z87820 Personal history of traumatic brain injury: Secondary | ICD-10-CM | POA: Diagnosis not present

## 2018-06-15 DIAGNOSIS — G40909 Epilepsy, unspecified, not intractable, without status epilepticus: Secondary | ICD-10-CM | POA: Diagnosis present

## 2018-06-15 DIAGNOSIS — Z79899 Other long term (current) drug therapy: Secondary | ICD-10-CM

## 2018-06-15 DIAGNOSIS — Z91040 Latex allergy status: Secondary | ICD-10-CM

## 2018-06-15 HISTORY — PX: ANTERIOR CERVICAL DECOMP/DISCECTOMY FUSION: SHX1161

## 2018-06-15 LAB — GLUCOSE, CAPILLARY
GLUCOSE-CAPILLARY: 121 mg/dL — AB (ref 70–99)
GLUCOSE-CAPILLARY: 110 mg/dL — AB (ref 70–99)
Glucose-Capillary: 105 mg/dL — ABNORMAL HIGH (ref 70–99)
Glucose-Capillary: 147 mg/dL — ABNORMAL HIGH (ref 70–99)

## 2018-06-15 SURGERY — ANTERIOR CERVICAL DECOMPRESSION/DISCECTOMY FUSION 2 LEVELS
Anesthesia: General | Site: Neck

## 2018-06-15 MED ORDER — FENTANYL CITRATE (PF) 100 MCG/2ML IJ SOLN
INTRAMUSCULAR | Status: AC
  Administered 2018-06-15: 25 ug via INTRAVENOUS
  Filled 2018-06-15: qty 2

## 2018-06-15 MED ORDER — LACTATED RINGERS IV SOLN
INTRAVENOUS | Status: DC
  Administered 2018-06-15 (×2): via INTRAVENOUS

## 2018-06-15 MED ORDER — 0.9 % SODIUM CHLORIDE (POUR BTL) OPTIME
TOPICAL | Status: DC | PRN
  Administered 2018-06-15: 1000 mL

## 2018-06-15 MED ORDER — DEXAMETHASONE SODIUM PHOSPHATE 10 MG/ML IJ SOLN
INTRAMUSCULAR | Status: DC | PRN
  Administered 2018-06-15: 10 mg via INTRAVENOUS

## 2018-06-15 MED ORDER — SUCCINYLCHOLINE CHLORIDE 20 MG/ML IJ SOLN
INTRAMUSCULAR | Status: DC | PRN
  Administered 2018-06-15: 120 mg via INTRAVENOUS

## 2018-06-15 MED ORDER — BUPIVACAINE HCL (PF) 0.5 % IJ SOLN
INTRAMUSCULAR | Status: AC
  Filled 2018-06-15: qty 30

## 2018-06-15 MED ORDER — MIDAZOLAM HCL 2 MG/2ML IJ SOLN
INTRAMUSCULAR | Status: AC
  Filled 2018-06-15: qty 2

## 2018-06-15 MED ORDER — VANCOMYCIN HCL IN DEXTROSE 1-5 GM/200ML-% IV SOLN
1000.0000 mg | INTRAVENOUS | Status: AC
  Administered 2018-06-15: 1000 mg via INTRAVENOUS

## 2018-06-15 MED ORDER — CHLORHEXIDINE GLUCONATE CLOTH 2 % EX PADS: 6.0000 | MEDICATED_PAD | Freq: Once | CUTANEOUS | Status: DC

## 2018-06-15 MED ORDER — FENTANYL CITRATE (PF) 250 MCG/5ML IJ SOLN
INTRAMUSCULAR | Status: AC
  Filled 2018-06-15: qty 5

## 2018-06-15 MED ORDER — ONDANSETRON HCL 4 MG/2ML IJ SOLN
INTRAMUSCULAR | Status: DC | PRN
  Administered 2018-06-15: 4 mg via INTRAVENOUS

## 2018-06-15 MED ORDER — FENTANYL CITRATE (PF) 100 MCG/2ML IJ SOLN
25.0000 ug | Freq: Once | INTRAMUSCULAR | Status: AC
  Administered 2018-06-15: 25 ug via INTRAVENOUS

## 2018-06-15 MED ORDER — FENTANYL CITRATE (PF) 100 MCG/2ML IJ SOLN
INTRAMUSCULAR | Status: DC | PRN
  Administered 2018-06-15: 150 ug via INTRAVENOUS
  Administered 2018-06-15: 100 ug via INTRAVENOUS

## 2018-06-15 MED ORDER — THROMBIN 5000 UNITS EX SOLR
CUTANEOUS | Status: AC
  Filled 2018-06-15: qty 5000

## 2018-06-15 MED ORDER — LIDOCAINE-EPINEPHRINE 1 %-1:100000 IJ SOLN
INTRAMUSCULAR | Status: DC | PRN
  Administered 2018-06-15: 4 mL via INTRADERMAL

## 2018-06-15 MED ORDER — MIDAZOLAM HCL 5 MG/5ML IJ SOLN
INTRAMUSCULAR | Status: DC | PRN
  Administered 2018-06-15: 2 mg via INTRAVENOUS

## 2018-06-15 MED ORDER — PHENYLEPHRINE HCL 10 MG/ML IJ SOLN
INTRAMUSCULAR | Status: DC | PRN
  Administered 2018-06-15 (×5): 80 ug via INTRAVENOUS

## 2018-06-15 MED ORDER — SODIUM CHLORIDE 0.9 % IV SOLN
INTRAVENOUS | Status: DC | PRN
  Administered 2018-06-15: 500 mL

## 2018-06-15 MED ORDER — THROMBIN (RECOMBINANT) 20000 UNITS EX SOLR
CUTANEOUS | Status: AC
  Filled 2018-06-15: qty 20000

## 2018-06-15 MED ORDER — PROPOFOL 10 MG/ML IV BOLUS
INTRAVENOUS | Status: DC | PRN
  Administered 2018-06-15: 120 mg via INTRAVENOUS

## 2018-06-15 MED ORDER — PROPOFOL 10 MG/ML IV BOLUS
INTRAVENOUS | Status: AC
  Filled 2018-06-15: qty 40

## 2018-06-15 MED ORDER — ROCURONIUM BROMIDE 100 MG/10ML IV SOLN
INTRAVENOUS | Status: DC | PRN
  Administered 2018-06-15: 50 mg via INTRAVENOUS

## 2018-06-15 MED ORDER — EPHEDRINE SULFATE 50 MG/ML IJ SOLN
INTRAMUSCULAR | Status: DC | PRN
  Administered 2018-06-15 (×2): 10 mg via INTRAVENOUS

## 2018-06-15 MED ORDER — LIDOCAINE HCL (CARDIAC) PF 100 MG/5ML IV SOSY
PREFILLED_SYRINGE | INTRAVENOUS | Status: DC | PRN
  Administered 2018-06-15: 100 mg via INTRAVENOUS

## 2018-06-15 MED ORDER — FENTANYL CITRATE (PF) 100 MCG/2ML IJ SOLN
25.0000 ug | INTRAMUSCULAR | Status: DC | PRN
  Administered 2018-06-15 (×4): 25 ug via INTRAVENOUS

## 2018-06-15 MED ORDER — LIDOCAINE-EPINEPHRINE 1 %-1:100000 IJ SOLN
INTRAMUSCULAR | Status: AC
  Filled 2018-06-15: qty 1

## 2018-06-15 MED ORDER — THROMBIN 20000 UNITS EX SOLR
CUTANEOUS | Status: DC | PRN
  Administered 2018-06-15: 20 mL via TOPICAL

## 2018-06-15 MED ORDER — FENTANYL CITRATE (PF) 100 MCG/2ML IJ SOLN
INTRAMUSCULAR | Status: AC
  Filled 2018-06-15: qty 2

## 2018-06-15 MED ORDER — THROMBIN 5000 UNITS EX SOLR
OROMUCOSAL | Status: DC | PRN
  Administered 2018-06-15: 8 mL via TOPICAL

## 2018-06-15 MED ORDER — VANCOMYCIN HCL IN DEXTROSE 1-5 GM/200ML-% IV SOLN
INTRAVENOUS | Status: AC
  Filled 2018-06-15: qty 200

## 2018-06-15 MED ORDER — SUGAMMADEX SODIUM 200 MG/2ML IV SOLN
INTRAVENOUS | Status: DC | PRN
  Administered 2018-06-15: 100 mg via INTRAVENOUS

## 2018-06-15 MED ORDER — BUPIVACAINE HCL 0.5 % IJ SOLN
INTRAMUSCULAR | Status: DC | PRN
  Administered 2018-06-15: 4 mL

## 2018-06-15 SURGICAL SUPPLY — 62 items
BAG DECANTER FOR FLEXI CONT (MISCELLANEOUS) ×3 IMPLANT
BENZOIN TINCTURE PRP APPL 2/3 (GAUZE/BANDAGES/DRESSINGS) IMPLANT
BLADE CLIPPER SURG (BLADE) IMPLANT
BLADE SURG 11 STRL SS (BLADE) ×3 IMPLANT
BNDG GAUZE ELAST 4 BULKY (GAUZE/BANDAGES/DRESSINGS) IMPLANT
BUR MATCHSTICK NEURO 3.0 LAGG (BURR) ×3 IMPLANT
CAGE EXPANSE 8X6X6 (Cage) ×6 IMPLANT
CAGE PEEK 5X14X11 (Cage) ×2 IMPLANT
CAGE PEEK 7X14X11 (Cage) ×2 IMPLANT
CAGE SPNL 11X14X5XRADOPQ (Cage) ×1 IMPLANT
CAGE SPNL 11X14X7XRADOPQ (Cage) ×1 IMPLANT
CANISTER SUCT 3000ML PPV (MISCELLANEOUS) ×3 IMPLANT
CARTRIDGE OIL MAESTRO DRILL (MISCELLANEOUS) ×1 IMPLANT
CLOSURE WOUND 1/2 X4 (GAUZE/BANDAGES/DRESSINGS)
DERMABOND ADVANCED (GAUZE/BANDAGES/DRESSINGS) ×2
DERMABOND ADVANCED .7 DNX12 (GAUZE/BANDAGES/DRESSINGS) ×1 IMPLANT
DIFFUSER DRILL AIR PNEUMATIC (MISCELLANEOUS) ×3 IMPLANT
DRAIN CHANNEL 10M FLAT 3/4 FLT (DRAIN) IMPLANT
DRAPE C-ARM 42X72 X-RAY (DRAPES) ×6 IMPLANT
DRAPE HALF SHEET 40X57 (DRAPES) IMPLANT
DRAPE LAPAROTOMY 100X72 PEDS (DRAPES) ×3 IMPLANT
DRAPE MICROSCOPE LEICA (MISCELLANEOUS) ×3 IMPLANT
DRSG OPSITE 4X5.5 SM (GAUZE/BANDAGES/DRESSINGS) ×6 IMPLANT
DRSG OPSITE POSTOP 3X4 (GAUZE/BANDAGES/DRESSINGS) ×3 IMPLANT
DURAPREP 6ML APPLICATOR 50/CS (WOUND CARE) ×3 IMPLANT
ELECT COATED BLADE 2.86 ST (ELECTRODE) ×3 IMPLANT
ELECT REM PT RETURN 9FT ADLT (ELECTROSURGICAL) ×3
ELECTRODE REM PT RTRN 9FT ADLT (ELECTROSURGICAL) ×1 IMPLANT
EVACUATOR SILICONE 100CC (DRAIN) IMPLANT
GAUZE 4X4 16PLY RFD (DISPOSABLE) IMPLANT
GLOVE BIOGEL PI IND STRL 7.5 (GLOVE) ×4 IMPLANT
GLOVE BIOGEL PI INDICATOR 7.5 (GLOVE) ×8
GLOVE SURG SS PI 7.0 STRL IVOR (GLOVE) ×15 IMPLANT
GOWN STRL REUS W/ TWL LRG LVL3 (GOWN DISPOSABLE) ×2 IMPLANT
GOWN STRL REUS W/ TWL XL LVL3 (GOWN DISPOSABLE) IMPLANT
GOWN STRL REUS W/TWL 2XL LVL3 (GOWN DISPOSABLE) IMPLANT
GOWN STRL REUS W/TWL LRG LVL3 (GOWN DISPOSABLE) ×4
GOWN STRL REUS W/TWL XL LVL3 (GOWN DISPOSABLE)
HEMOSTAT POWDER KIT SURGIFOAM (HEMOSTASIS) IMPLANT
KIT BASIN OR (CUSTOM PROCEDURE TRAY) ×3 IMPLANT
KIT TURNOVER KIT B (KITS) ×3 IMPLANT
MATRIX HEMOSTAT SURGIFLO (HEMOSTASIS) ×3 IMPLANT
NEEDLE HYPO 22GX1.5 SAFETY (NEEDLE) ×3 IMPLANT
NEEDLE SPNL 22GX3.5 QUINCKE BK (NEEDLE) ×3 IMPLANT
NS IRRIG 1000ML POUR BTL (IV SOLUTION) ×3 IMPLANT
OIL CARTRIDGE MAESTRO DRILL (MISCELLANEOUS) ×3
PACK LAMINECTOMY NEURO (CUSTOM PROCEDURE TRAY) ×3 IMPLANT
PAD ARMBOARD 7.5X6 YLW CONV (MISCELLANEOUS) ×9 IMPLANT
PLATE 2 37.5XLCK NS SPNE CVD (Plate) ×1 IMPLANT
PLATE 2 ATLANTIS TRANS (Plate) ×2 IMPLANT
RUBBERBAND STERILE (MISCELLANEOUS) ×6 IMPLANT
SCREW SELF TAP VAR 4.0X13 (Screw) ×18 IMPLANT
SPONGE INTESTINAL PEANUT (DISPOSABLE) ×3 IMPLANT
SPONGE SURGIFOAM ABS GEL 100 (HEMOSTASIS) ×3 IMPLANT
STRIP CLOSURE SKIN 1/2X4 (GAUZE/BANDAGES/DRESSINGS) IMPLANT
SUT ETHILON 3 0 FSL (SUTURE) IMPLANT
SUT VIC AB 3-0 SH 8-18 (SUTURE) ×6 IMPLANT
SUT VICRYL 3-0 RB1 18 ABS (SUTURE) ×6 IMPLANT
TAPE CLOTH 3X10 TAN LF (GAUZE/BANDAGES/DRESSINGS) ×3 IMPLANT
TOWEL GREEN STERILE (TOWEL DISPOSABLE) ×3 IMPLANT
TOWEL GREEN STERILE FF (TOWEL DISPOSABLE) ×3 IMPLANT
WATER STERILE IRR 1000ML POUR (IV SOLUTION) ×3 IMPLANT

## 2018-06-15 NOTE — Anesthesia Preprocedure Evaluation (Addendum)
Anesthesia Evaluation  Patient identified by MRN, date of birth, ID band Patient awake    Reviewed: Allergy & Precautions, NPO status , Patient's Chart, lab work & pertinent test results  Airway Mallampati: II  TM Distance: >3 FB Neck ROM: Full    Dental  (+) Teeth Intact, Dental Advisory Given   Pulmonary    breath sounds clear to auscultation       Cardiovascular hypertension, Pt. on medications  Rhythm:Regular Rate:Normal     Neuro/Psych  Headaches, Seizures -,  Anxiety Depression  Neuromuscular disease    GI/Hepatic Neg liver ROS, GERD  Medicated,  Endo/Other  diabetes, Type 2, Oral Hypoglycemic Agents  Renal/GU negative Renal ROS     Musculoskeletal  (+) Arthritis ,   Abdominal Normal abdominal exam  (+)   Peds  Hematology   Anesthesia Other Findings   Reproductive/Obstetrics negative OB ROS                             Anesthesia Physical  Anesthesia Plan  ASA: III  Anesthesia Plan: General   Post-op Pain Management:    Induction: Intravenous  PONV Risk Score and Plan: 4 or greater and Ondansetron, Dexamethasone, Midazolam and Scopolamine patch - Pre-op  Airway Management Planned: Video Laryngoscope Planned and Oral ETT  Additional Equipment: None  Intra-op Plan:   Post-operative Plan: Extubation in OR  Informed Consent: I have reviewed the patients History and Physical, chart, labs and discussed the procedure including the risks, benefits and alternatives for the proposed anesthesia with the patient or authorized representative who has indicated his/her understanding and acceptance.     Dental advisory given  Plan Discussed with: CRNA  Anesthesia Plan Comments:         Anesthesia Quick Evaluation  

## 2018-06-15 NOTE — Anesthesia Postprocedure Evaluation (Signed)
Anesthesia Post Note  Patient: WAVE CALZADA  Procedure(s) Performed: ANTERIOR CERVICAL DECOMPRESSION/DISCECTOMY FUSION CERVICAL 3- CERVICAL 4, CERVICAL 4- CERVICAL 5 (N/A Neck)     Patient location during evaluation: PACU Anesthesia Type: General Level of consciousness: awake Pain management: pain level controlled Vital Signs Assessment: post-procedure vital signs reviewed and stable Respiratory status: spontaneous breathing Cardiovascular status: stable Postop Assessment: no apparent nausea or vomiting Anesthetic complications: no    Last Vitals:  Vitals:   06/15/18 1137 06/15/18 2147  BP: (!) 143/89 (!) 164/92  Pulse: 77 84  Resp: 18 (!) 23  Temp: 37.7 C 36.7 C  SpO2: 95% 96%    Last Pain:  Vitals:   06/15/18 2147  TempSrc:   PainSc: 0-No pain                 Ceanna Wareing
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
## 2018-06-15 NOTE — Op Note (Signed)
NEUROSURGERY OPERATIVE NOTE   PREOP DIAGNOSIS: Cervical Spondylosis with myelopathy, C3-4, C4-5  POSTOP DIAGNOSIS: Same  PROCEDURE: 1. Discectomy at C3-4, C4-5 for decompression of spinal cord and exiting nerve roots  2. Placement of intervertebral biomechanical device, Medtronic 94m PTC PEEK @ C3-4, 573mPTC PEEK @ C4-5 3. Placement of anterior instrumentation consisting of interbody plate and screws spanning C3-C5 -Medtronic Atlantis translational plate 4. Use of morselized bone allograft  5. Arthrodesis C3-4, C4-5, anterior interbody technique  6. Use of intraoperative microscope  SURGEON: Dr. NeConsuella LoseMD  ASSISTANT: ViFerne ReusPA-C  ANESTHESIA: General Endotracheal  EBL: 50cc  SPECIMENS: None  DRAINS: None  COMPLICATIONS: None immediate  CONDITION: Hemodynamically stable to PACU  HISTORY: Kendra [name] a [age].o. woman who has been followed in the outpatient neurosurgery clinic initially for low back pain and multiple previous surgeries.  She presented again with symptoms of myelopathy including difficulty transferring from her wheelchair, incoordination, weakness of the upper extremities, and inability to feed herself with her more dominant right arm.  Her imaging did reveal significant cervical spondylosis with spinal cord compression and radiographic evidence of myelopathy.  With her symptoms and radiographic findings surgical decompression was indicated.  The risks and benefits of the surgery were explained in detail to both the patient and her family.  After all questions were answered informed consent was obtained and witnessed.  PROCEDURE IN DETAIL: The patient was brought to the operating room and transferred to the operative table. After induction of general anesthesia, the patient was positioned on the operative table in the supine position with all pressure points meticulously padded. The skin of the neck was then prepped and draped in the usual  sterile fashion.  After timeout was conducted, the skin was infiltrated with local anesthetic. Skin incision was then made sharply and Bovie electrocautery was used to dissect the subcutaneous tissue until the platysma was identified. The platysma was then divided longitudinally and undermined. The sternocleidomastoid muscle was then identified and, utilizing natural fascial planes in the neck, the prevertebral fascia was identified and the carotid sheath was retracted laterally and the trachea and esophagus retracted medially. Again using fluoroscopy, the correct disc spaces were identified. Bovie electrocautery was used to dissect in the subperiosteal plane and elevate the bilateral longus coli muscles. Table mounted retractors were then placed. At this point, the microscope was draped and brought into the field, and the remainder of the case was done under the microscope using microdissecting technique.  The C3-4 disc space was incised sharply and rongeurs were use to initially complete a discectomy. The high-speed drill was then used to complete discectomy until the posterior annulus was identified and removed and the posterior longitudinal ligament was identified. Using a nerve hook, the PLL was elevated, and Kerrison rongeurs were used to remove the posterior longitudinal ligament and the ventral thecal sac was identified. Using a combination of curettes and rongeurs, complete decompression of the thecal sac and exiting nerve roots at this level was completed, and verified using micro-nerve hook.  At this point, a lordotic 7 mm interbody cage was sized and packed with morcellized bone allograft. This was then inserted and tapped into place.  Attention was then turned to the C4-5 level. In a similar fashion, discectomy was completed initially with curettes and rongeurs, and completed with the drill. The PLL was again identified, elevated and incised. Using Kerrison rongeurs, decompression of the spinal  cord and exiting roots at this level was completed  and confirmed with a dissector.  A 5 mm lordotic interbody cage was then sized and filled with bone allograft, and tapped into place.   After placement of the intervertebral devices, the anterior cervical plate was selected, and placed across the interspaces. Using a high-speed drill, the cortex of the cervical vertebral bodies was punctured, and screws inserted in the C3, C4, and C5 levels. Final fluoroscopic images in lateral projection were taken to confirm good hardware placement.  At this point, after all counts were verified to be correct, meticulous hemostasis was secured using a combination of bipolar electrocautery and passive hemostatics. The platysma muscle was then closed using interrupted 3-0 Vicryl sutures, and the skin was closed with a interrupted subcuticular stitch. Sterile dressings were then applied and the drapes removed.  The patient tolerated the procedure well and was extubated in the room and taken to the postanesthesia care unit in stable condition.

## 2018-06-15 NOTE — Transfer of Care (Signed)
Immediate Anesthesia Transfer of Care Note  Patient: Kendra [name]  Procedure(s) Performed: ANTERIOR CERVICAL DECOMPRESSION/DISCECTOMY FUSION CERVICAL 3- CERVICAL 4, CERVICAL 4- CERVICAL 5 (N/A Neck)  Patient Location: PACU  Anesthesia Type:General  Level of Consciousness: awake, alert  and patient cooperative  Airway & Oxygen Therapy: Patient Spontanous Breathing and Patient connected to nasal cannula oxygen  Post-op Assessment: Report given to RN, Post -op Vital signs reviewed and stable and Patient moving all extremities X 4  Post vital signs: Reviewed and stable  Last Vitals:  Vitals Value Taken Time  BP 164/92 06/15/2018  9:47 PM  Temp    Pulse 84 06/15/2018  9:47 PM  Resp 23 06/15/2018  9:47 PM  SpO2 96 % 06/15/2018  9:47 PM  Vitals shown include unvalidated device data.  Last Pain:  Vitals:   06/15/18 1206  TempSrc:   PainSc: 6          Complications: No apparent anesthesia complications

## 2018-06-15 NOTE — OR Nursing (Signed)
Upon patient arriving to room it was discovered that the case had been set up with latex and the patient has a latex allergy. All supplies and instruments were taken out of room and case was set up again without latex. Patient remained awake in room during the setup.

## 2018-06-15 NOTE — Anesthesia Procedure Notes (Signed)
Procedure Name: Intubation Date/Time: 06/15/2018 6:45 PM Performed by: Claris Che, CRNA Pre-anesthesia Checklist: Patient identified, Emergency Drugs available, Suction available, Patient being monitored and Timeout performed Patient Re-evaluated:Patient Re-evaluated prior to induction Oxygen Delivery Method: Circle system utilized Preoxygenation: Pre-oxygenation with 100% oxygen Induction Type: IV induction and Cricoid Pressure applied Ventilation: Mask ventilation without difficulty Laryngoscope Size: Mac and 3 Grade View: Grade I Tube type: Oral Tube size: 7.0 mm Number of attempts: 1 Airway Equipment and Method: Stylet Placement Confirmation: ETT inserted through vocal cords under direct vision,  positive ETCO2 and breath sounds checked- equal and bilateral Secured at: 23 cm Tube secured with: Tape Dental Injury: Teeth and Oropharynx as per pre-operative assessment

## 2018-06-16 ENCOUNTER — Other Ambulatory Visit: Payer: Self-pay

## 2018-06-16 ENCOUNTER — Encounter (HOSPITAL_COMMUNITY): Payer: Self-pay | Admitting: Neurosurgery

## 2018-06-16 LAB — CBC
HCT: 34.9 % — ABNORMAL LOW (ref 36.0–46.0)
Hemoglobin: 10.9 g/dL — ABNORMAL LOW (ref 12.0–15.0)
MCH: 28.1 pg (ref 26.0–34.0)
MCHC: 31.2 g/dL (ref 30.0–36.0)
MCV: 89.9 fL (ref 78.0–100.0)
Platelets: 378 10*3/uL (ref 150–400)
RBC: 3.88 MIL/uL (ref 3.87–5.11)
RDW: 13.5 % (ref 11.5–15.5)
WBC: 8.4 10*3/uL (ref 4.0–10.5)

## 2018-06-16 LAB — GLUCOSE, CAPILLARY: GLUCOSE-CAPILLARY: 186 mg/dL — AB (ref 70–99)

## 2018-06-16 LAB — BASIC METABOLIC PANEL
Anion gap: 12 (ref 5–15)
BUN: 21 mg/dL — ABNORMAL HIGH (ref 6–20)
CO2: 27 mmol/L (ref 22–32)
Calcium: 9.2 mg/dL (ref 8.9–10.3)
Chloride: 102 mmol/L (ref 98–111)
Creatinine, Ser: 0.7 mg/dL (ref 0.44–1.00)
GFR calc Af Amer: >60 mL/min (ref >60)
GFR calc non Af Amer: >60 mL/min (ref >60)
Glucose, Bld: 201 mg/dL — ABNORMAL HIGH (ref 70–99)
Potassium: 3.1 mmol/L — ABNORMAL LOW (ref 3.5–5.1)
Sodium: 141 mmol/L (ref 135–145)

## 2018-06-16 LAB — PROTIME-INR
INR: 1.03
Prothrombin Time: 13.4 s (ref 11.4–15.2)

## 2018-06-16 LAB — APTT: aPTT: 28 s (ref 24–36)

## 2018-06-16 MED ORDER — SODIUM CHLORIDE 0.9 % IV SOLN
250.0000 mL | INTRAVENOUS | Status: DC
  Administered 2018-06-20: 250 mL via INTRAVENOUS

## 2018-06-16 MED ORDER — ACETAMINOPHEN 650 MG RE SUPP: 650.0000 mg | RECTAL | Status: DC | PRN

## 2018-06-16 MED ORDER — RESOURCE THICKENUP CLEAR PO POWD
ORAL | Status: DC | PRN
  Filled 2018-06-16: qty 125

## 2018-06-16 MED ORDER — ACETAMINOPHEN 500 MG PO TABS
1000.0000 mg | ORAL_TABLET | Freq: Four times a day (QID) | ORAL | Status: AC
  Administered 2018-06-16 (×2): 1000 mg via ORAL
  Filled 2018-06-16 (×2): qty 2

## 2018-06-16 MED ORDER — OXYCODONE HCL 5 MG PO TABS
5.0000 mg | ORAL_TABLET | ORAL | Status: DC | PRN
  Administered 2018-06-16 – 2018-06-22 (×10): 10 mg via ORAL
  Filled 2018-06-16 (×2): qty 2
  Filled 2018-06-16: qty 1
  Filled 2018-06-16 (×9): qty 2

## 2018-06-16 MED ORDER — SENNA 8.6 MG PO TABS
1.0000 | ORAL_TABLET | Freq: Two times a day (BID) | ORAL | Status: DC
  Administered 2018-06-16 – 2018-06-22 (×9): 8.6 mg via ORAL
  Filled 2018-06-16 (×9): qty 1

## 2018-06-16 MED ORDER — SENNOSIDES-DOCUSATE SODIUM 8.6-50 MG PO TABS: 1.0000 | ORAL_TABLET | Freq: Every evening | ORAL | Status: DC | PRN

## 2018-06-16 MED ORDER — MENTHOL 3 MG MT LOZG: 1.0000 | LOZENGE | OROMUCOSAL | Status: DC | PRN

## 2018-06-16 MED ORDER — HYDROMORPHONE HCL 1 MG/ML IJ SOLN
0.5000 mg | INTRAMUSCULAR | Status: DC | PRN
  Administered 2018-06-16: 1 mg via INTRAVENOUS
  Administered 2018-06-16: 0.5 mg via INTRAVENOUS
  Administered 2018-06-16 – 2018-06-17 (×7): 1 mg via INTRAVENOUS
  Filled 2018-06-16 (×9): qty 1

## 2018-06-16 MED ORDER — METHOCARBAMOL 500 MG PO TABS
500.0000 mg | ORAL_TABLET | Freq: Four times a day (QID) | ORAL | Status: DC | PRN
  Administered 2018-06-17 – 2018-06-22 (×6): 500 mg via ORAL
  Filled 2018-06-16 (×6): qty 1

## 2018-06-16 MED ORDER — PNEUMOCOCCAL VAC POLYVALENT 25 MCG/0.5ML IJ INJ
0.5000 mL | INJECTION | INTRAMUSCULAR | Status: AC
  Administered 2018-06-17: 0.5 mL via INTRAMUSCULAR
  Filled 2018-06-16: qty 0.5

## 2018-06-16 MED ORDER — SODIUM CHLORIDE 0.9% FLUSH
3.0000 mL | Freq: Two times a day (BID) | INTRAVENOUS | Status: DC
  Administered 2018-06-16 – 2018-06-22 (×13): 3 mL via INTRAVENOUS

## 2018-06-16 MED ORDER — ONDANSETRON HCL 4 MG PO TABS: 4.0000 mg | ORAL_TABLET | Freq: Four times a day (QID) | ORAL | Status: DC | PRN

## 2018-06-16 MED ORDER — PHENOL 1.4 % MT LIQD: 1.0000 | OROMUCOSAL | Status: DC | PRN

## 2018-06-16 MED ORDER — ACETAMINOPHEN 325 MG PO TABS: 650.0000 mg | ORAL_TABLET | ORAL | Status: DC | PRN

## 2018-06-16 MED ORDER — METHOCARBAMOL 1000 MG/10ML IJ SOLN
500.0000 mg | Freq: Four times a day (QID) | INTRAVENOUS | Status: DC | PRN
  Administered 2018-06-17 – 2018-06-19 (×3): 500 mg via INTRAVENOUS
  Filled 2018-06-16 (×3): qty 5

## 2018-06-16 MED ORDER — SODIUM CHLORIDE 0.9 % IV SOLN
INTRAVENOUS | Status: DC
  Administered 2018-06-16 – 2018-06-19 (×4): via INTRAVENOUS

## 2018-06-16 MED ORDER — FLEET ENEMA 7-19 GM/118ML RE ENEM
1.0000 | ENEMA | Freq: Once | RECTAL | Status: AC | PRN
  Administered 2018-06-17: 1 via RECTAL
  Filled 2018-06-16: qty 1

## 2018-06-16 MED ORDER — ONDANSETRON HCL 4 MG/2ML IJ SOLN: 4.0000 mg | Freq: Four times a day (QID) | INTRAMUSCULAR | Status: DC | PRN

## 2018-06-16 MED ORDER — SODIUM CHLORIDE 0.9% FLUSH: 3.0000 mL | INTRAVENOUS | Status: DC | PRN

## 2018-06-16 MED ORDER — VANCOMYCIN HCL IN DEXTROSE 1-5 GM/200ML-% IV SOLN
1000.0000 mg | Freq: Once | INTRAVENOUS | Status: AC
  Administered 2018-06-16: 1000 mg via INTRAVENOUS
  Filled 2018-06-16: qty 200

## 2018-06-16 MED ORDER — BISACODYL 10 MG RE SUPP: 10.0000 mg | Freq: Every day | RECTAL | Status: DC | PRN

## 2018-06-16 MED ORDER — DOCUSATE SODIUM 100 MG PO CAPS
100.0000 mg | ORAL_CAPSULE | Freq: Two times a day (BID) | ORAL | Status: DC
  Administered 2018-06-16 – 2018-06-22 (×9): 100 mg via ORAL
  Filled 2018-06-16 (×9): qty 1

## 2018-06-16 MED ORDER — HYDROCODONE-ACETAMINOPHEN 5-325 MG PO TABS
1.0000 | ORAL_TABLET | ORAL | Status: DC | PRN
  Administered 2018-06-16 – 2018-06-22 (×6): 1 via ORAL
  Filled 2018-06-16 (×6): qty 1
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
## 2018-06-16 NOTE — Progress Notes (Signed)
Neurosurgery Progress Note  No issues overnight. Feels as though RUE coordination is much improved Has moderate pain, but unable to tolerate any pain meds other than Tylenol  EXAM:  BP (!) 155/76 (BP Location: Right Arm)   Pulse 81   Temp 99 F (37.2 C) (Oral)   Resp 18   Ht 5\' 4"  (1.626 m)   Wt 90.7 kg   LMP  (LMP Unknown) Comment: reports periods come annd go.    SpO2 97%   BMI 34.33 kg/m   Awake, alert, oriented  Dysphonia Left sided spastic hemiparesis Eating at bedside with OT using RUE  IMPRESSION/PLAN 57 y.o. female s/p C3-4 and C4-5 ACDF due to cervical stenosis with myelopathy. - Much improved coordination with RUE this am. Subjective improvement in strength - Continue current care - PT rec SNF. Will need to discuss with family as they are caregivers typically.

## 2018-06-16 NOTE — NC FL2 (Signed)
[place] MEDICAID FL2 LEVEL OF CARE SCREENING TOOL     IDENTIFICATION  Patient Name: Kendra [name] Birthdate: [birth year] Sex: female Admission Date (Current Location): 06/15/2018  [facility] and [place] Number:  Herbalist and Address:  [facility], [place] [address] [place], [place] [address]      Provider Number: [id number]  Attending Physician Name and Address:  Consuella Lose, MD  Relative Name and Phone Number:  [name]; sister; [phone]    Current Level of Care: Hospital Recommended Level of Care: [place] Prior Approval Number:    Date Approved/Denied:   PASRR Number: [id number]  Discharge Plan: SNF    Current Diagnoses: Patient Active Problem List   Diagnosis Date Noted  . 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[place]) 06/15/2018  . Localization-related (focal) (partial) symptomatic epilepsy and epileptic syndromes with complex partial seizures, intractable, without status epilepticus ([place]) 12/17/2015  . Staring spell 11/04/2015  . History of traumatic brain injury 11/04/2015  . Lumbar vertebral fracture ([place]) 03/22/2015  . Degeneration of lumbar or lumbosacral intervertebral disc 02/11/2015  . Degenerative disc disease, lumbar 02/11/2015  . Lumbar radiculopathy 10/30/2014  . Thoracic or lumbosacral neuritis or radiculitis, unspecified 12/19/2013  . Orthostasis 08/20/2013  . Sprained ankle 04/18/2013  . Greater trochanteric bursitis of right hip 12/20/2012  . Lumbar post-laminectomy syndrome 11/21/2012  . Traumatic brain injury ([place]) 01/21/2012  . Left spastic hemiparesis 01/21/2012  . Arthritis of shoulder 01/21/2012  . Osteoarthritis, knee 01/21/2012  . Bilateral swelling of feet 03/25/2011  . Night sweats 03/25/2011  . High blood pressure 03/25/2011  . IBS (irritable bowel syndrome) 03/25/2011    Orientation RESPIRATION BLADDER Height & Weight     Self, Situation, Time, Place  Normal  Incontinent, External catheter Weight: 200 lb (90.7 kg) Height:  5\' 4"  (162.6 cm)  BEHAVIORAL SYMPTOMS/MOOD NEUROLOGICAL BOWEL NUTRITION STATUS      Continent Diet(see discharge summary)  AMBULATORY STATUS COMMUNICATION OF NEEDS Skin   Total Care Verbally PU Stage and Appropriate Care, Surgical wounds(surgical incision on neck with honeycomb)   PU Stage 2 Dressing: (buttocks with foam)                   Personal Care Assistance Level of Assistance  Bathing, Feeding, Dressing Bathing Assistance: Maximum assistance Feeding assistance: Limited assistance Dressing Assistance: Maximum assistance     Functional Limitations Info  Sight, Hearing, Speech Sight Info: Adequate Hearing Info: Adequate Speech Info: Impaired(slurred)    SPECIAL CARE FACTORS FREQUENCY  PT (By licensed PT), OT (By licensed OT)     PT Frequency: 5x week OT Frequency: 5x week            Contractures Contractures Info: Not present    Additional Factors Info  Code Status, Allergies Code Status Info: Full Code Allergies Info: GABAPENTIN, BACTRIM, DARVOCET PROPOXYPHENE N-ACETAMINOPHEN, LATEX, PENICILLINS, PERCOCET OXYCODONE-ACETAMINOPHEN, SYNVISC HYLAN G-F 20, ULTRACET TRAMADOL-ACETAMINOPHEN            Current Medications (06/16/2018):  This is the current hospital active medication list Current Facility-Administered Medications  Medication Dose Route Frequency Provider Last Rate Last Dose  . 0.9 %  sodium chloride infusion   Intravenous Continuous Traci Sermon, PA-C 10 mL/hr at 06/16/18 0102    . 0.9 %  sodium chloride infusion  250 mL Intravenous Continuous Costella, Vista Mink, PA-C      . acetaminophen (TYLENOL) tablet 650 mg  650 mg Oral Q4H PRN Costella, Vista Mink,  PA-C       Or  . acetaminophen (TYLENOL) suppository 650 mg  650 mg Rectal Q4H PRN Costella, Vista Mink, PA-C      . acetaminophen (TYLENOL) tablet 1,000 mg  1,000 mg Oral Q6H Costella, Vincent J, PA-C   1,000 mg at 06/16/18 1353   . bisacodyl (DULCOLAX) suppository 10 mg  10 mg Rectal Daily PRN Costella, Vista Mink, PA-C      . docusate sodium (COLACE) capsule 100 mg  100 mg Oral BID Costella, Vincent J, PA-C   100 mg at 06/16/18 1056  . HYDROcodone-acetaminophen (NORCO/VICODIN) 5-325 MG per tablet 1 tablet  1 tablet Oral Q4H PRN Traci Sermon, PA-C   1 tablet at 06/16/18 0147  . HYDROmorphone (DILAUDID) injection 0.5-1 mg  0.5-1 mg Intravenous Q2H PRN Costella, Vista Mink, PA-C   0.5 mg at 06/16/18 0701  . lactated ringers infusion   Intravenous Continuous Effie Berkshire, MD 10 mL/hr at 06/15/18 1221    . menthol-cetylpyridinium (CEPACOL) lozenge 3 mg  1 lozenge Oral PRN Costella, Vista Mink, PA-C       Or  . phenol (CHLORASEPTIC) mouth spray 1 spray  1 spray Mouth/Throat PRN Costella, Vista Mink, PA-C      . methocarbamol (ROBAXIN) tablet 500 mg  500 mg Oral Q6H PRN Costella, Vista Mink, PA-C       Or  . methocarbamol (ROBAXIN) 500 mg in dextrose 5 % 50 mL IVPB  500 mg Intravenous Q6H PRN Costella, Vincent J, PA-C      . ondansetron (ZOFRAN) tablet 4 mg  4 mg Oral Q6H PRN Costella, Vincent J, PA-C       Or  . ondansetron (ZOFRAN) injection 4 mg  4 mg Intravenous Q6H PRN Costella, Vincent J, PA-C      . oxyCODONE (Oxy IR/ROXICODONE) immediate release tablet 5-10 mg  5-10 mg Oral Q3H PRN Costella, Vista Mink, PA-C      . [START ON 06/17/2018] pneumococcal 23 valent vaccine (PNU-IMMUNE) injection 0.5 mL  0.5 mL Intramuscular Tomorrow-1000 Consuella Lose, MD      . RESOURCE THICKENUP CLEAR   Oral PRN Consuella Lose, MD      . senna (SENOKOT) tablet 8.6 mg  1 tablet Oral BID Costella, Vincent J, PA-C   8.6 mg at 06/16/18 1055  . senna-docusate (Senokot-S) tablet 1 tablet  1 tablet Oral QHS PRN Costella, Vincent J, PA-C      . sodium chloride flush (NS) 0.9 % injection 3 mL  3 mL Intravenous Q12H Costella, Vincent J, PA-C   3 mL at 06/16/18 1056  . sodium chloride flush (NS) 0.9 % injection 3 mL  3 mL Intravenous PRN  Costella, Vista Mink, PA-C      . sodium phosphate (FLEET) 7-19 GM/118ML enema 1 enema  1 enema Rectal Once PRN Costella, Vista Mink, PA-C         Discharge Medications: Please see discharge summary for a list of discharge medications.  Relevant Imaging Results:  Relevant Lab Results:   Additional Information SS# [place], [place]

## 2018-06-16 NOTE — Progress Notes (Signed)
  NEUROSURGERY PROGRESS NOTE   See Progress Note from [facility], PA-C No issues overnight, pt and family report improvement in RUE coordination, and BLE strength. Tolerating liquids. Cont therapy and will plan on SNF placement. PT and family prefer [facility] if possible.

## 2018-06-16 NOTE — Evaluation (Signed)
Occupational Therapy Evaluation Patient Details Name: Kendra [name] MRN: [id number] DOB: [birth year] Today's Date: 06/16/2018    History of Present Illness Pt is a 57 y.o. 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 Over the course of several weeks she started to develop progressive weakness of the right arm, incoordination including inability to safely feed herself, and loss of ability to safely transfer in and out of the car and from bed to her wheelchair.  An MRI of her cervical spine was ordered which showed cervical stenosis with myelopathy.  She underwent ACDF 06-15-18.    Clinical Impression   Pt s/p ACDF and now presenting with decreased motor control of her R UE, old L UE spastic hemi, generalized muscle weakness, unspecified cognitive deficits, and poor sitting/standing balance that prevent her from completing ADLs with prior level of functioning. Pt completed self feeding at seated level with mod A overall, with high spillage, and manual facilitation required to bring hand to mouth. RN alerted to pt exhibiting s/s of aspiration during thin liquids, especially from a straw. SLP consult added. Skilled positioning performed to facilitate upright posture d/t strong L lateral lean. Pt would benefit from skilled OT services and SNF placement.     Follow Up Recommendations  SNF;Supervision/Assistance - 24 hour    Equipment Recommendations  Other (comment)(defer to next venue)    Recommendations for Other Services PT consult;Speech consult     Precautions / Restrictions Precautions Precautions: Fall Required Braces or Orthoses: Cervical Brace Cervical Brace: Hard collar Restrictions Other Position/Activity Restrictions: Pt reports she wears an AFO LLE which she does not have here at the hospital.       Mobility Bed Mobility Overal bed mobility: Needs Assistance Bed Mobility: Supine to Sit      Supine to sit: Max assist;HOB elevated     General bed mobility comments: pt OOB upon entry  Transfers Overall transfer level: Needs assistance Equipment used: None Transfers: Squat Pivot Transfers     Squat pivot transfers: +2 physical assistance;Total assist     General transfer comment: No +2 assistance, session focused on self feeding    Balance Overall balance assessment: Needs assistance Sitting-balance support: Single extremity supported;Feet supported Sitting balance-Leahy Scale: Poor Sitting balance - Comments: mod assist to maintain sitting balance EOB Postural control: Posterior lean;Left lateral lean   Standing balance-Leahy Scale: Zero                             ADL either performed or assessed with clinical judgement   ADL Overall ADL's : Needs assistance/impaired Eating/Feeding: Moderate assistance;Sitting Eating/Feeding Details (indicate cue type and reason): occasional scoop assist, spillage 2/4 trials, frequent vc and tactile cues for upright posture.  Grooming: Wash/dry face;Minimal assistance;Sitting   Upper Body Bathing: Sitting;Moderate assistance   Lower Body Bathing: Maximal assistance;Bed level   Upper Body Dressing : Sitting;Maximal assistance   Lower Body Dressing: Total assistance;Bed level   Toilet Transfer: Total assistance;+2 for physical assistance   Toileting- Clothing Manipulation and Hygiene: Total assistance;+2 for physical assistance   Tub/ Shower Transfer: Total assistance;+2 for physical assistance   Functional mobility during ADLs: Maximal assistance;+2 for physical assistance;Cueing for safety       Vision Baseline Vision/History: (pt poor historian) Patient Visual Report: Other (comment)(unable to accurately report) Vision Assessment?: No apparent visual deficits  Pertinent Vitals/Pain Pain Assessment: Faces Faces Pain Scale: Hurts little more Pain Location: neck Pain Descriptors /  Indicators: Sore Pain Intervention(s): Monitored during session     Hand Dominance Right   Extremity/Trunk Assessment Upper Extremity Assessment Upper Extremity Assessment: RUE deficits/detail;LUE deficits/detail RUE Deficits / Details: Poor motor planning, min A for hand to mouth RUE Coordination: decreased fine motor;decreased gross motor(able to hold utensil) LUE Deficits / Details: old spastic hemi LUE Coordination: decreased fine motor;decreased gross motor(poor proprioception)   Lower Extremity Assessment Lower Extremity Assessment: Defer to PT evaluation RLE Deficits / Details: generalized weakness LLE Deficits / Details: previous TBI with resulting L spastic hemiparesis, Pt reports wearing AFO.     Cervical / Trunk Assessment Cervical / Trunk Assessment: Other exceptions;Kyphotic Cervical / Trunk Exceptions: left lateral flexion    Communication Communication Communication: Expressive difficulties   Cognition Arousal/Alertness: Awake/alert Behavior During Therapy: WFL for tasks assessed/performed Overall Cognitive Status: History of cognitive impairments - at baseline                                 General Comments: Alert and oriented. Follows simple commands.    General Comments  Difficult to assess PLOF cognitively or physically            Home Living Family/patient expects to be discharged to:: Private residence Living Arrangements: Other relatives Available Help at Discharge: Family;Available 24 hours/day(niece) Type of Home: House Home Access: Ramped entrance     Home Layout: One level     Bathroom Shower/Tub: Occupational psychologist: Handicapped height Bathroom Accessibility: Yes How Accessible: Accessible via wheelchair;Accessible via walker Home Equipment: Bedside commode;Shower seat - built in;Grab bars - tub/shower;Grab bars - toilet;Wheelchair - power          Prior Functioning/Environment Level of Independence:  Needs assistance  Gait / Transfers Assistance Needed: power wheelchair for mobility. Per pt, niece provides supervision for transfers. ADL's / Homemaking Assistance Needed: Niece assists with bathing and dressing. Unsure of assist level needed.  Communication / Swallowing Assistance Needed: Pt speaks in soft, whisper-like voice.           OT Problem List: Decreased strength;Decreased range of motion;Decreased activity tolerance;Impaired balance (sitting and/or standing);Decreased coordination;Decreased cognition;Decreased safety awareness;Decreased knowledge of use of DME or AE;Decreased knowledge of precautions;Pain;Impaired UE functional use;Impaired tone;Impaired sensation;Cardiopulmonary status limiting activity      OT Treatment/Interventions: Self-care/ADL training;Therapeutic exercise;Neuromuscular education;Energy conservation;DME and/or AE instruction;Manual therapy;Splinting;Modalities;Therapeutic activities;Cognitive remediation/compensation;Patient/family education;Balance training    OT Goals(Current goals can be found in the care plan section) Acute Rehab OT Goals Patient Stated Goal: get better OT Goal Formulation: With patient Time For Goal Achievement: 06/26/18 Potential to Achieve Goals: Fair  OT Frequency: Min 3X/week   Barriers to D/C: Decreased caregiver support  pt requiring max-total A          AM-PAC PT "6 Clicks" Daily Activity     Outcome Measure Help from another person eating meals?: A Lot Help from another person taking care of personal grooming?: A Lot Help from another person toileting, which includes using toliet, bedpan, or urinal?: Total Help from another person bathing (including washing, rinsing, drying)?: A Lot Help from another person to put on and taking off regular upper body clothing?: A Lot Help from another person to put on and taking off regular lower body clothing?: Total 6 Click Score: 10   End of Session Nurse Communication: Other  (  comment)(diet)  Activity Tolerance: Patient tolerated treatment well Patient left: in chair;with call bell/phone within reach;with chair alarm set  OT Visit Diagnosis: Other symptoms and signs involving cognitive function;Feeding difficulties (R63.3);Muscle weakness (generalized) (M62.81);Unsteadiness on feet (R26.81);Hemiplegia and hemiparesis;Cognitive communication deficit (R41.841) Hemiplegia - Right/Left: Left Hemiplegia - dominant/non-dominant: Non-Dominant                Time: 8592-9244 OT Time Calculation (min): 45 min Charges:  OT General Charges $OT Visit: 1 Visit OT Evaluation $OT Eval High Complexity: 1 High OT Treatments $Self Care/Home Management : 23-37 mins   Curtis Sites OTR/L 06/16/2018, 10:57 AM

## 2018-06-16 NOTE — Plan of Care (Signed)
  Problem: Safety: Goal: Ability to remain free from injury will improve Outcome: Progressing   

## 2018-06-16 NOTE — Social Work (Signed)
CSW acknowledging SNF recommendations, continue to follow for placement. Will need insurance authorization prior to discharge.   [place], [place] Work [phone]
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
## 2018-06-16 NOTE — Progress Notes (Signed)
Orthopedic Tech Progress Note Patient Details:  Kendra [name] 26-Dec-1960 [id number]  Patient ID: [name], female   DOB: [birth year], 56 y.o.   MRN: [id number]   [name] 06/16/2018, 3:03 PMCalled [employer] for [employer] collar.

## 2018-06-17 ENCOUNTER — Inpatient Hospital Stay (HOSPITAL_COMMUNITY): Payer: Medicare PPO

## 2018-06-17 LAB — GLUCOSE, CAPILLARY: Glucose-Capillary: 227 mg/dL — ABNORMAL HIGH (ref 70–99)

## 2018-06-17 NOTE — Progress Notes (Signed)
Modified Barium Swallow Progress Note  Patient Details  Name: Kendra [name] MRN: [id number] Date of Birth: 01/05/1961  Today's Date: 06/17/2018  Modified Barium Swallow completed.  Full report located under Chart Review in the Imaging Section.  Brief recommendations include the following:  Clinical Impression  Patient presents with moderate-severe pharyngeal and pharyngoesophageal dysphagic s/p ACDF. Edema most prominent in region of C3-5. Pt's vocal quality is also weak, breathy, and she was unable to hold her breath at glottis for supraglottic swallow; this is also contributing to airway compromise. Swallow initiation is delayed, and there is penetration/aspiration before and during the swallow due to delayed, incomplete closure of the laryngeal vestibule with intermittent sensation. Decreased pharyngeal stripping wave results in mild residue in the valleculae and along the posterior pharyngeal wall. Cough response is weak, inconsistent, and cued cough ineffective to eject deeper penetration/aspiration. Cued cough/throat clear was effective to eject shallower penetration. Recommend NPO with alternative means of nutrition. Discussed findings with pt's sister, who was present for the assessment, and Dr. Vertell Limber.    Swallow Evaluation Recommendations       SLP Diet Recommendations: NPO;Alternative means - temporary;Ice chips PRN after oral care       Medication Administration: Via alternative means               Oral Care Recommendations: Oral care QID     Deneise Lever, [place], CCC-SLP Speech-Language Pathologist Acute Rehabilitation Services Pager: [phone] Office: [phone]    Aliene Altes 06/17/2018,5:17 PM
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
## 2018-06-17 NOTE — Progress Notes (Signed)
Subjective: Patient reports "my legs hurt'  Objective: Vital signs in last 24 hours: Temp:  [98.2 F (36.8 C)-99.1 F (37.3 C)] 98.2 F (36.8 C) (08/31 0256) Pulse Rate:  [91-101] 101 (08/31 0256) Resp:  [18-20] 20 (08/31 0256) BP: (113-159)/(82-106) 138/87 (08/31 0256) SpO2:  [95 %-98 %] 98 % (08/31 0256)  Intake/Output from previous day: 08/30 0701 - 08/31 0700 In: 412 [P.O.:150; I.V.:262] Out: 850 [Urine:850] Intake/Output this shift: Total I/O In: 340 [I.V.:340] Out: -   Physical Exam: Dressing CDI.  Stable motor exam.  Dysphonia and dystonia.  Lab Results: Recent Labs    06/16/18 0353  WBC 8.4  HGB 10.9*  HCT 34.9*  PLT 378   BMET Recent Labs    06/16/18 0353  NA 141  K 3.1*  CL 102  CO2 27  GLUCOSE 201*  BUN 21*  CREATININE 0.70  CALCIUM 9.2    Studies/Results: Dg Cervical Spine 1 View  Result Date: 06/15/2018 CLINICAL DATA:  ACDF EXAM: DG CERVICAL SPINE - 1 VIEW; DG C-ARM 61-120 MIN COMPARISON:  MRI 05/02/2018 FINDINGS: Changes of ACDF from C3-C5. Normal alignment. No visible complicating feature. IMPRESSION: ACDF C3-C5.  No visible complicating feature. Electronically Signed   By: Rolm Baptise M.D.   On: 06/15/2018 22:16   Dg C-arm 1-60 Min  Result Date: 06/15/2018 CLINICAL DATA:  ACDF EXAM: DG CERVICAL SPINE - 1 VIEW; DG C-ARM 61-120 MIN COMPARISON:  MRI 05/02/2018 FINDINGS: Changes of ACDF from C3-C5. Normal alignment. No visible complicating feature. IMPRESSION: ACDF C3-C5.  No visible complicating feature. Electronically Signed   By: Rolm Baptise M.D.   On: 06/15/2018 22:16    Assessment/Plan: Patient says her legs are hurting and that this has been going on for a week.  She will discuss this with Dr. Kathyrn Sheriff in AM.  Awaiting SNF placement.    LOS: 2 days    Peggyann Shoals, MD 06/17/2018, 11:33 AM

## 2018-06-18 LAB — GLUCOSE, CAPILLARY
GLUCOSE-CAPILLARY: 142 mg/dL — AB (ref 70–99)
GLUCOSE-CAPILLARY: 142 mg/dL — AB (ref 70–99)
GLUCOSE-CAPILLARY: 132 mg/dL — AB (ref 70–99)
Glucose-Capillary: 148 mg/dL — ABNORMAL HIGH (ref 70–99)
Glucose-Capillary: 126 mg/dL — ABNORMAL HIGH (ref 70–99)
Glucose-Capillary: 124 mg/dL — ABNORMAL HIGH (ref 70–99)

## 2018-06-18 MED ORDER — TIZANIDINE HCL 4 MG PO TABS
4.0000 mg | ORAL_TABLET | Freq: Four times a day (QID) | ORAL | Status: DC | PRN
  Administered 2018-06-20 – 2018-06-22 (×6): 4 mg via ORAL
  Filled 2018-06-18 (×8): qty 1

## 2018-06-18 MED ORDER — MORPHINE SULFATE (PF) 2 MG/ML IV SOLN
1.0000 mg | INTRAVENOUS | Status: DC | PRN
  Administered 2018-06-18 – 2018-06-20 (×11): 2 mg via INTRAVENOUS
  Filled 2018-06-18 (×11): qty 1
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
## 2018-06-18 NOTE — Progress Notes (Addendum)
Pt c/o 6/10 mid to lower back pain radiating to R hip 8/31 HS - 06/18/18 HS.  NPO now except ice chips per ST.  Receiving Dilaudid 1mg  IV every 2 hours PRN, pt calling within 45 min after administration for more pain med.  Since NPO, PO oxycodone and Norco given priorly are not able to be given.   Called Dr. Vertell Limber per pain issues.  MD ordered Morphine IV 12mg  every hour PRN, Cardiac Monitoring.

## 2018-06-18 NOTE — Progress Notes (Signed)
Neurosurgery Progress Note  Significant BLE cramping pain. Reports this has been going on for months. Fluctuates in severity. Essentially nchanged from prior episodes. Otherwise, neck is mildly sore. Was placed on NPO by SLP.  EXAM:  BP (!) 153/78 (BP Location: Right Arm)   Pulse 96   Temp 98.3 F (36.8 C) (Oral)   Resp (!) 21   Ht 5\' 4"  (1.626 m)   Wt 90.7 kg   LMP  (LMP Unknown) Comment: reports periods come annd go.    SpO2 97%   BMI 34.33 kg/m   Awake, alert + dysphonia, + dystonia RUE coordination improved.  MAEW with good strength with exception of LUE (chronic) Wound has mild bruising and minimal swelling. Bandage c/d/o. No active drainage.  PLAN Stable this am. BLE cramping: chronic. Will trial Zanaflex instead of Robaxin NPO status: per SLP. Will increase fluids.

## 2018-06-19 DIAGNOSIS — L8990 Pressure ulcer of unspecified site, unspecified stage: Secondary | ICD-10-CM

## 2018-06-19 LAB — GLUCOSE, CAPILLARY
GLUCOSE-CAPILLARY: 130 mg/dL — AB (ref 70–99)
GLUCOSE-CAPILLARY: 131 mg/dL — AB (ref 70–99)
GLUCOSE-CAPILLARY: 277 mg/dL — AB (ref 70–99)
Glucose-Capillary: 173 mg/dL — ABNORMAL HIGH (ref 70–99)
Glucose-Capillary: 290 mg/dL — ABNORMAL HIGH (ref 70–99)
Glucose-Capillary: 234 mg/dL — ABNORMAL HIGH (ref 70–99)

## 2018-06-19 MED ORDER — DEXAMETHASONE SODIUM PHOSPHATE 4 MG/ML IJ SOLN
4.0000 mg | Freq: Four times a day (QID) | INTRAMUSCULAR | Status: DC
  Administered 2018-06-19 – 2018-06-20 (×4): 4 mg via INTRAVENOUS
  Filled 2018-06-19 (×4): qty 1

## 2018-06-19 MED ORDER — DEXAMETHASONE SODIUM PHOSPHATE 10 MG/ML IJ SOLN
10.0000 mg | Freq: Once | INTRAMUSCULAR | Status: AC
  Administered 2018-06-19: 10 mg via INTRAVENOUS
  Filled 2018-06-19: qty 1

## 2018-06-19 MED ORDER — LISINOPRIL 10 MG PO TABS
10.0000 mg | ORAL_TABLET | Freq: Every day | ORAL | Status: DC
  Administered 2018-06-19 – 2018-06-22 (×4): 10 mg via ORAL
  Filled 2018-06-19 (×4): qty 1

## 2018-06-19 MED ORDER — HYDRALAZINE HCL 20 MG/ML IJ SOLN
10.0000 mg | INTRAMUSCULAR | Status: DC | PRN
  Administered 2018-06-19: 10 mg via INTRAVENOUS
  Administered 2018-06-19: 20 mg via INTRAVENOUS
  Administered 2018-06-19 – 2018-06-20 (×2): 10 mg via INTRAVENOUS
  Administered 2018-06-21 – 2018-06-22 (×4): 20 mg via INTRAVENOUS
  Filled 2018-06-19 (×7): qty 1

## 2018-06-19 MED ORDER — PROPRANOLOL HCL 20 MG PO TABS
20.0000 mg | ORAL_TABLET | Freq: Three times a day (TID) | ORAL | Status: DC
  Administered 2018-06-19 – 2018-06-22 (×9): 20 mg via ORAL
  Filled 2018-06-19 (×10): qty 1

## 2018-06-19 NOTE — Care Management Important Message (Signed)
Important Message  Patient Details  Name: Kendra [name] MRN: [id number] Date of Birth: 07-31-1961   Medicare Important Message Given:  Yes    Orbie Pyo 06/19/2018, 3:28 PM

## 2018-06-19 NOTE — Progress Notes (Signed)
Pt morning BP was 190/95. MD Ferne Reus notified. Pt asymptomatic will continue to monitor. See order.

## 2018-06-19 NOTE — Progress Notes (Signed)
Neurosurgery Progress Note  No issues overnight.  Pain is manageable with current regimen Remains NPO per SLP  EXAM:  BP (!) 176/98 (BP Location: Right Arm)   Pulse 91   Temp 98.9 F (37.2 C) (Oral)   Resp (!) 27   Ht 5\' 4"  (1.626 m)   Wt 90.7 kg   LMP  (LMP Unknown) Comment: reports periods come annd go.    SpO2 97%   BMI 34.33 kg/m   Awake, alert, oriented  + dysphonia, + dystonia LUE chronic weakness, otherwise MAEW Wound with bruising and swelling. Bandage c/d/i  PLAN Stable this am NPO status: per SLP. Fluids increased yesterday.  Question how much of this is chronic in nature, but was not necessarily noticed. Will start on steroids to see if this will help. Will need to consider alternative measures of nutrition.

## 2018-06-19 NOTE — Plan of Care (Signed)
  Problem: Safety: Goal: Ability to remain free from injury will improve Outcome: Progressing   Problem: Education: Goal: Knowledge of the prescribed therapeutic regimen will improve Outcome: Progressing   Problem: Activity: Goal: Ability to tolerate increased activity will improve Outcome: Progressing   Problem: Health Behavior/Discharge Planning: Goal: Identification of resources available to assist in meeting health care needs will improve Outcome: Progressing   Problem: Nutrition: Goal: Maintenance of adequate nutrition will improve Outcome: Progressing   Problem: Clinical Measurements: Goal: Complications related to the disease process, condition or treatment will be avoided or minimized Outcome: Progressing
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
## 2018-06-20 ENCOUNTER — Inpatient Hospital Stay (HOSPITAL_COMMUNITY): Payer: Medicare PPO

## 2018-06-20 ENCOUNTER — Ambulatory Visit: Payer: Medicare PPO | Admitting: Neurology

## 2018-06-20 DIAGNOSIS — M7989 Other specified soft tissue disorders: Secondary | ICD-10-CM

## 2018-06-20 LAB — GLUCOSE, CAPILLARY
GLUCOSE-CAPILLARY: 210 mg/dL — AB (ref 70–99)
GLUCOSE-CAPILLARY: 185 mg/dL — AB (ref 70–99)
GLUCOSE-CAPILLARY: 188 mg/dL — AB (ref 70–99)
Glucose-Capillary: 231 mg/dL — ABNORMAL HIGH (ref 70–99)
Glucose-Capillary: 261 mg/dL — ABNORMAL HIGH (ref 70–99)
Glucose-Capillary: 177 mg/dL — ABNORMAL HIGH (ref 70–99)

## 2018-06-20 MED ORDER — INSULIN ASPART 100 UNIT/ML ~~LOC~~ SOLN
0.0000 [IU] | Freq: Every day | SUBCUTANEOUS | Status: DC
  Administered 2018-06-21: 3 [IU] via SUBCUTANEOUS

## 2018-06-20 MED ORDER — DEXAMETHASONE 4 MG PO TABS
4.0000 mg | ORAL_TABLET | Freq: Four times a day (QID) | ORAL | Status: DC
  Administered 2018-06-20 – 2018-06-22 (×8): 4 mg via ORAL
  Filled 2018-06-20 (×8): qty 1

## 2018-06-20 MED ORDER — INSULIN ASPART 100 UNIT/ML ~~LOC~~ SOLN
0.0000 [IU] | Freq: Three times a day (TID) | SUBCUTANEOUS | Status: DC
  Administered 2018-06-20: 8 [IU] via SUBCUTANEOUS
  Administered 2018-06-20: 3 [IU] via SUBCUTANEOUS
  Administered 2018-06-21 (×3): 5 [IU] via SUBCUTANEOUS
  Administered 2018-06-22 (×2): 3 [IU] via SUBCUTANEOUS

## 2018-06-20 NOTE — Clinical Social Work Note (Addendum)
Clinical Social Work Assessment  Patient Details  Name: Kendra [name] MRN: [id number] Date of Birth: 07/14/1961  Date of referral:  06/20/18               Reason for consult:  Facility Placement                Permission sought to share information with:  Family Supports, Customer service manager Permission granted to share information::  Yes, Verbal Permission Granted  Name::      [name]   Agency::   SNFs   Relationship::   sister  Contact Information:   [phone]  Housing/Transportation Living arrangements for the past 2 months:  [place] of Information:  Siblings Patient Interpreter Needed:  None Criminal Activity/Legal Involvement Pertinent to Current Situation/Hospitalization:  No - Comment as needed Significant Relationships:  Other Family Members, Siblings Lives with:  Self Do you feel safe going back to the place where you live?  Yes Need for family participation in patient care:  Yes (Comment)  Care giving concerns: Pt dependent with mobility, was able to transfer and perform some ADLs and IADLs prior to admission. Pt sister able to provide assistance but wants pt to be able to regain some of what she was able to do prior to admission.     Social Worker assessment / plan:  CSW met with pt and pt family at bedside; pt requesting CSW speak with sister. CSW spoke with pt's sister, presented her with offers. Pt is from [place] currently has no offers in [facility], has previously been to a SNF in [facility] which pt sister states she would prefer to not send her to again. Pt sister states she would prefer [facility], currently still a pending offer. Pt sister splits time between [place] and [place] and understands that pt needs insurance authorization prior to discharging to SNF, would like f/u from [place] tomorrow.   Employment status:  Disabled (Comment on whether or not currently receiving Disability) Insurance information:  Managed Care PT  Recommendations:  [place], 24 Hour Supervision Information / Referral to community resources:  [place]  Patient/Family's Response to care:  Pt sister amenable to speaking with CSW, aware pt requires insurance authorization prior to discharge, prefers SNF in [place].    Patient/Family's Understanding of and Emotional Response to Diagnosis, Current Treatment, and Prognosis:  Pt sister states understanding of diagnosis, current treatment and prognosis. Pt sister states hope that pt will be able to get back to self feeding and further tasks that she could do in the past. Pt sister is emotionally appropriate.  Emotional Assessment Appearance:  Appears stated age Attitude/Demeanor/Rapport:  Unable to Assess Affect (typically observed):  Unable to Assess Orientation:  Oriented to Self, Oriented to Place, Oriented to  Time, Oriented to Situation Alcohol / Substance use:  Not Applicable Psych involvement (Current and /or in the community):  No (Comment)  Discharge Needs  Concerns to be addressed:  Care Coordination, Discharge Planning Concerns Readmission within the last 30 days:  No Current discharge risk:  Dependent with Mobility, Chronically ill, Physical Impairment Barriers to Discharge:  Ship broker, Continued Medical Work up   [employer], [place] 06/20/2018, 4:57 PM

## 2018-06-20 NOTE — Progress Notes (Signed)
Patient tolerated sitting up in chair well without any difficulty.  Patient did state after sister left that she was getting tired.  Patient had a large bowel movement on the bed pan and tolerated well.  Sister stated that she is hoping for information tomorrow for SNFs in [place].

## 2018-06-20 NOTE — Social Work (Signed)
All updated clinicals sent to [facility].  Alexander Mt, [place] Work [phone]

## 2018-06-20 NOTE — Progress Notes (Signed)
Occupational Therapy Treatment Patient Details Name: Kendra [name] MRN: [id number] DOB: Jan 04, 1961 Today's Date: 06/20/2018    History of present illness Pt is a 57 y.o. 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 Over the course of several weeks she started to develop progressive weakness of the right arm, incoordination including inability to safely feed herself, and loss of ability to safely transfer in and out of the car and from bed to her wheelchair.  An MRI of her cervical spine was ordered which showed cervical stenosis with myelopathy.  She underwent ACDF 06-15-18.    OT comments  Pt progressed to chair this session with total +2 (A) and recommend RN staff use lift for transfers at this time. Pt requires positioning with pillows once in the chair to help with midline alignment. Family present and excited that patient is OOB and sitting.    Follow Up Recommendations  SNF;Supervision/Assistance - 24 hour    Equipment Recommendations  Hospital bed;Wheelchair cushion (measurements OT);Wheelchair (measurements OT);Other (comment)(lift equipment)    Recommendations for Other Services      Precautions / Restrictions Precautions Precautions: Fall Required Braces or Orthoses: Cervical Brace Cervical Brace: Hard collar       Mobility Bed Mobility Overal bed mobility: Needs Assistance Bed Mobility: Supine to Sit     Supine to sit: Max assist     General bed mobility comments: pt requires (A) for power off bed surface and strong L lean initially  Transfers Overall transfer level: Needs assistance   Transfers: Stand Pivot Transfers     Squat pivot transfers: +2 physical assistance;Max assist     General transfer comment: pt requires (A) to shift hips to chair surface. the extension on the L LE helps with transfer. Transfered to the R this session due to drop arm chair to clear surface     Balance Overall balance assessment: Needs assistance   Sitting balance-Leahy Scale: Poor   Postural control: Posterior lean;Left lateral lean   Standing balance-Leahy Scale: Zero                             ADL either performed or assessed with clinical judgement   ADL Overall ADL's : Needs assistance/impaired                                       General ADL Comments: total (A) to do bil shoes in the bed supine with (A) to help pull up. pt with decr ability to apply pressure to push foot into the shoe on the L. pt progressed to chair this session. educated Therapist, sports / Designer, multimedia on transfer back to bed x2 options lift or sliding with sheet     Vision       Perception     Praxis      Cognition Arousal/Alertness: Awake/alert Behavior During Therapy: WFL for tasks assessed/performed Overall Cognitive Status: History of cognitive impairments - at baseline                                 General Comments: family present and reports baseline. following commands and oriented        Exercises     Shoulder Instructions       General  Comments      Pertinent Vitals/ Pain       Pain Assessment: Faces Faces Pain Scale: Hurts a little bit Pain Intervention(s): Premedicated before session;Monitored during session;Repositioned  Home Living                                          Prior Functioning/Environment              Frequency  Min 3X/week        Progress Toward Goals  OT Goals(current goals can now be found in the care plan section)  Progress towards OT goals: Progressing toward goals  Acute Rehab OT Goals Patient Stated Goal: to get up more OT Goal Formulation: With patient Time For Goal Achievement: 06/26/18 Potential to Achieve Goals: Fair ADL Goals Pt Will Perform Eating: with set-up Pt Will Perform Grooming: with set-up Pt Will Perform Upper Body Bathing: with set-up;with supervision Pt Will  Perform Upper Body Dressing: with set-up;with supervision  Plan Discharge plan remains appropriate    Co-evaluation    PT/OT/SLP Co-Evaluation/Treatment: Yes Reason for Co-Treatment: Complexity of the patient's impairments (multi-system involvement);Necessary to address cognition/behavior during functional activity;For patient/therapist safety;To address functional/ADL transfers   OT goals addressed during session: Proper use of Adaptive equipment and DME;ADL's and self-care;Strengthening/ROM      AM-PAC PT "6 Clicks" Daily Activity     Outcome Measure   Help from another person eating meals?: A Lot Help from another person taking care of personal grooming?: A Lot Help from another person toileting, which includes using toliet, bedpan, or urinal?: Total Help from another person bathing (including washing, rinsing, drying)?: A Lot Help from another person to put on and taking off regular upper body clothing?: A Lot Help from another person to put on and taking off regular lower body clothing?: Total 6 Click Score: 10    End of Session Equipment Utilized During Treatment: Cervical collar  OT Visit Diagnosis: Other symptoms and signs involving cognitive function;Feeding difficulties (R63.3);Muscle weakness (generalized) (M62.81);Unsteadiness on feet (R26.81);Hemiplegia and hemiparesis;Cognitive communication deficit (R41.841) Hemiplegia - Right/Left: Left Hemiplegia - dominant/non-dominant: Non-Dominant   Activity Tolerance Patient tolerated treatment well   Patient Left in chair;with call bell/phone within reach;with chair alarm set;with family/visitor present   Nurse Communication Mobility status;Need for lift equipment;Precautions        Time: 1424(1423)-1450 OT Time Calculation (min): 26 min  Charges: OT General Charges $OT Visit: 1 Visit OT Treatments $Self Care/Home Management : 8-22 mins   Jeri Modena, OTR/L  Acute Rehabilitation Services Pager:  [phone] Office: [phone] .    Parke Poisson B 06/20/2018, 3:48 PM
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
## 2018-06-20 NOTE — Social Work (Signed)
Pt's insurance company is requesting PT/OT clinicals, last clinicals were dated 8/30. When received will send.   Alexander Mt, [place] Work [phone]
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
## 2018-06-20 NOTE — Social Work (Signed)
Updated clinicals requested for insurance authorization.   CSW continuing to follow.  Alexander Mt, [place] Work [phone]

## 2018-06-20 NOTE — Progress Notes (Signed)
Inpatient Diabetes Program Recommendations Results for Kendra [name], Kendra [name] (MRN [id number]) as of 06/20/2018 10:19  Ref. Range 06/19/2018 16:22 06/19/2018 19:43 06/19/2018 23:02 06/20/2018 04:42 06/20/2018 08:04  Glucose-Capillary Latest Ref Range: 70 - 99 mg/dL 277 (H) 290 (H) 234 (H) 210 (H) 231 (H)   AACE/ADA: New Consensus Statement on Inpatient Glycemic Control (2015)  Target Ranges:  Prepandial:   less than 140 mg/dL      Peak postprandial:   less than 180 mg/dL (1-2 hours)      Critically ill patients:  140 - 180 mg/dL   Lab Results  Component Value Date   GLUCAP 231 (H) 06/20/2018   HGBA1C 6.3 (H) 05/02/2018    Review of Glycemic Control  Diabetes history: Type 2 Outpatient Diabetes medications: Janumet XR Current orders for Inpatient glycemic control: none  Inpatient Diabetes Program Recommendations:    Noted that blood sugars have been greater than 180 mg/dl.  Recommend starting Novolog MODERATE (0-15 units) TID & HS if blood sugars continue to be elevated and on steroids.    Harvel Ricks RN BSN CDE Diabetes Coordinator Pager: [phone]  8am-5pm

## 2018-06-20 NOTE — Progress Notes (Signed)
Bilateral lower extremity venous duplex has been completed. Negative for DVT.   06/20/18 1:03 PM Kendra [name] RVT

## 2018-06-20 NOTE — Progress Notes (Signed)
Neurosurgery Progress Note  No issues overnight.  Was started back on dysphagia diet yesterday. Tolerating well. Complains of mild neck soreness.  EXAM:  BP 135/71   Pulse 94   Temp 98.5 F (36.9 C)   Resp (!) 26   Ht 5\' 4"  (1.626 m)   Wt 90.7 kg   LMP  (LMP Unknown) Comment: reports periods come annd go.    SpO2 98%   BMI 34.33 kg/m   Awake, alert Dysarthric, dysphonia LUE spastic hemiparesis MAEW well Incision: bruising  PLAN Doing well this am. Tolerating dysphagia diet. Will continue steroids and wean off at discharge Adjusted insulin per DM coordinator recs. Appreciate assistance. Cleared from NS perspective for SNF when bed available

## 2018-06-21 LAB — GLUCOSE, CAPILLARY
GLUCOSE-CAPILLARY: 206 mg/dL — AB (ref 70–99)
GLUCOSE-CAPILLARY: 244 mg/dL — AB (ref 70–99)
GLUCOSE-CAPILLARY: 230 mg/dL — AB (ref 70–99)
GLUCOSE-CAPILLARY: 289 mg/dL — AB (ref 70–99)

## 2018-06-21 MED ORDER — HYDROCODONE-ACETAMINOPHEN 5-325 MG PO TABS: 1.0000 | ORAL_TABLET | ORAL | 0 refills | Status: DC | PRN

## 2018-06-21 MED ORDER — METHYLPREDNISOLONE 4 MG PO TBPK: ORAL_TABLET | ORAL | 0 refills | Status: DC

## 2018-06-21 MED FILL — Thrombin (Recombinant) For Soln 20000 Unit: CUTANEOUS | Qty: 1 | Status: AC

## 2018-06-21 NOTE — Care Management Note (Signed)
Case Management Note  Patient Details  Name: ARNELL MAUSOLF MRN: [id number] Date of Birth: April 16, 1961  Subjective/Objective:   Pt is a 57 y.o. 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 Over the course of several weeks she started to develop progressive weakness of the right arm, incoordination including inability to safely feed herself, and loss of ability to safely transfer in and out of the car and from bed to her wheelchair.  An MRI of her cervical spine was ordered which showed cervical stenosis with myelopathy.  She underwent ACDF 06-15-18. PTA, pt resided at home with niece, and needed assistance with all ADLS.                Action/Plan: PT/OT recommending SNF at dc.  CSW following to facilitate dc to SNF upon medical stability, pending insurance authorization for SNF.    Expected Discharge Date:  06/21/18               Expected Discharge Plan:  Skilled Nursing Facility  In-House Referral:  Clinical Social Work  Discharge planning Services  CM Consult  Post Acute Care Choice:    Choice offered to:     DME Arranged:    DME Agency:     HH Arranged:    [place] Agency:     Status of Service:  Completed, signed off  If discussed at [employer], dates discussed:    Additional Comments:  Ella Bodo, RN 06/21/2018, 10:26 AM

## 2018-06-21 NOTE — Discharge Summary (Addendum)
Physician Discharge Summary  Patient ID: [name] MRN: [id number] DOB/AGE: 1961-03-30 57 y.o.  Admit date: 06/15/2018 Discharge date: 06/21/2018  Admission Diagnoses:  Cervical stenosis with myelopathy  Discharge Diagnoses:  Same Active Problems:   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[name])   Pressure injury of skin   Discharged Condition: Stable  Hospital Course:  Kendra [name] is a 57 y.o. female who was admitted for the below procedure. Post op complicated by dysphagia. Was placed NPO x2-3 days. Started on steroids. Resumed dysphagia 1 diet without difficulties. At time of discharge, pain was well controlled, ambulating with Pt/OT, tolerating po, voiding normal. Ready for discharge.   Treatments: Surgery - C3-4, C4-5 ACDF  Discharge Exam: Blood pressure (!) 190/86, pulse 74, temperature 98.3 F (36.8 C), temperature source Oral, resp. rate (!) 25, height 5\' 4"  (1.626 m), weight 90.7 kg, SpO2 94 %. Awake, alert, oriented Speech fluent, appropriate CN grossly intact LUE spastic hemiparesis RUE/RLE/LLE: moves well Wound c/d/i  Disposition: SNF  Discharge Instructions    Call MD for:  difficulty breathing, headache or visual disturbances   Complete by:  As directed    Call MD for:  persistant dizziness or light-headedness   Complete by:  As directed    Call MD for:  redness, tenderness, or signs of infection (pain, swelling, redness, odor or green/yellow discharge around incision site)   Complete by:  As directed    Call MD for:  severe uncontrolled pain   Complete by:  As directed    Call MD for:  temperature >100.4   Complete by:  As directed    Diet general   Complete by:  As directed    Driving Restrictions   Complete by:  As directed    Do not drive until given clearance.   Increase activity slowly   Complete by:  As directed    Lifting restrictions   Complete by:  As directed    Do not lift anything >10lbs. Avoid bending and twisting in  awkward positions. Avoid bending at the back.   May shower / Bathe   Complete by:  As directed    In 24 hours. Okay to wash wound with warm soapy water. Avoid scrubbing the wound. Pat dry.   Remove dressing in 24 hours   Complete by:  As directed      Allergies as of 06/21/2018      Reactions   Gabapentin Other (See Comments)   Causes a lot of falls   Bactrim Other (See Comments)   "out of it"   Darvocet [propoxyphene N-acetaminophen] Nausea And Vomiting   Latex Rash   Penicillins Nausea And Vomiting   Has patient had a PCN reaction causing immediate rash, facial/tongue/throat swelling, SOB or lightheadedness with hypotension: No Has patient had a PCN reaction causing severe rash involving mucus membranes or skin necrosis: No  Has patient had a PCN reaction that required hospitalization: No Has patient had a PCN reaction occurring within the last 10 years: No If all of the above answers are "NO", then may proceed with Cephalosporin use. Tolerates amoxicillin    Percocet [oxycodone-acetaminophen] Rash   Synvisc [hylan G-f 20] Anxiety, Other (See Comments)   "JITTERY"   Ultracet [tramadol-acetaminophen] Rash      Medication List    TAKE these medications   CALCIUM-VITAMIN D PO Take 1 tablet by mouth 2 (two) times daily.   diclofenac sodium 1 % Gel Commonly known as:  VOLTAREN Apply 1 application  topically 2 (two) times daily as needed (pain). Apply to shoulders, back and knees   fluticasone 50 MCG/ACT nasal spray Commonly known as:  FLONASE Place 1 spray into both nostrils daily as needed for allergies.   HYDROcodone-acetaminophen 5-325 MG tablet Commonly known as:  NORCO/VICODIN Take 1 tablet by mouth every 4 (four) hours as needed for moderate pain ((score 4 to 6)).   ibuprofen 600 MG tablet Commonly known as:  ADVIL,MOTRIN Take 600 mg by mouth 4 (four) times daily.   JANUMET XR 50-500 MG Tb24 Generic drug:  SitaGLIPtin-MetFORMIN HCl Take 1 tablet by mouth daily.    lamoTRIgine 100 MG tablet Commonly known as:  LAMICTAL TAKE 1/2 TABLET EVERY MORNING  AND TAKE 1 TABLET EVERY EVENING What changed:    how much to take  how to take this  when to take this   lisinopril 10 MG tablet Commonly known as:  PRINIVIL,ZESTRIL Take 10 mg by mouth daily.   loratadine 10 MG tablet Commonly known as:  CLARITIN Take 10 mg by mouth daily as needed for allergies.   Magnesium Citrate Powd Take 1 Dose by mouth 2 (two) times daily. In 8 oz of water   Melatonin 1 MG Caps Take 1 capsule by mouth at bedtime as needed (sleep).   methylPREDNISolone 4 MG Tbpk tablet Commonly known as:  MEDROL DOSEPAK Take according to package inserts   omeprazole 20 MG capsule Commonly known as:  PRILOSEC Take 20 mg by mouth daily.   OVER THE COUNTER MEDICATION Take 1 tablet by mouth at bedtime as needed (sleep). CBD tablets   propranolol 20 MG tablet Commonly known as:  INDERAL Take 2 tablets (40 mg total) by mouth 3 (three) times daily. What changed:  how much to take   sertraline 50 MG tablet Commonly known as:  ZOLOFT Take 50 mg by mouth daily.   tiZANidine 4 MG tablet Commonly known as:  ZANAFLEX Take 4 mg by mouth at bedtime as needed for muscle spasms.   trihexyphenidyl 2 MG tablet Commonly known as:  ARTANE Take 1-2 mg by mouth See admin instructions. 1 mg in the morning and afternoon, 2 mg at bedtime      Follow-up Information    Consuella Lose, MD. Schedule an appointment as soon as possible for a visit in 3 week(s).   Specialty:  Neurosurgery Contact information: [address] [place] [address] [phone]           Signed: Traci Sermon 06/21/2018, 8:36 AM

## 2018-06-21 NOTE — Social Work (Signed)
Spoke with pt's sister Alsie, pt does not have any additional offers in [place], requested pt sister look over offers given from [place]. Pt sister amenable.   Authorization pending through [facility].  Alexander Mt, [place] Work [phone]

## 2018-06-21 NOTE — Consult Note (Addendum)
            [facility] CM Primary Care Navigator  06/21/2018  Kendra [name] [facility] 09-23-1961 [id number]   Went to seepatient at the bedside to identify possible discharge needs. Patient statesthat she had "trouble swallowing, pain/ weakness to right arm and recurrent falls. (cervical stenosis with myelopathystatus post anterior decompression/ discectomy fusion C3-C4, C4-C5).  Patient endorsesDr.Pamela Penner with [place] primary care provider. Patient mentioned that she may possibly be seeing another provider soon.   [facility] [address] [place] and [employer] [facility] service to obtain medications without any problem.   Patientreports that niece Kendra [name]) has beenmanaginghermedications at home using"pill box" system filled every week.  Patient states thatniece had been providing transportation to her doctors' appointments. [employer] transportation benefits explained to patient.  Patientverbalized that she lives with niece who serves as her primary caregiver at home.  Anticipated discharge plan isskilled nursing facility (SNF- in process) for rehabilitation per therapy recommendation.  Patient voiced understanding to call primary care provider's officeonce she gets back home,for a post discharge follow-up appointment within a1- 2 weeksor sooner if needs arise.Patient letter (with PCP's contact number) wasprovided as a reminder.  Discussed with patientregarding [facility] CM services available for health managementand resourcesat homeand she has indicated interest for it. Encouraged patient to talk with primary care provider on her next visit after coming home, for further needs and assistance in managing her health conditions at home. Patientvoicedunderstandingto seekreferralfrom primary care provider to [facility] care management ifdeemed necessary and appropriatefor services in the nearfuture- onceshereturnsback  home. Patient was seen by Inpatient DM coordinator during this admission.  [facility] care management information was provided for future needsthatshemay have.    Addendum (06/22/18)  Was able to talk to patient's sister Kendra [name]) over the phone and reiterated information discussed with patient yesterday. Sister was grateful for provided information and states that there's a plan to enroll [facility] with [facility] but will not happen soon. Sister verbalized that patient will follow-up with primary care provider and neurosurgeon after discharge.  Sister also mentioned that patient's recent A1c is 6 which is being monitored by provider every 3 months. Expressed awareness of ways to maintain patient's blood sugar.   For additional questions please contact:  Edwena Felty A. Ashlay Altieri, BSN, RN-BC [facility] PRIMARY CARE Navigator Cell: [phone]
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
## 2018-06-21 NOTE — Progress Notes (Signed)
Neurosurgery Progress Note  No issues overnight. Eager for d/c  EXAM:  BP (!) 190/86   Pulse 74   Temp 98.3 F (36.8 C) (Oral)   Resp (!) 25   Ht 5\' 4"  (1.626 m)   Wt 90.7 kg   LMP  (LMP Unknown) Comment: reports periods come annd go.    SpO2 94%   BMI 34.33 kg/m   Awake, alert, oriented  Speech fluent, appropriate  Stable motor exam  PLAN Stable this am Cleared medically for SNFF Awaiting placement

## 2018-06-21 NOTE — Social Work (Signed)
Pt has been authorized for admissions to [facility], which is pt's sister's first choice from list of SNF offers. Pt authorized to discharge tomorrow to ensure SNF can fill medications.   Vinny,PA is aware/amenable to discharge tomorrow morning.   Josem Kaufmann #[id number], approved starting today (9/4) with review on 9/6.  Approved at rug level RVC 568 minutes, with additional clinicals requested on review date from SNF.   CSW will arrange discharge tomorrow at 62.   Alexander Mt, [place] Work [phone]
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
## 2018-06-22 LAB — GLUCOSE, CAPILLARY
GLUCOSE-CAPILLARY: 190 mg/dL — AB (ref 70–99)
Glucose-Capillary: 184 mg/dL — ABNORMAL HIGH (ref 70–99)

## 2018-06-22 NOTE — Social Work (Signed)
Kendra [name] #[id number], approved starting today (9/5) with review on 9/7.  Approved at rug level RVC 568 minutes, with additional clinicals requested on review date from SNF.   Clinical Social Worker facilitated patient discharge including contacting patient family and facility to confirm patient discharge plans.  Clinical information faxed to facility and family agreeable with plan.  CSW arranged ambulance transport via [facility] to [facility].   RN to call [phone] with report  prior to discharge.  Clinical Social Worker will sign off for now as social work intervention is no longer needed. Please consult [place] again if new need arises.  Alexander Mt, [place] Social Worker

## 2018-06-22 NOTE — Clinical Social Work Placement (Signed)
   CLINICAL SOCIAL WORK PLACEMENT  NOTE [facility] RN to call report to [phone]  Date:  06/22/2018  Patient Details  Name: Kendra [name] MRN: [id number] Date of Birth: Jul 22, 1961  Clinical Social Work is seeking post-discharge placement for this patient at the [place] level of care (*CSW will initial, date and re-position this form in  chart as items are completed):  Yes   Patient/family provided with [place] Work Department's list of facilities offering this level of care within the geographic area requested by the patient (or if unable, by the patient's family).  Yes   Patient/family informed of their freedom to choose among providers that offer the needed level of care, that participate in Medicare, Medicaid or managed care program needed by the patient, have an available bed and are willing to accept the patient.  Yes   Patient/family informed of [facility]'s ownership interest in [facility] and [facility], as well as of the fact that they are under no obligation to receive care at these facilities.  PASRR submitted to EDS on       PASRR number received on       Existing PASRR number confirmed on 06/19/18     FL2 transmitted to all facilities in geographic area requested by pt/family on 06/19/18     FL2 transmitted to all facilities within larger geographic area on       Patient informed that his/her managed care company has contracts with or will negotiate with certain facilities, including the following:        Yes   Patient/family informed of bed offers received.  Patient chooses bed at [facility]     Physician recommends and patient chooses bed at      Patient to be transferred to [facility] on 06/22/18.  Patient to be transferred to facility by [facility]     Patient family notified on 06/22/18 of transfer.  Name of family member notified:  sister [name]     PHYSICIAN       Additional  Comment:    _______________________________________________ Alexander Mt, [place] 06/22/2018, 11:58 AM

## 2018-06-22 NOTE — Social Work (Signed)
CSW has spoken with [facility] and pt family, pt family would prefer in network benefit through another SNF. Pt family has spoken with [facility] liaison, they are amenable to changing authorization to that SNF. CSW called [employer] and spoke with care manager, changing facility to family preference.   Alexander Mt, [place] Work [phone]

## 2018-09-19 IMAGING — RF DG SWALLOWING FUNCTION - NRPT [facility]
13 of 16 series · 13 of 24 positions shown · non-contrast
Comparison: none

[Series 1: run · 1 of 20 frames shown (1 of 13)]
[frame 1/20]
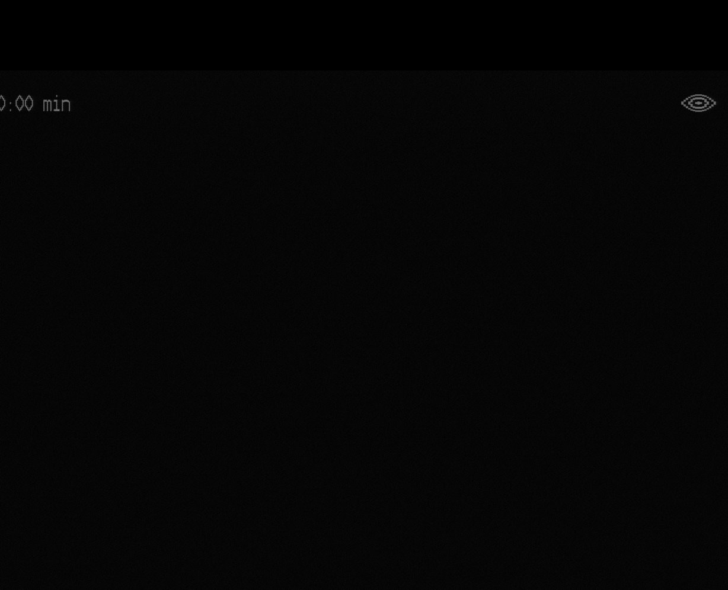

[Series 2: run · 1 of 46 frames shown (2 of 13)]
[frame 24/46]
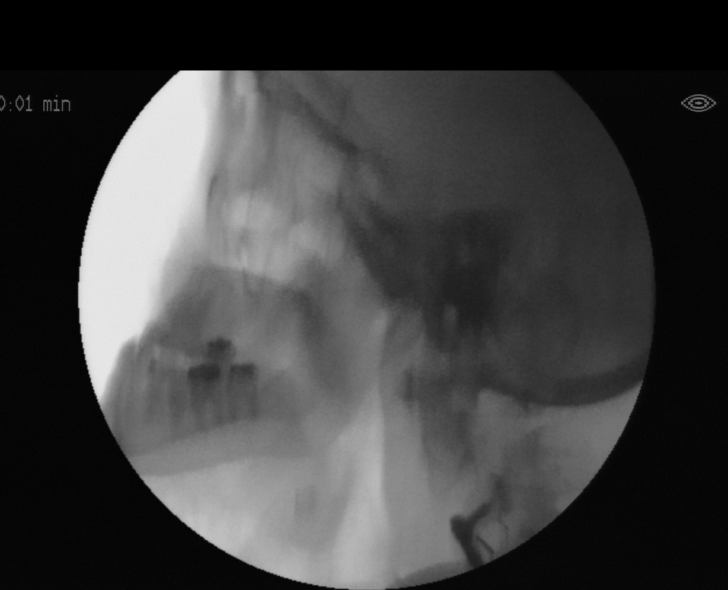

[Series 3: run · 1 of 96 frames shown (3 of 13)]
[frame 82/96]
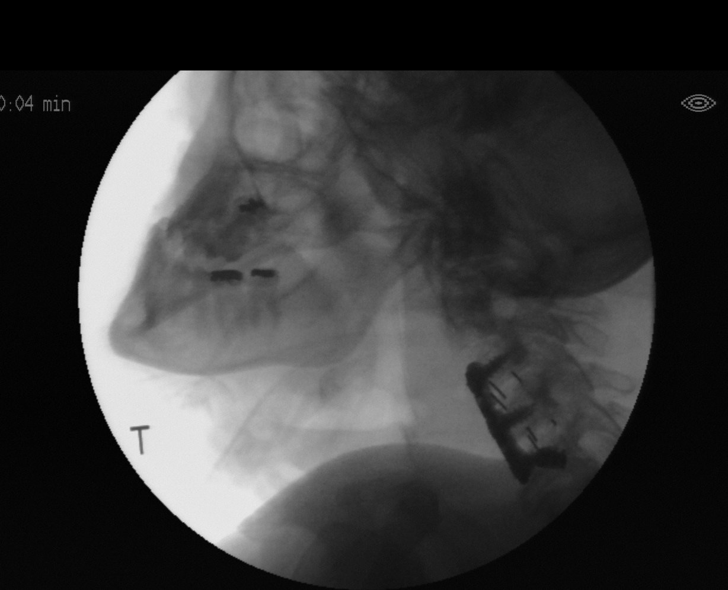

[Series 5: run · 1 of 132 frames shown (4 of 13)]
[frame 9/132]
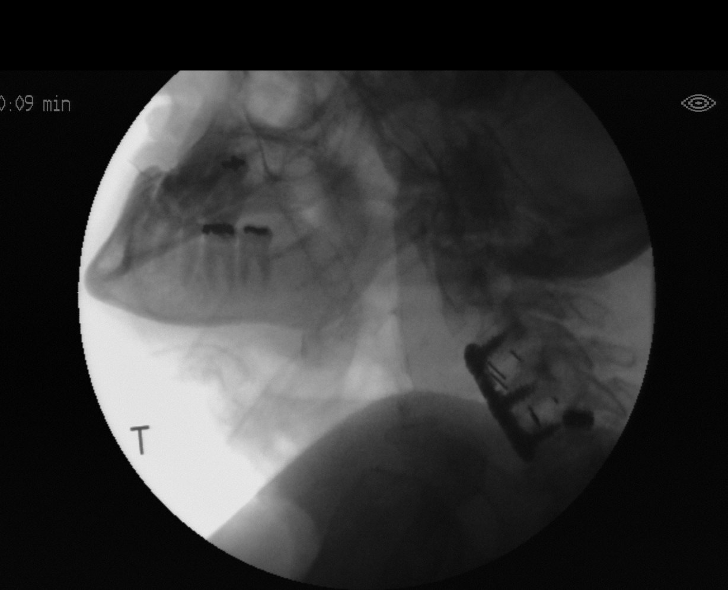

[Series 6: run · 1 of 103 frames shown (5 of 13)]
[frame 52/103]
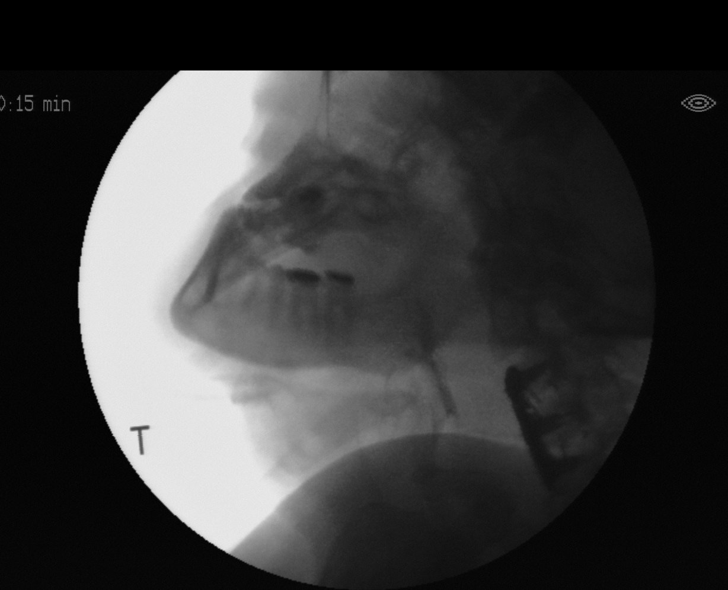

[Series 7: run · 1 of 155 frames shown (6 of 13)]
[frame 132/155]
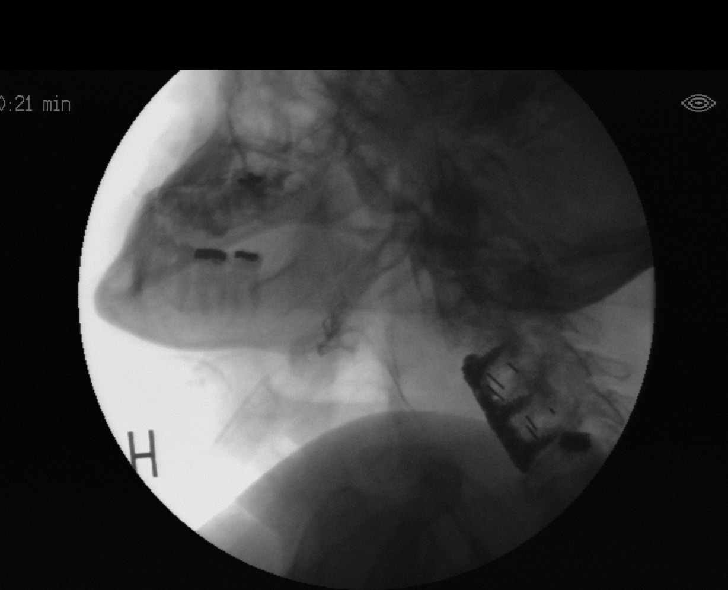

[Series 9: run · 1 of 192 frames shown (7 of 13)]
[frame 97/192]
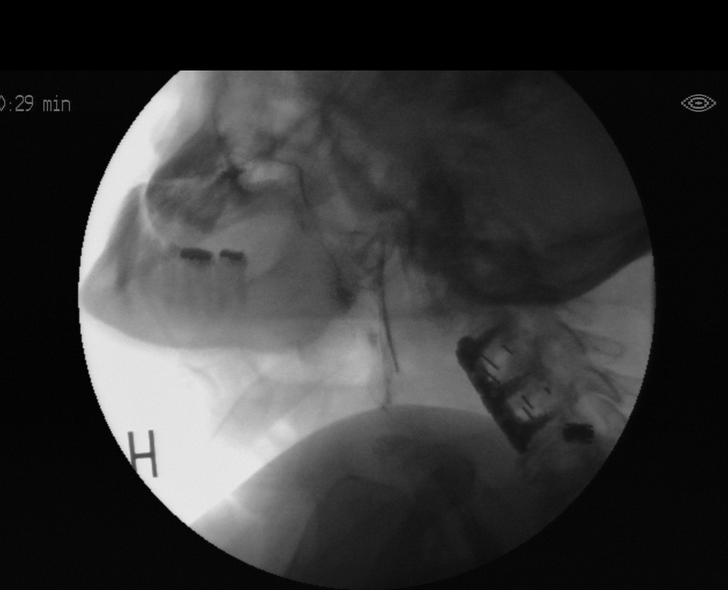

[Series 10: run · 1 of 256 frames shown (8 of 13)]
[frame 39/256]
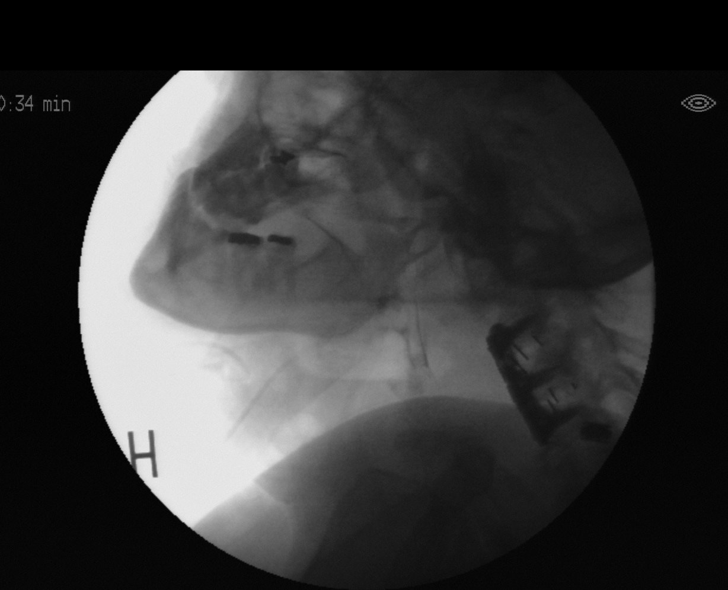

[Series 11: run · 1 of 156 frames shown (9 of 13)]
[frame 24/156]
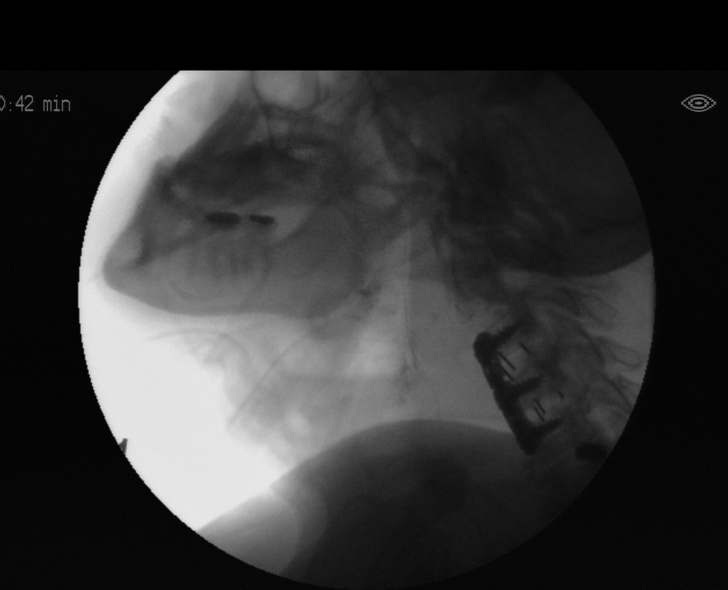

[Series 12: run · 1 of 287 frames shown (10 of 13)]
[frame 244/287]
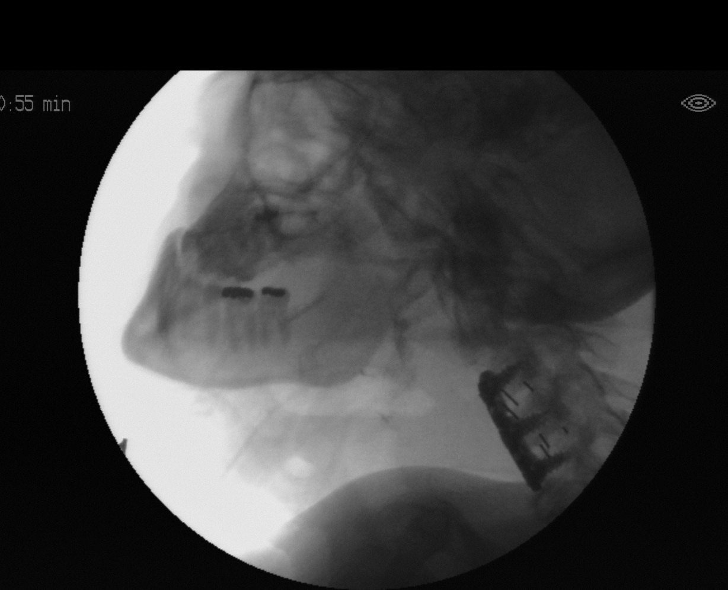

[Series 14: run · 1 of 528 frames shown (11 of 13)]
[frame 1/528]
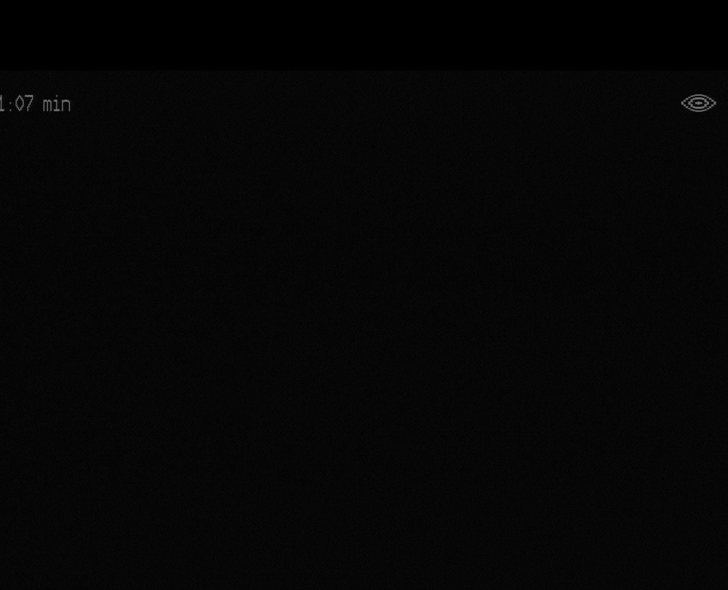

[Series 15: run · 1 of 333 frames shown (12 of 13)]
[frame 167/333]
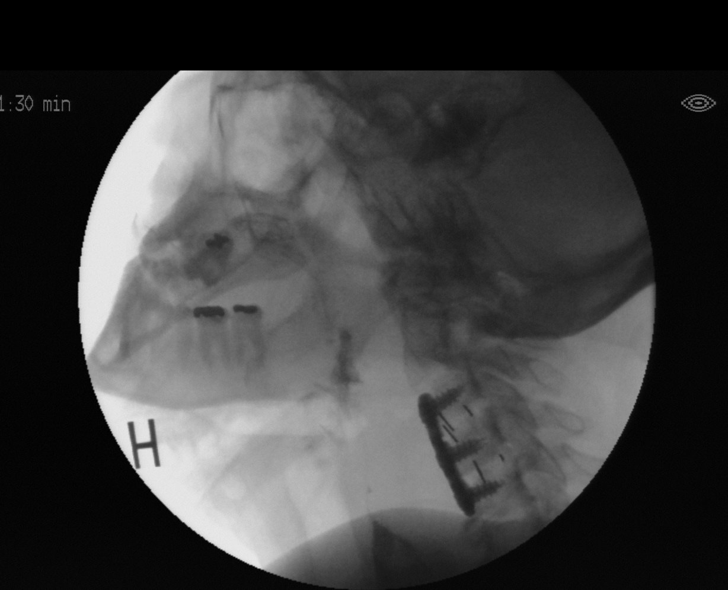

[Series 16: run · 1 of 232 frames shown (13 of 13)]
[frame 198/232]
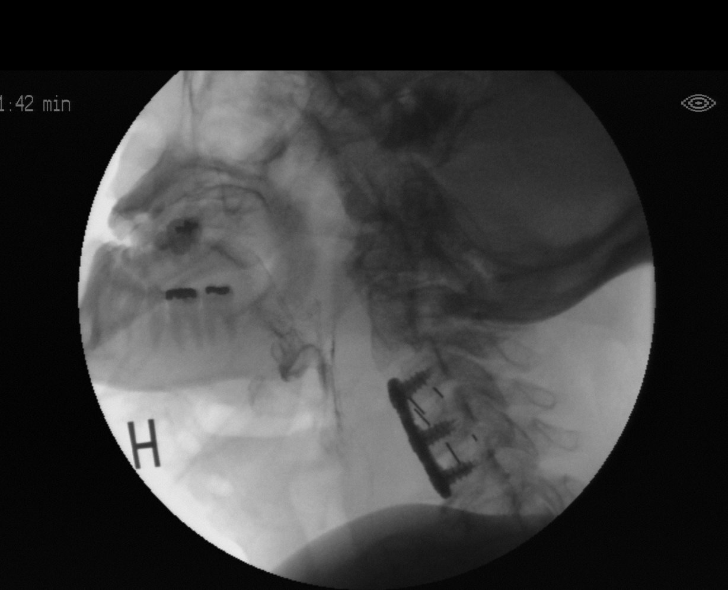

[13 of 24 positions shown; findings below may reference images not displayed]

FLUOROSCOPY FOR SWALLOWING FUNCTION STUDY:
Fluoroscopy was provided for swallowing function study, which was administered by a speech pathologist.  Final results and recommendations from this study are contained within the speech pathology report.

## 2019-12-27 ENCOUNTER — Other Ambulatory Visit (HOSPITAL_COMMUNITY): Payer: Self-pay | Admitting: Adult Health Nurse Practitioner

## 2019-12-27 ENCOUNTER — Telehealth (HOSPITAL_COMMUNITY): Payer: Self-pay | Admitting: Adult Health Nurse Practitioner

## 2019-12-27 ENCOUNTER — Encounter (HOSPITAL_COMMUNITY): Payer: Self-pay

## 2019-12-27 DIAGNOSIS — R252 Cramp and spasm: Secondary | ICD-10-CM

## 2019-12-27 NOTE — Telephone Encounter (Signed)
Patient called to cancel, per she will have COVID-19 test on 01/12/20 in [place] & will fax results by 3 pm on Monday 01/14/20 . Patient stated that she s/w [facility] earlier regarding having test outside of [facility] and was told it is ok as long as results are rcvd/faxed to carrie by Monday.

## 2020-01-12 ENCOUNTER — Ambulatory Visit (HOSPITAL_COMMUNITY): Payer: Medicare PPO

## 2020-01-14 ENCOUNTER — Other Ambulatory Visit: Payer: Self-pay

## 2020-01-14 ENCOUNTER — Encounter (HOSPITAL_COMMUNITY): Payer: Self-pay | Admitting: *Deleted

## 2020-01-14 NOTE — Progress Notes (Addendum)
I called and spoke to Kendra [name]. Kendra [name]' sister. Kendra [name] does not live with Kendra [name], she lives with [name].  Kendra [name] administers Kendra [name] medications, majority of the medication and in pre- prepared -packages from Kendra [name].  I called the clinic at [facility] and left a voice message asking for medication list to be faxed as well as the Covid test that was done on Thursday. I spoke to Kendra [name]  , patient's niece who I asked if patient had been in quarantine since the test on Thursday, she said "no, one told us, we took patient out to eat Friday night,and she went to [facility] Friday and today. Kendra [name] said that is having a MRI of her back, because she has horrible back pain and side pain. , and it might be a kidney stone, and she is bleeding, we do not know if it is from her vagaina or her bladder, She wipes herself. Kendra [name] said that she was going to call the Kendra [name] and see if they can look and see whether she is bleeding from I called [facility] and spoke to Kendra [name] in the [place] and asked if  Patient's eat together there, she said yes,but they are 6 feet apart and I as we screen them all the time.  Patient rides a bus to and  from the Center. Kendra [name] said she would like me to speak to the Director of Center, I spoke to Kendra [name], who repeated what Kendra [name] said and more about the way they keep clinets safe. Kendra [name] said that no one told them that Kendra [name] had to quarantine. I told Kendra [name] that we had patient scheduled for testing at our testing site on Saturday and information would be given there. Kendra [name] said that she is a lot for patient to handle to come there for a quick test Kendra [name] said that this MRI of the brain needs to be done ASAP, because Kendra. [name] is having new increased spasms with posturing. spasms and posturing .I told Kendra [name] that I would speak to Kendra [name] and inform him about patient breaking quarantine. I asked Kendra [name] to call me when  she received Covid test. Kendra [name] said that if test that was done at the Center was positive, the MRI would be cancelled, if the test is negative that we would bring her in 3 hours earlier and retest.  I called Kendra [name], who said that the test was negative, I told Kendra [name] that we would bring patient in 3 hours early and patient would be retested.  I called Kendra [name] and told her that patient needs to arrive at 0700, Kendra [name] asked why 3 hours early and I explained.  I began to inform Kendra [name] what medicationst o give her aunt in am, "I am not given her any, she takes about 82, since she is having anesthesia, I think she doesn't need them.  I said that patient does not have to take all of the meds,but I encouragecd her to give Kendra [name] the Lamictal and Propanolol , I explained to Kendra [name] the reason to take these medications.  I said do not give patient medication for diabetes and do not give her Lisonipril.  Kendra [name] said again, "I am not giving her any medication."  Kendra [name] does not have a CBG machine.

## 2020-01-15 ENCOUNTER — Ambulatory Visit (HOSPITAL_COMMUNITY)
Admission: RE | Admit: 2020-01-15 | Discharge: 2020-01-15 | Disposition: A | Discharge status: Home / Self Care | Payer: No Typology Code available for payment source | Source: Ambulatory Visit | Attending: Adult Health Nurse Practitioner | Admitting: Adult Health Nurse Practitioner

## 2020-01-15 ENCOUNTER — Encounter (HOSPITAL_COMMUNITY): Payer: Self-pay

## 2020-01-15 ENCOUNTER — Other Ambulatory Visit: Payer: Self-pay

## 2020-01-15 ENCOUNTER — Ambulatory Visit (HOSPITAL_COMMUNITY): Payer: No Typology Code available for payment source | Admitting: Anesthesiology

## 2020-01-15 ENCOUNTER — Encounter (HOSPITAL_COMMUNITY)
Admission: RE | Disposition: A | Discharge status: Home / Self Care | Payer: Self-pay | Source: Home / Self Care | Attending: Adult Health Nurse Practitioner

## 2020-01-15 ENCOUNTER — Ambulatory Visit (HOSPITAL_COMMUNITY)
Admission: RE | Admit: 2020-01-15 | Discharge: 2020-01-15 | Disposition: A | Discharge status: Home / Self Care | Payer: No Typology Code available for payment source | Attending: Adult Health Nurse Practitioner | Admitting: Adult Health Nurse Practitioner

## 2020-01-15 DIAGNOSIS — F329 Major depressive disorder, single episode, unspecified: Secondary | ICD-10-CM | POA: Diagnosis not present

## 2020-01-15 DIAGNOSIS — K219 Gastro-esophageal reflux disease without esophagitis: Secondary | ICD-10-CM | POA: Insufficient documentation

## 2020-01-15 DIAGNOSIS — Z79899 Other long term (current) drug therapy: Secondary | ICD-10-CM | POA: Insufficient documentation

## 2020-01-15 DIAGNOSIS — M791 Myalgia, unspecified site: Secondary | ICD-10-CM | POA: Diagnosis not present

## 2020-01-15 DIAGNOSIS — M48061 Spinal stenosis, lumbar region without neurogenic claudication: Secondary | ICD-10-CM | POA: Diagnosis not present

## 2020-01-15 DIAGNOSIS — I10 Essential (primary) hypertension: Secondary | ICD-10-CM | POA: Diagnosis not present

## 2020-01-15 DIAGNOSIS — Z20822 Contact with and (suspected) exposure to covid-19: Secondary | ICD-10-CM | POA: Insufficient documentation

## 2020-01-15 DIAGNOSIS — R252 Cramp and spasm: Secondary | ICD-10-CM

## 2020-01-15 DIAGNOSIS — E119 Type 2 diabetes mellitus without complications: Secondary | ICD-10-CM | POA: Diagnosis not present

## 2020-01-15 DIAGNOSIS — G8929 Other chronic pain: Secondary | ICD-10-CM | POA: Diagnosis not present

## 2020-01-15 DIAGNOSIS — F419 Anxiety disorder, unspecified: Secondary | ICD-10-CM | POA: Diagnosis not present

## 2020-01-15 DIAGNOSIS — R9082 White matter disease, unspecified: Secondary | ICD-10-CM | POA: Insufficient documentation

## 2020-01-15 HISTORY — PX: RADIOLOGY WITH ANESTHESIA: SHX6223

## 2020-01-15 LAB — BASIC METABOLIC PANEL
Anion gap: 14 (ref 5–15)
BUN: 8 mg/dL (ref 6–20)
CO2: 26 mmol/L (ref 22–32)
Calcium: 9.7 mg/dL (ref 8.9–10.3)
Chloride: 97 mmol/L — ABNORMAL LOW (ref 98–111)
Creatinine, Ser: 0.69 mg/dL (ref 0.44–1.00)
GFR calc Af Amer: >60 mL/min (ref >60)
GFR calc non Af Amer: >60 mL/min (ref >60)
Glucose, Bld: 122 mg/dL — ABNORMAL HIGH (ref 70–99)
Potassium: 4.2 mmol/L (ref 3.5–5.1)
Sodium: 137 mmol/L (ref 135–145)

## 2020-01-15 LAB — GLUCOSE, CAPILLARY
Glucose-Capillary: 112 mg/dL — ABNORMAL HIGH (ref 70–99)
Glucose-Capillary: 112 mg/dL — ABNORMAL HIGH (ref 70–99)

## 2020-01-15 LAB — RESPIRATORY PANEL BY RT PCR (FLU A&B, COVID)
Influenza A by PCR: NEGATIVE
Influenza B by PCR: NEGATIVE
SARS Coronavirus 2 by RT PCR: NEGATIVE

## 2020-01-15 SURGERY — MRI WITH ANESTHESIA
Anesthesia: General

## 2020-01-15 MED ORDER — ONDANSETRON HCL 4 MG/2ML IJ SOLN
INTRAMUSCULAR | Status: DC | PRN
  Administered 2020-01-15: 4 mg via INTRAVENOUS

## 2020-01-15 MED ORDER — FENTANYL CITRATE (PF) 100 MCG/2ML IJ SOLN
INTRAMUSCULAR | Status: DC | PRN
  Administered 2020-01-15: 25 ug via INTRAVENOUS

## 2020-01-15 MED ORDER — ROCURONIUM BROMIDE 50 MG/5ML IV SOSY
PREFILLED_SYRINGE | INTRAVENOUS | Status: DC | PRN
  Administered 2020-01-15: 40 mg via INTRAVENOUS

## 2020-01-15 MED ORDER — SUGAMMADEX SODIUM 200 MG/2ML IV SOLN
INTRAVENOUS | Status: DC | PRN
  Administered 2020-01-15: 150 mg via INTRAVENOUS

## 2020-01-15 MED ORDER — LACTATED RINGERS IV SOLN: INTRAVENOUS | Status: DC

## 2020-01-15 MED ORDER — DEXAMETHASONE SODIUM PHOSPHATE 4 MG/ML IJ SOLN
INTRAMUSCULAR | Status: DC | PRN
  Administered 2020-01-15: 10 mg via INTRAVENOUS

## 2020-01-15 MED ORDER — LIDOCAINE 2% (20 MG/ML) 5 ML SYRINGE
INTRAMUSCULAR | Status: DC | PRN
  Administered 2020-01-15: 60 mg via INTRAVENOUS

## 2020-01-15 MED ORDER — PROPOFOL 10 MG/ML IV BOLUS
INTRAVENOUS | Status: DC | PRN
  Administered 2020-01-15: 150 mg via INTRAVENOUS

## 2020-01-15 MED ORDER — MIDAZOLAM HCL 5 MG/5ML IJ SOLN
INTRAMUSCULAR | Status: DC | PRN
  Administered 2020-01-15: 2 mg via INTRAVENOUS

## 2020-01-15 NOTE — Anesthesia Preprocedure Evaluation (Signed)
Anesthesia Evaluation  Patient identified by MRN, date of birth, ID band Patient awake    Reviewed: Allergy & Precautions, NPO status , Patient's Chart, lab work & pertinent test results  Airway Mallampati: II  TM Distance: >3 FB Neck ROM: Full    Dental  (+) Teeth Intact, Dental Advisory Given   Pulmonary    breath sounds clear to auscultation       Cardiovascular hypertension, Pt. on medications  Rhythm:Regular Rate:Normal     Neuro/Psych  Headaches, Seizures -,  Anxiety Depression  Neuromuscular disease    GI/Hepatic Neg liver ROS, GERD  Medicated,  Endo/Other  diabetes, Type 2, Oral Hypoglycemic Agents  Renal/GU negative Renal ROS     Musculoskeletal  (+) Arthritis ,   Abdominal Normal abdominal exam  (+)   Peds  Hematology   Anesthesia Other Findings   Reproductive/Obstetrics negative OB ROS                             Anesthesia Physical  Anesthesia Plan  ASA: III  Anesthesia Plan: General   Post-op Pain Management:    Induction: Intravenous  PONV Risk Score and Plan: 4 or greater and Ondansetron, Dexamethasone, Midazolam and Scopolamine patch - Pre-op  Airway Management Planned: Video Laryngoscope Planned and Oral ETT  Additional Equipment: None  Intra-op Plan:   Post-operative Plan: Extubation in OR  Informed Consent: I have reviewed the patients History and Physical, chart, labs and discussed the procedure including the risks, benefits and alternatives for the proposed anesthesia with the patient or authorized representative who has indicated his/her understanding and acceptance.     Dental advisory given  Plan Discussed with: CRNA  Anesthesia Plan Comments:         Anesthesia Quick Evaluation

## 2020-01-15 NOTE — Anesthesia Procedure Notes (Signed)
Procedure Name: Intubation Date/Time: 01/15/2020 10:30 AM Performed by: Griffin Dakin, CRNA Pre-anesthesia Checklist: Patient identified, Emergency Drugs available, Suction available and Patient being monitored Patient Re-evaluated:Patient Re-evaluated prior to induction Oxygen Delivery Method: Circle system utilized Preoxygenation: Pre-oxygenation with 100% oxygen Induction Type: IV induction Ventilation: Mask ventilation without difficulty and Oral airway inserted - appropriate to patient size Laryngoscope Size: Glidescope and 3 Grade View: Grade I Tube type: Oral Tube size: 7.0 mm Number of attempts: 1 Airway Equipment and Method: Oral airway,  Video-laryngoscopy and Rigid stylet Placement Confirmation: ETT inserted through vocal cords under direct vision,  positive ETCO2 and breath sounds checked- equal and bilateral Secured at: 22 cm Tube secured with: Tape Dental Injury: Teeth and Oropharynx as per pre-operative assessment

## 2020-01-15 NOTE — Transfer of Care (Signed)
Immediate Anesthesia Transfer of Care Note  Patient: Kendra [name]  Procedure(s) Performed: MRI WITH ANESTHESIA CERVICAL WITHOUT CONTRAST AND BRAIN WITHOUT CONTRAST LUMBAR SPINE WITH CONTRAST (N/A )  Patient Location: PACU  Anesthesia Type:General  Level of Consciousness: awake, alert  and oriented  Airway & Oxygen Therapy: Patient Spontanous Breathing  Post-op Assessment: Report given to RN and Post -op Vital signs reviewed and stable  Post vital signs: Reviewed and stable  Last Vitals:  Vitals Value Taken Time  BP 131/68 01/15/20 1159  Temp    Pulse 82 01/15/20 1202  Resp 13 01/15/20 1202  SpO2 93 % 01/15/20 1202  Vitals shown include unvalidated device data.  Last Pain:  Vitals:   01/15/20 0826  TempSrc: Oral  PainSc:       Patients Stated Pain Goal: 3 ([id number])  Complications: No apparent anesthesia complications

## 2020-01-15 NOTE — Progress Notes (Signed)
Spoke with Dr. Deatra Canter about patients propranolol.  Patient did not take her home dose this morning.  Dr. Ola Spurr requested we hold off on giving her home dose until he evaluates her.  Will continue to monitor patient

## 2020-01-16 ENCOUNTER — Encounter: Payer: Self-pay | Admitting: *Deleted

## 2020-01-16 NOTE — Anesthesia Postprocedure Evaluation (Signed)
Anesthesia Post Note  Patient: Kendra [name]  Procedure(s) Performed: MRI WITH ANESTHESIA CERVICAL WITHOUT CONTRAST AND BRAIN WITHOUT CONTRAST LUMBAR SPINE WITH CONTRAST (N/A )     Patient location during evaluation: PACU Anesthesia Type: General Level of consciousness: awake and alert Pain management: pain level controlled Vital Signs Assessment: post-procedure vital signs reviewed and stable Respiratory status: spontaneous breathing, nonlabored ventilation, respiratory function stable and patient connected to nasal cannula oxygen Cardiovascular status: blood pressure returned to baseline and stable Postop Assessment: no apparent nausea or vomiting Anesthetic complications: no    Last Vitals:  Vitals:   01/15/20 1212 01/15/20 1226  BP: 127/65   Pulse: 84 84  Resp: 19 20  Temp:  36.7 C  SpO2: 96% 95%    Last Pain:  Vitals:   01/15/20 1226  TempSrc:   PainSc: 0-No pain                 Tiajuana Amass

## 2020-01-22 ENCOUNTER — Other Ambulatory Visit: Payer: Self-pay | Admitting: Neurosurgery

## 2020-01-22 DIAGNOSIS — M4626 Osteomyelitis of vertebra, lumbar region: Secondary | ICD-10-CM

## 2020-01-24 NOTE — Discharge Instructions (Signed)
Disc Aspiration Post Procedure Discharge Instructions ° °1. May resume a regular diet and any medications that you routinely take (including pain medications). °2. No driving day of procedure. °3. Upon discharge go home and rest for at least 4 hours.  May use an ice pack as needed to injection sites on back. °4. Remove bandades later, today. ° ° ° °Please contact our office at [phone] for the following symptoms: ° °· Fever greater than 100 degrees °· Increased swelling, pain, or redness at injection site. ° ° °Thank you for visiting [facility]. ° °  ° ° ° °    °

## 2020-01-25 ENCOUNTER — Other Ambulatory Visit: Payer: Self-pay

## 2020-01-25 ENCOUNTER — Ambulatory Visit
Admission: RE | Admit: 2020-01-25 | Discharge: 2020-01-25 | Disposition: A | Discharge status: Home / Self Care | Payer: No Typology Code available for payment source | Source: Ambulatory Visit | Attending: Neurosurgery | Admitting: Neurosurgery

## 2020-01-25 VITALS — BP 136/63 | HR 71 | Temp 98.9°F | Resp 20

## 2020-01-25 DIAGNOSIS — M4626 Osteomyelitis of vertebra, lumbar region: Secondary | ICD-10-CM

## 2020-01-25 MED ORDER — KETOROLAC TROMETHAMINE 30 MG/ML IJ SOLN: 30.0000 mg | Freq: Once | INTRAMUSCULAR | Status: DC

## 2020-01-25 MED ORDER — SODIUM CHLORIDE 0.9 % IV SOLN: INTRAVENOUS | Status: DC

## 2020-01-25 MED ORDER — FENTANYL CITRATE (PF) 100 MCG/2ML IJ SOLN
25.0000 ug | INTRAMUSCULAR | Status: DC | PRN
  Administered 2020-01-25 (×2): 25 ug via INTRAVENOUS

## 2020-01-25 MED ORDER — MIDAZOLAM HCL 2 MG/2ML IJ SOLN: 1.0000 mg | INTRAMUSCULAR | Status: DC | PRN

## 2020-01-25 MED ORDER — ONDANSETRON HCL 4 MG/2ML IJ SOLN: 4.0000 mg | Freq: Once | INTRAMUSCULAR | Status: DC

## 2020-01-25 NOTE — Progress Notes (Signed)
Summary of events: Pt given 25mg  of fentanyl twice for procedure per Dr. Vernard Gambles. Pt tolerated procedure well. Pt is currently awake and speaking clearly.

## 2020-01-28 ENCOUNTER — Encounter: Payer: Self-pay | Admitting: *Deleted

## 2020-01-30 ENCOUNTER — Ambulatory Visit: Payer: No Typology Code available for payment source | Admitting: Infectious Disease

## 2020-02-19 ENCOUNTER — Ambulatory Visit: Payer: No Typology Code available for payment source | Admitting: Physical Medicine & Rehabilitation

## 2020-02-25 ENCOUNTER — Ambulatory Visit: Payer: No Typology Code available for payment source | Admitting: Physical Medicine and Rehabilitation

## 2020-02-26 ENCOUNTER — Encounter
Payer: No Typology Code available for payment source | Attending: Physical Medicine and Rehabilitation | Admitting: Physical Medicine & Rehabilitation

## 2020-02-26 ENCOUNTER — Other Ambulatory Visit: Payer: Self-pay

## 2020-02-26 ENCOUNTER — Encounter: Payer: Self-pay | Admitting: Physical Medicine & Rehabilitation

## 2020-02-26 VITALS — BP 124/88 | HR 101 | Temp 96.6°F | Ht 64.0 in | Wt 168.0 lb

## 2020-02-26 DIAGNOSIS — M961 Postlaminectomy syndrome, not elsewhere classified: Secondary | ICD-10-CM | POA: Insufficient documentation

## 2020-02-26 NOTE — Patient Instructions (Signed)
Sacroiliac Joint Dysfunction  Sacroiliac joint dysfunction is a condition that causes inflammation on one or both sides of the sacroiliac (SI) joint. The SI joint connects the lower part of the spine (sacrum) with the two upper portions of the pelvis (ilium). This condition causes deep aching or burning pain in the low back. In some cases, the pain may also spread into one or both buttocks, hips, or thighs. What are the causes? This condition may be caused by:  Pregnancy. During pregnancy, extra stress is put on the SI joints because the pelvis widens.  Injury, such as: ? Injuries from car accidents. ? Sports-related injuries. ? Work-related injuries.  Having one leg that is shorter than the other.  Conditions that affect the joints, such as: ? Rheumatoid arthritis. ? Gout. ? Psoriatic arthritis. ? Joint infection (septic arthritis). Sometimes, the cause of SI joint dysfunction is not known. What are the signs or symptoms? Symptoms of this condition include:  Aching or burning pain in the lower back. The pain may also spread to other areas, such as: ? Buttocks. ? Groin. ? Thighs.  Muscle spasms in or around the painful areas.  Increased pain when standing, walking, running, stair climbing, bending, or lifting. How is this diagnosed? This condition is diagnosed with a physical exam and medical history. During the exam, the health care provider may move one or both of your legs to different positions to check for pain. Various tests may be done to confirm the diagnosis, including:  Imaging tests to look for other causes of pain. These may include: ? MRI. ? CT scan. ? Bone scan.  Diagnostic injection. A numbing medicine is injected into the SI joint using a needle. If your pain is temporarily improved or stopped after the injection, this can indicate that SI joint dysfunction is the problem. How is this treated? Treatment depends on the cause and severity of your condition.  Treatment options may include:  Ice or heat applied to the lower back area after an injury. This may help reduce pain and muscle spasms.  Medicines to relieve pain or inflammation or to relax the muscles.  Wearing a back brace (sacroiliac brace) to help support the joint while your back is healing.  Physical therapy to increase muscle strength around the joint and flexibility at the joint. This may also involve learning proper body positions and ways of moving to relieve stress on the joint.  Direct manipulation of the SI joint.  Injections of steroid medicine into the joint to reduce pain and swelling.  Radiofrequency ablation to burn away nerves that are carrying pain messages from the joint.  Use of a device that provides electrical stimulation to help reduce pain at the joint.  Surgery to put in screws and plates that limit or prevent joint motion. This is rare. Follow these instructions at home: Medicines  Take over-the-counter and prescription medicines only as told by your health care provider.  Do not drive or use heavy machinery while taking prescription pain medicine.  If you are taking prescription pain medicine, take actions to prevent or treat constipation. Your health care provider may recommend that you: ? Drink enough fluid to keep your urine pale yellow. ? Eat foods that are high in fiber, such as fresh fruits and vegetables, whole grains, and beans. ? Limit foods that are high in fat and processed sugars, such as fried or sweet foods. ? Take an over-the-counter or prescription medicine for constipation. If you have a brace:    Wear the brace as told by your health care provider. Remove it only as told by your health care provider.  Keep the brace clean.  If the brace is not waterproof: ? Do not let it get wet. ? Cover it with a watertight covering when you take a bath or a shower. Managing pain, stiffness, and swelling      Icing can help with pain and  swelling. Heat may help with muscle tension or spasms. Ask your health care provider if you should use ice or heat.  If directed, put ice on the affected area: ? If you have a removable brace, remove it as told by your health care provider. ? Put ice in a plastic bag. ? Place a towel between your skin and the bag. ? Leave the ice on for 20 minutes, 2-3 times a day.  If directed, apply heat to the affected area. Use the heat source that your health care provider recommends, such as a moist heat pack or a heating pad. ? Place a towel between your skin and the heat source. ? Leave the heat on for 20-30 minutes. ? Remove the heat if your skin turns bright red. This is especially important if you are unable to feel pain, heat, or cold. You may have a greater risk of getting burned. General instructions  Rest as needed. Ask your health care provider what activities are safe for you.  Return to your normal activities as told by your health care provider.  Exercise as directed by your health care provider or physical therapist.  Do not use any products that contain nicotine or tobacco, such as cigarettes and e-cigarettes. These can delay bone healing. If you need help quitting, ask your health care provider.  Keep all follow-up visits as told by your health care provider. This is important. Contact a health care provider if:  Your pain is not controlled with medicine.  You have a fever.  Your pain is getting worse. Get help right away if:  You have weakness, numbness, or tingling in your legs or feet.  You lose control of your bladder or bowel. Summary  Sacroiliac joint dysfunction is a condition that causes inflammation on one or both sides of the sacroiliac (SI) joint.  This condition causes deep aching or burning pain in the low back. In some cases, the pain may also spread into one or both buttocks, hips, or thighs.  Treatment depends on the cause and severity of your condition.  It may include medicines to reduce pain and swelling or to relax muscles. This information is not intended to replace advice given to you by your health care provider. Make sure you discuss any questions you have with your health care provider. Document Revised: 05/31/2018 Document Reviewed: 11/14/2017 Elsevier Patient Education  2020 Elsevier Inc.  
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
## 2020-03-20 ENCOUNTER — Encounter
Payer: No Typology Code available for payment source | Attending: Physical Medicine & Rehabilitation | Admitting: Physical Medicine & Rehabilitation

## 2020-03-20 ENCOUNTER — Other Ambulatory Visit: Payer: Self-pay

## 2020-03-20 ENCOUNTER — Encounter: Payer: Self-pay | Admitting: Physical Medicine & Rehabilitation

## 2020-03-20 VITALS — BP 127/79 | HR 71 | Temp 97.5°F | Ht 64.0 in | Wt 163.0 lb

## 2020-03-20 DIAGNOSIS — M961 Postlaminectomy syndrome, not elsewhere classified: Secondary | ICD-10-CM | POA: Diagnosis not present

## 2020-03-20 DIAGNOSIS — M533 Sacrococcygeal disorders, not elsewhere classified: Secondary | ICD-10-CM

## 2020-03-20 NOTE — Patient Instructions (Signed)
Sacroiliac injection performed right side ultrasound guidance.  Injected lidocaine plus corticosteroid.  Maximum effect should be in 2 to 3 days.  Duration of effect difficult to predict weeks to months. If the right side is feeling better in the left side pain is still severe, would recommend scheduling a left sacroiliac injection under ultrasound guidance.

## 2020-03-20 NOTE — Progress Notes (Signed)
Ultrasound-guided right sacroiliac injection   Indication right buttock and low back pain status post extensive lumbar fusions.  Pain is only partially responsive to medication management and other conservative care 84 Hz curvilinear near probe was placed over the posterior superior iliac crest on the right side.  No probe was slid distally until the sacroiliac joint could be visualized. The area was marked and prepped with Betadine it was draped with sterile drape.  Then a 25-gauge 1.5 inch needle was used under ultrasound guidance to anesthetize the skin and subcutaneous tissue.  Because the 1.5 inch needle reached that sacroiliac joint this same needle was utilized for the corticosteroid injection.  Out of plane approach was utilized.  Once joint was entered a solution containing 1/2 cc of 6 mg/cc Celestone +1.5 cc of 2% lidocaine was injected.  Patient tolerated procedure well.  Post procedure instructions were given

## 2020-03-20 NOTE — Progress Notes (Signed)
  [facility]   Name: Kendra [name] DOB:[birth year] MRN: [id number]  Date:03/20/2020  Physician: Alysia Penna, MD    Nurse/CMA: LEE CMA  Allergies:  Allergies  Allergen Reactions  . Gabapentin Other (See Comments)    Causes a lot of falls  . Bactrim Other (See Comments)    "out of it"  . Darvocet [Propoxyphene N-Acetaminophen] Nausea And Vomiting  . Latex Rash  . Penicillins Nausea And Vomiting    Has patient had a PCN reaction causing immediate rash, facial/tongue/throat swelling, SOB or lightheadedness with hypotension: No Has patient had a PCN reaction causing severe rash involving mucus membranes or skin necrosis: No  Has patient had a PCN reaction that required hospitalization: No Has patient had a PCN reaction occurring within the last 10 years: No If all of the above answers are "NO", then may proceed with Cephalosporin use. Tolerates amoxicillin   . Percocet [Oxycodone-Acetaminophen] Rash  . Synvisc [Hylan G-F 20] Anxiety and Other (See Comments)    "JITTERY"  . Ultracet [Tramadol-Acetaminophen] Rash    Consent Signed: Yes.    Is patient diabetic? Yes.    CBG today?does not check  Pregnant: No. LMP: Patient's last menstrual period was 01/30/2015. (age 64-55)  Anticoagulants: no Anti-inflammatory: no Antibiotics: no  Procedure:Rightl sacroiliac steroid injection Position: Prone Start Time:3:30pm End Time: 3:45om  Fluoro Time: U/S  RN/CMA Avanni Turnbaugh RN Lee CMA    Time 2:06 3:50pm    BP 127/79 156/95    Pulse 71 75    Respirations 14 14    O2 Sat 95 95    S/S 6 6    Pain Level 8/10 6/10     D/C home with Tabitha, patient A & O X 3, D/C instructions reviewed, and sits independently.

## 2020-04-18 ENCOUNTER — Telehealth: Payer: Self-pay

## 2020-04-18 DIAGNOSIS — M533 Sacrococcygeal disorders, not elsewhere classified: Secondary | ICD-10-CM

## 2020-04-18 NOTE — Telephone Encounter (Signed)
Called ptn sister [name] [phone] to advise Dr. Dossie Arbour is going to do procedure at [facility] - Vickie and Dr. Dossie Arbour will assist with PACE tx and dr. Raliegh Ip will sent referral for SI.

## 2020-04-24 ENCOUNTER — Encounter: Payer: No Typology Code available for payment source | Admitting: Physical Medicine & Rehabilitation

## 2020-04-28 DIAGNOSIS — G894 Chronic pain syndrome: Secondary | ICD-10-CM | POA: Insufficient documentation

## 2020-04-28 DIAGNOSIS — Z79899 Other long term (current) drug therapy: Secondary | ICD-10-CM | POA: Insufficient documentation

## 2020-04-28 DIAGNOSIS — Z789 Other specified health status: Secondary | ICD-10-CM | POA: Insufficient documentation

## 2020-04-28 DIAGNOSIS — M899 Disorder of bone, unspecified: Secondary | ICD-10-CM | POA: Insufficient documentation

## 2020-04-28 NOTE — Progress Notes (Signed)
PROVIDER NOTE: Information contained herein reflects review and annotations entered in association with encounter. Interpretation of such information and data should be left to medically-trained personnel. Information provided to patient can be located elsewhere in the medical record under "Patient Instructions". Document created using STT-dictation technology, any transcriptional errors that may result from process are unintentional.    Patient: Kendra [name]  Service: Procedure (FAST-TRACK REFERRAL)  Provider: Gaspar Cola, MD  DOB: 1960-11-30  DOS: 04/29/2020  Specialty: Interventional Pain Management  MRN: [id number]  Setting: Ambulatory outpatient  Referring Provider: Greig Right, MD  Type: New Patient  Location: Pain clinic facility   office  PCP: Kendra Right, MD  NOTE: FAST-TRACK referrals are offered to accelerate the care of patients in the need of specific interventional pain management therapies. This type of referral does not include non-interventional pain management options.   Procedure:          Anesthesia, Analgesia, Anxiolysis:  Type: Diagnostic Sacroiliac Joint Steroid Injection #1  Region: Superior Lumbosacral Region Level: PSIS (Posterior Superior Iliac Spine) Laterality: Bilateral  Type: Moderate (Conscious) Sedation combined with Local Anesthesia Indication(s): Analgesia and Anxiety Route: Intravenous (IV) IV Access: Secured Sedation: Meaningful verbal contact was maintained at all times during the procedure  Local Anesthetic: Lidocaine 1-2%  Position: Prone           Indications: 1. Chronic sacroiliac joint pain (Bilateral) (L>R)   2. Somatic dysfunction of sacroiliac joints (Bilateral)   3. Chronic low back pain (Bilateral) w/o sciatica   4. History of psoas muscle abscess (HCC) (Left)   5. Chronic pain syndrome   6. Abnormal MRI, lumbar spine (01/15/2020)   7. MRSA (methicillin resistant staph aureus) culture positive    Pain Score: Pre-procedure:  10-Worst pain ever/10 Post-procedure: 0-No pain/10   Primary Reason for Visit: Interventional Pain Management Treatment. CC: Back Pain (lower)  HPI  Kendra [name] is a 59 y.o. year old, female patient, who comes today for a  "Fast-Track" new patient evaluation, as requested by Kendra Right, MD. The patient has been made aware that this type of referral option is reserved for the Interventional Pain Management portion of our practice and completely excludes the option of medication management. Her primarily concern today is the Back Pain (lower)  Pain Assessment: Location: Lower Back Radiating: both legs to the feet Onset: More than a month ago Duration: Chronic pain Quality: Constant Severity: 10-Worst pain ever/10 (subjective, self-reported pain score)  Note: Reported level is compatible with observation.                         When using our objective Pain Scale, levels between 6 and 10/10 are said to belong in an emergency room, as it progressively worsens from a 6/10, described as severely limiting, requiring emergency care not usually available at an outpatient pain management facility. At a 6/10 level, communication becomes difficult and requires great effort. Assistance to reach the emergency department may be required. Facial flushing and profuse sweating along with potentially dangerous increases in heart rate and blood pressure will be evident. Timing: Constant Modifying factors: nothing BP: (!) 143/97   HR: 78  Onset and Duration: Date of onset: 12-03-2019 Cause of pain: Unknown Severity: Getting worse, NAS-11 at its worse: 10/10, NAS-11 at its best: 8/10, NAS-11 now: 10/10 and NAS-11 on the average: 10/10 Timing: Not influenced by the time of the day and After a period of immobility Aggravating Factors: Motion, Prolonged standing and  Twisting Alleviating Factors: nothing listed Associated Problems: Constipation, Night-time cramps, Depression, Nausea, Numbness, Sadness,  Sweating, Weakness and Pain that does not allow patient to sleep Quality of Pain: Aching, Sharp, Shooting and Stabbing Previous Examinations or Tests: CT scan, MRI scan and X-rays Previous Treatments: The patient denies none listed  The patient comes into the clinics today, referred to [place] for a sacroiliac joint injection.  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  Because of her hemiparesis, she has a lot of difficulty moving from one place to another and apparently there was some issue where they did not have the appropriate facilities and/or equipment to move the patient into the procedure table and they require the help of some of the family members.  One of them was present today and she was not shy in let us know with her discontent towards Kendra [name] and his practice.  The patient indicated that currently her low back pain was bilateral and that she wanted for [place] to do the injection on both sides and not just on the [name] side as Kendra [name] had done.  We try to accommodate as much as we could and we hoisted the patient into and out of the procedure bed.  The procedure itself was not complicated, but the transfer is what required some assistance from the patient's Kendra [name].  Today it took a while to prepare for this procedure since I had to first get the results of the patient's last CBC and differential before I could feel comfortable in proceeding with the injection.  In addition, we had to get the results of the cultures from the L1 aspiration to see what kind of organism we could be dealing with.  The patient describes the pain in the lower back groin area and having difficulty flexing her hip.  We have agreed to complete the bilateral sacroiliac joint injections as requested, but I have a very high level of suspicion that some of  this pain that she is experiencing on the left side has to do with the psoas muscle abscess and the possible scar tissue that I might up left behind after it was treated with a antibiotics.  They describe that the patient was not antibiotics for several weeks, but I could not find any documentation of the abscess being aspirated.  Meds   Current Outpatient Medications:    acetaminophen (TYLENOL) 500 MG tablet, Take 1,000 mg by mouth in the morning, at noon, in the evening, and at bedtime. , Disp: , Rfl:    Alogliptin Benzoate 6.25 MG TABS, Take 6.25 mg by mouth daily., Disp: , Rfl:    baclofen (LIORESAL) 10 MG tablet, Take 10 mg by mouth See admin instructions. One tab in a.m., one in evening, one at night, 1/2 tab between these doses, Disp: , Rfl:    buPROPion (WELLBUTRIN XL) 150 MG 24 hr tablet, Take 150 mg by mouth daily., Disp: , Rfl:    celecoxib (CELEBREX) 200 MG capsule, Take 400 mg by mouth in the morning and at bedtime. , Disp: , Rfl:    desmopressin (DDAVP) 0.1 MG tablet, Take 0.1 mg by mouth daily. 1/2 tab daily, Disp: , Rfl:    docusate sodium (COLACE) 100 MG capsule, Take 100 mg by mouth daily., Disp: , Rfl:    ferrous sulfate 325 (65 FE) MG EC  tablet, Take 325 mg by mouth daily with breakfast., Disp: , Rfl:    fexofenadine (ALLEGRA) 180 MG tablet, Take 180 mg by mouth daily., Disp: , Rfl:    HYDROcodone-acetaminophen (NORCO/VICODIN) 5-325 MG tablet, Take 1 tablet by mouth every 4 (four) hours as needed for moderate pain ((score 4 to 6)). (Patient taking differently: Take 1 tablet by mouth in the morning and at bedtime. ), Disp: 30 tablet, Rfl: 0   lamoTRIgine (LAMICTAL) 100 MG tablet, TAKE 1/2 TABLET EVERY MORNING  AND TAKE 1 TABLET EVERY EVENING (Patient taking differently: Take 50-100 mg by mouth See admin instructions. TAKE 50 MG BY MOUTH  EVERY MORNING  AND 100 MG EVERY EVENING), Disp: 45 tablet, Rfl: 0   Melatonin 10 MG TABS, Take 10 mg by mouth at bedtime. , Disp: ,  Rfl:    ondansetron (ZOFRAN) 8 MG tablet, Take 8 mg by mouth every 8 (eight) hours as needed for nausea or vomiting., Disp: , Rfl:    pantoprazole (PROTONIX) 20 MG tablet, Take 20 mg by mouth daily., Disp: , Rfl:    propranolol (INDERAL) 20 MG tablet, Take 2 tablets (40 mg total) by mouth 3 (three) times daily. (Patient taking differently: Take 60 mg by mouth daily. ), Disp: 90 tablet, Rfl: 4   Saccharomyces boulardii (PROBIOTIC) 250 MG CAPS, Take by mouth in the morning and at bedtime., Disp: , Rfl:    acidophilus (RISAQUAD) CAPS capsule, Take 1 capsule by mouth in the morning and at bedtime. (Patient not taking: Reported on 04/29/2020), Disp: , Rfl:   Imaging Review  Cervical Imaging: Cervical MR wo contrast: Results for orders placed during the [name] encounter of 05/02/18 MR CERVICAL SPINE WO CONTRAST  Narrative CLINICAL DATA:  Cervical radiculopathy. Neck pain radiating down the [name] arm with numbness, tingling, and weakness. Left hemiparesis due to a remote injury.  EXAM: MRI CERVICAL SPINE WITHOUT CONTRAST  TECHNIQUE: Multiplanar, multisequence MR imaging of the cervical spine was performed. No intravenous contrast was administered.  COMPARISON:  Cervical spine CT 10/11/2016  FINDINGS: The study was performed under general anesthesia.  Alignment: Chronic mild reversal of the normal cervical lordosis. Trace retrolisthesis of C3 on C4 and C4 on C5. Trace anterolisthesis of C7 on T1.  Vertebrae: No acute fracture or suspicious osseous lesion. T3 vertebral body hemangioma. Degenerative endplate changes throughout the cervical spine, greatest at C3-4, C4-5, and C7-T1.  Cord: T2 hyperintensity bilaterally in the cord at C3-4.  Posterior Fossa, vertebral arteries, paraspinal tissues: Endotracheal tube in place with retained secretions in the pharynx. Partially visualized dependent atelectasis in the lung apices. Partially visualized bilateral palatine tonsillar  prominence. Preserved vertebral artery flow voids.  Disc levels:  Severe disc space narrowing at C3-4, C4-5, C6-7 and C7-T1. Moderate narrowing at C5-6.  C2-3: Mild [name] and moderate to severe left facet arthrosis, small central disc protrusion, and infolding of the ligamentum flavum result in mild spinal stenosis without significant neural foraminal stenosis.  C3-4: Broad-based posterior disc osteophyte complex, central disc extrusion, infolding of the ligamentum flavum, and mild [name] and moderate to severe left facet arthrosis result in severe spinal stenosis with moderate cord flattening and severe bilateral neural foraminal stenosis.  C4-5: Broad-based posterior disc osteophyte complex and infolding of the ligamentum flavum result in moderate spinal stenosis and mild [name] and severe left neural foraminal stenosis.  C5-6: Broad-based posterior disc osteophyte complex with more focal central component results in mild spinal stenosis and borderline [name] and severe left neural foraminal stenosis.  C6-7: Disc bulging and left greater than [name] uncovertebral spurring result in severe left neural foraminal stenosis without spinal stenosis.  C7-T1: Disc bulging, left greater than [name] uncovertebral spurring, and mild facet arthrosis result in moderate left neural foraminal stenosis without spinal stenosis.  T1-2: Disc bulging and superimposed central disc protrusion without stenosis.  T2-3: Only imaged sagittally. Moderate [name] and mild left facet arthrosis without stenosis.  IMPRESSION: 1. Diffuse lumbar disc degeneration, greatest at C3-4 where there is severe spinal stenosis, severe bilateral neural foraminal stenosis, and spinal cord signal abnormality compatible with myelomalacia. 2. Moderate spinal stenosis at C4-5. 3. Mild spinal stenosis at C2-3 and C5-6. 4. Severe left neural foraminal stenosis at C4-5, C5-6, and C6-7.   Electronically Signed By: Logan Bores M.D. On: 05/02/2018 13:33  Cervical DG 1 view: Results for orders placed during the [name] encounter of 06/15/18 DG Cervical Spine 1 View  Narrative CLINICAL DATA:  ACDF  EXAM: DG CERVICAL SPINE - 1 VIEW; DG C-ARM 61-120 MIN  COMPARISON:  MRI 05/02/2018  FINDINGS: Changes of ACDF from C3-C5. Normal alignment. No visible complicating feature.  IMPRESSION: ACDF C3-C5.  No visible complicating feature.   Electronically Signed By: Rolm Baptise M.D. On: 06/15/2018 22:16  Shoulder Imaging: Shoulder-L DG: Results for orders placed during the [name] encounter of 09/03/05 DG Shoulder Left  Narrative Clinical Data:    Multiple intra-articular loose bodies on a recent MRI with question of osteochondromatosis. LEFT SHOULDER: Comparison: 09/05/02 and MRI from 08/10/05 has been reviewed. Findings: There is no evidence for an acute fracture, separation or dislocation.  Several mineralized loose bodies project over the inferior joint consistent with calcified intra-articular loose bodies as seen at MR.  On the previous study, the patient had a Grashey projection through the glenohumeral joint which demonstrates substantial loss of joint space with subchondral sclerosis and marginal spurring consistent with degenerative change. IMPRESSION: Degenerative changes in the glenohumeral joint more apparent by plain film on the previous comparison study.  Ossified loose bodies noted.  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  Provider: Pearla Dubonnet  Lumbosacral Imaging: Lumbar MR wo contrast: Results for orders placed during the [name] encounter of 01/15/20 MR LUMBAR SPINE WO CONTRAST  Narrative CLINICAL DATA:  Back pain.  EXAM: MRI LUMBAR SPINE WITHOUT CONTRAST  TECHNIQUE: Multiplanar, multisequence MR imaging of the lumbar spine  was performed. No intravenous contrast was administered.  COMPARISON:  CT of the abdomen January 09, 2020.  FINDINGS: Segmentation:  Standard.  Alignment: Focal kyphosis at L1-2. Anterolisthesis of L4 over L5 and retrolisthesis of L1 over L2.  Vertebrae: Interbody fusion and posterior fixation at L1-2 and L5-S1 with posterior rods from L1 through S1. Susceptibility artifact from the hardware degraded images at these levels.  Superior end plate fracture of L2 with corresponding hardware near/at the L1-2 disc level.  L1 vertebral body collapse with anterior wedging and apparent fracture line extending to the posterior aspect of the superior endplate (series 4, image 9). The superior fragment appear edematous while the posterior/inferior vertebral body appear sclerotic. There is retropulsion of the inferior aspect of the posterior wall into the spinal canal, resulting in moderate spinal canal stenosis. Diffuse marrow edema is seen of the T12 vertebral body with erosion of the posterior aspect of the inferior endplate. Prominent edema is seen in the corresponding T12-L1 disc space. Loculated fluid collection is  noted in the left psoas muscle measuring approximately 3.2 x 1.6 x 0.8 cm with surrounding edema. This fluid collection appear to be contiguous with the T12-L1 disc space edema. Findings are concerning for discitis/osteomyelitis and associated psoas abscess.  Conus medullaris and cauda equina: Conus extends to the T12-L1 level. There appears to be mild impingement on the conus medullaris/roots of the cauda equina at T12-L1, although evaluation is limited by susceptibility artifact from hardware.  Paraspinal and other soft tissues: Fluid collection in the left psoas, as described above. Fatty atrophy of the lower lumbar/sacral paraspinal musculature.  Disc levels:  T11-T12: No spinal canal or neural foraminal stenosis.  T12-L1: Left paracentral posterior disc protrusion  causing indentation on the left anterior surface of the conus medullaris and resulting in mild spinal canal stenosis. There appears to be severe bilateral neural foraminal narrowing.  L1-2: Retropulsion of the inferior L1 posterior wall resulting in moderate spinal canal stenosis. Evaluation of the neural foramen is limited by susceptibility artifact.  L2-3: No spinal canal stenosis. Evaluation of the neural foramen is limited by susceptibility artifact.  L3-4: No spinal canal or neural foraminal stenosis.  L4-5: No spinal canal stenosis. Evaluation of the neural foramen is limited by susceptibility artifact.  L5-S1: No spinal canal or neural foraminal stenosis.  IMPRESSION: 1. Findings concerning for discitis/osteomyelitis at T12-L1 with associated left psoas abscess. 2. Retropulsion of the inferior L1 posterior wall into the spinal canal resulting moderate spinal canal stenosis and severe bilateral neural foraminal narrowing.  These results will be called to the ordering clinician or representative by the Radiologist Assistant, and communication documented in the PACS or [employer].   Electronically Signed By: Pedro Earls M.D. On: 01/15/2020 14:33  Lumbar MR w/wo contrast: Results for orders placed during the [name] encounter of 06/23/05 MR Lumbar Spine W Wo Contrast  Narrative Clinical data:  Pain running down legs. MRI LUMBAR SPINE WITHOUT AND WITH CONTRAST: Technique:  Multiplanar and multiecho pulse sequences of the lumbar spine, to include the lower thoracic region and upper sacral regions, were obtained according to standard protocol before and after administration of intravenous contrast. Contrast:  20 cc Omniscan. Findings:  Patient was sedated per radiology protocol under guidance of radiology nurse supervision without difficulty. Comparison:  Plain film examination performed 11/26/03 reveals five nonrib bearing lumbar type vertebrae.   Prior MR scan 06/27/03.  These films are not available for direct review although report from such is available. Patient was moving throughout the exam.  Several sequences were repeated.  The patient has undergone fusion from the L3 to the upper sacrum.  There is minimal grade I anterior spondylolisthesis of L3 upon L4 and L4 upon L5.  Conus L1 level. T11-12, T12-L1, and L1-2 unremarkable. L2-3:  Moderate bulge.  Facet joint bony overgrowth.  Moderate spinal stenosis with a trefoil appearance.  Mild bilateral foraminal narrowing. L3-4:  Status post fusion.  Minimal anterior spondylolisthesis.  No significant spinal stenosis.  Evaluation of neural foramen limited by metallic artifact. L4-5:  Status post fusion.  Minimal anterior spondylolisthesis.  No significant spinal stenosis.  Evaluation of neural foramen limited by metallic artifact. L5-S1:  Status post fusion.  Evaluation of neural foramen slightly limited by metallic artifact.  There may be mild left sided neural foraminal narrowing. IMPRESSION: Motion degraded exam combined with metallic artifact caused by hardware utilized for fusion from L3 to the upper sacrum limits present examination.  At the fused sites, there does not appear to be  significant spinal stenosis.  Just above the fused sites, there is multifactorial moderate L2-3 spinal stenosis as described above.  Provider: Vangie Bicker  Lumbar CT w contrast: Results for orders placed during the [name] encounter of 03/22/15 CT Lumbar Spine W Contrast  Narrative CLINICAL DATA:  Severe back pain with spasms. Previous lumbar fusion from L2-S1.  EXAM: CT LUMBAR SPINE WITH CONTRAST  TECHNIQUE: Multidetector CT imaging of the lumbar spine was performed with intravenous contrast administration. Multiplanar CT image reconstructions were also generated.  CONTRAST:  188m OMNIPAQUE IOHEXOL 300 MG/ML  SOLN  COMPARISON:  Radiographs dated 03/22/2015 and 02/15/2015 and CT scan dated  01/10/2014  FINDINGS: The patient has developed interval fracture through the superior a aspect of L5 just under the interbody fusion device with 1.3-2.4 mm of new anterolisthesis, best seen on images 31 and 40 of series 6. The fracture extends to the anterior superior aspect of the L5 vertebral body.  There is excellent posterior decompression of the spinal canal from a mid L2 through L5-S1. Pedicle screws and hardware are intact. Small amount of fluid in the posterior soft tissues extending from L2-3 and L4-5. This is consistent with seroma. The fluid collection extends to the posterior aspect of the thecal sac.  The fusions at L3-4 and L5-S1 are solid. There is integration of the fusion device at L2-3 without discrete solid bony bridging at this time.  IMPRESSION: New fracture through the superior endplate of L5 with new slight anterolisthesis of L4 on L5 as described above.   Electronically Signed By: JLorriane ShireM.D. On: 03/22/2015 15:50  Lumbar DG 1V: Results for orders placed during the [name] encounter of 03/22/15 DG Lumbar Spine 1 View  Narrative CLINICAL DATA:  Subsequent encounter for revision of lumbar fusion.  EXAM: LUMBAR SPINE - 1 VIEW  COMPARISON:  CT scan from 03/22/2015.  FINDINGS: Same numbering scheme used today as was used on the recent CT scan. Patient is status post extensive lumbar fusion. Single lateral film shows pedicle screws at L2, L3, L5, and S1, as before. Anterolisthesis of L4 on 5 appears stable. Interbody graft markers at L2-3 appear stable.  IMPRESSION: L5 superior endplate fracture seen on the recent CT scan is not evident on today's plain film. The degree of anterolisthesis of L4 on 5 appears stable on this cross modality comparison.   Electronically Signed By: EMisty StanleyM.D. On: 03/28/2015 16:14  Lumbar DG 2-3 views: Results for orders placed during the [name] encounter of 02/11/15 DG Lumbar Spine 2-3  Views  Narrative CLINICAL DATA:  L2-L3 PLIF for stenosis  EXAM: DG C-ARM 61-120 MIN; LUMBAR SPINE - 2-3 VIEW  COMPARISON:  Lumbar myelogram 01/10/2014  FINDINGS: AP and cross-table lateral spot fluoroscopic views of a portion of the lumbar spine are submitted. These demonstrate postsurgical changes of posterior laminectomy and interbody fusion at L2-L3. Pre-existing screws are seen posteriorly at L5.  IMPRESSION: Spot fluoroscopic views demonstrate postsurgical changes of PLIF at L2-L3.   Electronically Signed By: SCurlene DolphinM.D. On: 02/11/2015 10:44  Lumbar DG (Complete) 4+V: Results for orders placed during the [name] encounter of 03/22/15 DG Lumbar Spine Complete  Narrative CLINICAL DATA:  Low back pain.  Surgery April 2016.  EXAM: LUMBAR SPINE - COMPLETE 4+ VIEW  COMPARISON:  Lumbar spine radiographs 02/15/2015.  FINDINGS: Lumbar fusion at L3-4 and L5-S1 is stable. More recent fusion at L2-3 is unchanged. There appears to be a lucency at L4-5. Anterolisthesis is slightly greater than on prior  studies. The hardware is intact. Soft tissues are unremarkable.  IMPRESSION: 1. Question lucency along the previously fused segment at L4-5. CT of the lumbar spine is recommended for further evaluation. 2. Otherwise stable fusion at L2-3, L3-4, and L5-S1.   Electronically Signed By: San Morelle M.D. On: 03/22/2015 12:00        Lumbar DG Myelogram Lumbosacral: Results for orders placed during the [name] encounter of 01/10/14 DG MYELOGRAPHY LUMBAR INJ LUMBOSACRAL  Narrative CLINICAL DATA:  Back pain.  Spinal fusion with recurrent back pain.  EXAM: LUMBAR MYELOGRAM  FLUOROSCOPY TIME:  1 min 35 seconds  PROCEDURE: After thorough discussion of risks and benefits of the procedure including bleeding, infection, injury to nerves, blood vessels, adjacent structures as well as headache and CSF leak, written and oral informed consent was obtained.  Consent was obtained by Dr. Dereck Ligas. Time out form was completed.  Patient was positioned prone on the fluoroscopy table. Local anesthesia was provided with 1% lidocaine without epinephrine after prepped and draped in the usual sterile fashion. Puncture was performed at L4 using a 3 1/2 inch 22-gauge spinal needle via midline approach. Using a single pass through the dura, the needle was placed within the thecal sac, with return of clear CSF. 15 mL of Omnipaque-180 was injected into the thecal sac, with normal opacification of the nerve roots and cauda equina consistent with free flow within the subarachnoid space.  I personally performed the lumbar puncture and administered the intrathecal contrast. I also personally supervised acquisition of the myelogram images.  TECHNIQUE: Contiguous axial images were obtained through the Lumbar spine after the intrathecal infusion of infusion. Coronal and sagittal reconstructions were obtained of the axial image sets.  COMPARISON:  10/23/2013.  03/25/2007.  FINDINGS: LUMBAR MYELOGRAM FINDINGS:  Thecal sac appears widely decompressed from L3 inferiorly. Rod and screw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 with post-procedure discharge instructions, including a section on how to identify potential problems. Should any problems arise concerning this procedure, the patient was given  instructions to immediately contact us, at any time, without hesitation. In any case, we plan to contact the patient by telephone for a follow-up status report regarding this interventional procedure.  Comments:  No additional relevant information.  Plan of Care  Orders:  Orders Placed This Encounter  Procedures   SACROILIAC JOINT INJECTION    Scheduling Instructions:     Side: Bilateral     Sedation: Patient's choice.     Timeframe: Today    Order Specific Question:   Where will this procedure be performed?    Answer:   [facility]   DG PAIN [facility] C-ARM 1-60 MIN NO REPORT    Intraoperative interpretation by procedural physician at [facility].    Standing Status:   Standing    Number of Occurrences:   1    Order Specific Question:   Reason for exam:    Answer:   Assistance in needle guidance and placement for procedures requiring needle placement in or near specific anatomical locations not easily accessible without such assistance.   Informed Consent Details: Physician/Practitioner Attestation; Transcribe to consent form and obtain patient signature    Provider Attestation: I, Crestline Dossie Arbour, MD, (Pain Management Specialist), the physician/practitioner, attest that I have discussed with the patient the benefits, risks, side effects, alternatives, likelihood of achieving goals and potential problems during recovery for the procedure that I have provided informed  consent.    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     Note: Always confirm laterality of pain with Ms. Scroggin, before procedure.   Provide equipment / supplies at bedside    Equipment required: Single use, disposable, "Block Tray"    Standing Status:   Standing    Number of Occurrences:   1    Order Specific Question:   Specify    Answer:   Block Tray   Chronic Opioid Analgesic:  No opioid analgesics prescribed by our practice.    Medications ordered for procedure: Meds ordered this encounter  Medications   lidocaine (XYLOCAINE) 2 % (with pres) injection 400 mg   methylPREDNISolone acetate (DEPO-MEDROL) injection 80 mg   ropivacaine (PF) 2 mg/mL (0.2%) (NAROPIN) injection 9 mL   Medications administered: We administered lidocaine, methylPREDNISolone acetate, and ropivacaine (PF) 2 mg/mL (0.2%).  See the medical record for exact dosing, route, and time of administration.  Follow-up plan:   Return in about 2 weeks (around 05/13/2020) for VV(15-min), (PP).        Interventional treatment options: Planned, scheduled, and/or pending:   Diagnostic/therapeutic bilateral intra-articular sacroiliac joint block #1 (today)   Under consideration:   No further interventional therapies at this time.  This was a "fast-track".   Therapeutic/palliative (PRN):   None at this time    Recent Visits No visits were found meeting these conditions. Showing recent visits within past 90 days and meeting all other requirements Today's Visits Date Type Provider Dept  04/29/20 Procedure visit Milinda Pointer, MD [facility]  Showing today's visits and meeting all other requirements Future Appointments Date Type Provider Dept  05/12/20 Appointment Milinda Pointer, MD  [facility]  Showing future appointments within next 90 days and meeting all other requirements  Disposition: Discharge home  Discharge (Date   Time): 04/29/2020; 1050 hrs.   Primary  Care Physician: Kendra Right, MD Location: [facility] Note by: Gaspar Cola, MD Date: 04/29/2020; Time: 4:12 PM  Disclaimer:  Medicine is not an Chief Strategy Officer. The only guarantee in medicine is that nothing is guaranteed. It is important to note that the decision to proceed with this intervention was based on the information collected from the patient. The Data and conclusions were drawn from the patient's questionnaire, the interview, and the physical examination. Because the information was provided in large part by the patient, it cannot be guaranteed that it has not been purposely or unconsciously manipulated. Every effort has been made to obtain as much relevant data as possible for this evaluation. It is important to note that the conclusions that lead to this procedure are derived in large part from the available data. Always take into account that the treatment will also be dependent on availability of resources and existing treatment guidelines, considered by other Pain Management Practitioners as being common knowledge and practice, at the time of the intervention. For Medico-Legal purposes, it is also important to point out that variation in procedural techniques and pharmacological choices are the acceptable norm. The indications, contraindications, technique, and results of the above procedure should only be interpreted and judged by a Board-Certified Interventional Pain Specialist with extensive familiarity and expertise in the same exact procedure and technique.

## 2020-04-29 ENCOUNTER — Other Ambulatory Visit: Payer: Self-pay

## 2020-04-29 ENCOUNTER — Encounter: Payer: Self-pay | Admitting: Pain Medicine

## 2020-04-29 ENCOUNTER — Ambulatory Visit (HOSPITAL_BASED_OUTPATIENT_CLINIC_OR_DEPARTMENT_OTHER): Payer: No Typology Code available for payment source | Admitting: Pain Medicine

## 2020-04-29 ENCOUNTER — Ambulatory Visit
Admission: RE | Admit: 2020-04-29 | Discharge: 2020-04-29 | Disposition: A | Discharge status: Home / Self Care | Payer: No Typology Code available for payment source | Source: Ambulatory Visit | Attending: Pain Medicine | Admitting: Pain Medicine

## 2020-04-29 VITALS — BP 143/97 | HR 78 | Temp 97.7°F | Resp 23 | Ht 61.0 in | Wt 163.0 lb

## 2020-04-29 DIAGNOSIS — G8929 Other chronic pain: Secondary | ICD-10-CM | POA: Diagnosis not present

## 2020-04-29 DIAGNOSIS — K6812 Psoas muscle abscess: Secondary | ICD-10-CM

## 2020-04-29 DIAGNOSIS — M545 Low back pain, unspecified: Secondary | ICD-10-CM

## 2020-04-29 DIAGNOSIS — M533 Sacrococcygeal disorders, not elsewhere classified: Secondary | ICD-10-CM | POA: Insufficient documentation

## 2020-04-29 DIAGNOSIS — M9904 Segmental and somatic dysfunction of sacral region: Secondary | ICD-10-CM

## 2020-04-29 DIAGNOSIS — R937 Abnormal findings on diagnostic imaging of other parts of musculoskeletal system: Secondary | ICD-10-CM

## 2020-04-29 DIAGNOSIS — Z22322 Carrier or suspected carrier of Methicillin resistant Staphylococcus aureus: Secondary | ICD-10-CM | POA: Insufficient documentation

## 2020-04-29 DIAGNOSIS — M47898 Other spondylosis, sacral and sacrococcygeal region: Secondary | ICD-10-CM | POA: Insufficient documentation

## 2020-04-29 DIAGNOSIS — Z79899 Other long term (current) drug therapy: Secondary | ICD-10-CM | POA: Diagnosis not present

## 2020-04-29 DIAGNOSIS — G894 Chronic pain syndrome: Secondary | ICD-10-CM

## 2020-04-29 MED ORDER — ROPIVACAINE HCL 2 MG/ML IJ SOLN
9.0000 mL | Freq: Once | INTRAMUSCULAR | Status: AC
  Administered 2020-04-29: 9 mL via INTRA_ARTICULAR

## 2020-04-29 MED ORDER — LIDOCAINE HCL 2 % IJ SOLN
20.0000 mL | Freq: Once | INTRAMUSCULAR | Status: AC
  Administered 2020-04-29: 400 mg

## 2020-04-29 MED ORDER — METHYLPREDNISOLONE ACETATE 80 MG/ML IJ SUSP
INTRAMUSCULAR | Status: AC
  Filled 2020-04-29: qty 1

## 2020-04-29 MED ORDER — ROPIVACAINE HCL 2 MG/ML IJ SOLN
INTRAMUSCULAR | Status: AC
  Filled 2020-04-29: qty 10

## 2020-04-29 MED ORDER — METHYLPREDNISOLONE ACETATE 80 MG/ML IJ SUSP
80.0000 mg | Freq: Once | INTRAMUSCULAR | Status: AC
  Administered 2020-04-29: 80 mg via INTRA_ARTICULAR

## 2020-04-29 MED ORDER — TRIAMCINOLONE ACETONIDE 40 MG/ML IJ SUSP
INTRAMUSCULAR | Status: AC
  Filled 2020-04-29: qty 1

## 2020-04-29 MED ORDER — LIDOCAINE HCL 2 % IJ SOLN
INTRAMUSCULAR | Status: AC
  Filled 2020-04-29: qty 10

## 2020-04-29 NOTE — Patient Instructions (Addendum)
____________________________________________________________________________________________  Post-Procedure Discharge Instructions  Instructions:  Apply ice:   Purpose: This will minimize any swelling and discomfort after procedure.   When: Day of procedure, as soon as you get home.  How: Fill a plastic sandwich bag with crushed ice. Cover it with a small towel and apply to injection site.  How long: (15 min on, 15 min off) Apply for 15 minutes then remove x 15 minutes.  Repeat sequence on day of procedure, until you go to bed.  Apply heat:   Purpose: To treat any soreness and discomfort from the procedure.  When: Starting the next day after the procedure.  How: Apply heat to procedure site starting the day following the procedure.  How long: May continue to repeat daily, until discomfort goes away.  Food intake: Start with clear liquids (like water) and advance to regular food, as tolerated.   Physical activities: Keep activities to a minimum for the first 8 hours after the procedure. After that, then as tolerated.  Driving: If you have received any sedation, be responsible and do not drive. You are not allowed to drive for 24 hours after having sedation.  Blood thinner: (Applies only to those taking blood thinners) You may restart your blood thinner 6 hours after your procedure.  Insulin: (Applies only to Diabetic patients taking insulin) As soon as you can eat, you may resume your normal dosing schedule.  Infection prevention: Keep procedure site clean and dry. Shower daily and clean area with soap and water.  Post-procedure Pain Diary: Extremely important that this be done correctly and accurately. Recorded information will be used to determine the next step in treatment. For the purpose of accuracy, follow these rules:  Evaluate only the area treated. Do not report or include pain from an untreated area. For the purpose of this evaluation, ignore all other areas of pain,  except for the treated area.  After your procedure, avoid taking a long nap and attempting to complete the pain diary after you wake up. Instead, set your alarm clock to go off every hour, on the hour, for the initial 8 hours after the procedure. Document the duration of the numbing medicine, and the relief you are getting from it.  Do not go to sleep and attempt to complete it later. It will not be accurate. If you received sedation, it is likely that you were given a medication that may cause amnesia. Because of this, completing the diary at a later time may cause the information to be inaccurate. This information is needed to plan your care.  Follow-up appointment: Keep your post-procedure follow-up evaluation appointment after the procedure (usually 2 weeks for most procedures, 6 weeks for radiofrequencies). DO NOT FORGET to bring you pain diary with you.   Expect: (What should I expect to see with my procedure?)  From numbing medicine (AKA: Local Anesthetics): Numbness or decrease in pain. You may also experience some weakness, which if present, could last for the duration of the local anesthetic.  Onset: Full effect within 15 minutes of injected.  Duration: It will depend on the type of local anesthetic used. On the average, 1 to 8 hours.   From steroids (Applies only if steroids were used): Decrease in swelling or inflammation. Once inflammation is improved, relief of the pain will follow.  Onset of benefits: Depends on the amount of swelling present. The more swelling, the longer it will take for the benefits to be seen. In some cases, up to 10 days.    Duration: Steroids will stay in the system x 2 weeks. Duration of benefits will depend on multiple posibilities including persistent irritating factors.  Side-effects: If present, they may typically last 2 weeks (the duration of the steroids).  Frequent: Cramps (if they occur, drink Gatorade and take over-the-counter Magnesium 450-500 mg  once to twice a day); water retention with temporary weight gain; increases in blood sugar; decreased immune system response; increased appetite.  Occasional: Facial flushing (red, warm cheeks); mood swings; menstrual changes.  Uncommon: Long-term decrease or suppression of natural hormones; bone thinning. (These are more common with higher doses or more frequent use. This is why we prefer that our patients avoid having any injection therapies in other practices.)   Very Rare: Severe mood changes; psychosis; aseptic necrosis.  From procedure: Some discomfort is to be expected once the numbing medicine wears off. This should be minimal if ice and heat are applied as instructed.  Call if: (When should I call?)  You experience numbness and weakness that gets worse with time, as opposed to wearing off.  New onset bowel or bladder incontinence. (Applies only to procedures done in the spine)  Emergency Numbers:  Durning business hours (Monday - Thursday, 8:00 AM - 4:00 PM) (Friday, 9:00 AM - 12:00 Noon): [phone]  After hours: [phone]  NOTE: If you are having a problem and are unable connect with, or to talk to a provider, then go to your nearest urgent care or emergency department. If the problem is serious and urgent, please call 911. ____________________________________________________________________________________________   ____________________________________________________________________________________________  General Risks and Possible Complications  Patient Responsibilities: It is important that you read this as it is part of your informed consent. It is our duty to inform you of the risks and possible complications associated with treatments offered to you. It is your responsibility as a patient to read this and to ask questions about anything that is not clear or that you believe was not covered in this document.  Patient's Rights: You have the right to refuse  treatment. You also have the right to change your mind, even after initially having agreed to have the treatment done. However, under this last option, if you wait until the last second to change your mind, you may be charged for the materials used up to that point.  Introduction: Medicine is not an Chief Strategy Officer. Everything in Medicine, including the lack of treatment(s), carries the potential for danger, harm, or loss (which is by definition: Risk). In Medicine, a complication is a secondary problem, condition, or disease that can aggravate an already existing one. All treatments carry the risk of possible complications. The fact that a side effects or complications occurs, does not imply that the treatment was conducted incorrectly. It must be clearly understood that these can happen even when everything is done following the highest safety standards.  No treatment: You can choose not to proceed with the proposed treatment alternative. The "PRO(s)" would include: avoiding the risk of complications associated with the therapy. The "CON(s)" would include: not getting any of the treatment benefits. These benefits fall under one of three categories: diagnostic; therapeutic; and/or palliative. Diagnostic benefits include: getting information which can ultimately lead to improvement of the disease or symptom(s). Therapeutic benefits are those associated with the successful treatment of the disease. Finally, palliative benefits are those related to the decrease of the primary symptoms, without necessarily curing the condition (example: decreasing the pain from a flare-up of a chronic condition, such as  incurable terminal cancer).  General Risks and Complications: These are associated to most interventional treatments. They can occur alone, or in combination. They fall under one of the following six (6) categories: no benefit or worsening of symptoms; bleeding; infection; nerve damage; allergic reactions; and/or  death. 1. No benefits or worsening of symptoms: In Medicine there are no guarantees, only probabilities. No healthcare provider can ever guarantee that a medical treatment will work, they can only state the probability that it may. Furthermore, there is always the possibility that the condition may worsen, either directly, or indirectly, as a consequence of the treatment. 2. Bleeding: This is more common if the patient is taking a blood thinner, either prescription or over the counter (example: Goody Powders, Fish oil, Aspirin, Garlic, etc.), or if suffering a condition associated with impaired coagulation (example: Hemophilia, cirrhosis of the liver, low platelet counts, etc.). However, even if you do not have one on these, it can still happen. If you have any of these conditions, or take one of these drugs, make sure to notify your treating physician. 3. Infection: This is more common in patients with a compromised immune system, either due to disease (example: diabetes, cancer, human immunodeficiency virus [HIV], etc.), or due to medications or treatments (example: therapies used to treat cancer and rheumatological diseases). However, even if you do not have one on these, it can still happen. If you have any of these conditions, or take one of these drugs, make sure to notify your treating physician. 4. Nerve Damage: This is more common when the treatment is an invasive one, but it can also happen with the use of medications, such as those used in the treatment of cancer. The damage can occur to small secondary nerves, or to large primary ones, such as those in the spinal cord and brain. This damage may be temporary or permanent and it may lead to impairments that can range from temporary numbness to permanent paralysis and/or brain death. 5. Allergic Reactions: Any time a substance or material comes in contact with our body, there is the possibility of an allergic reaction. These can range from a mild skin  rash (contact dermatitis) to a severe systemic reaction (anaphylactic reaction), which can result in death. 6. Death: In general, any medical intervention can result in death, most of the time due to an unforeseen complication. ____________________________________________________________________________________________  ____________________________________________________________________________________________  Post-Procedure Discharge Instructions  Instructions:  Apply ice:   Purpose: This will minimize any swelling and discomfort after procedure.   When: Day of procedure, as soon as you get home.  How: Fill a plastic sandwich bag with crushed ice. Cover it with a small towel and apply to injection site.  How long: (15 min on, 15 min off) Apply for 15 minutes then remove x 15 minutes.  Repeat sequence on day of procedure, until you go to bed.  Apply heat:   Purpose: To treat any soreness and discomfort from the procedure.  When: Starting the next day after the procedure.  How: Apply heat to procedure site starting the day following the procedure.  How long: May continue to repeat daily, until discomfort goes away.  Food intake: Start with clear liquids (like water) and advance to regular food, as tolerated.   Physical activities: Keep activities to a minimum for the first 8 hours after the procedure. After that, then as tolerated.  Driving: If you have received any sedation, be responsible and do not drive. You are not allowed to drive for  24 hours after having sedation.  Blood thinner: (Applies only to those taking blood thinners) You may restart your blood thinner 6 hours after your procedure.  Insulin: (Applies only to Diabetic patients taking insulin) As soon as you can eat, you may resume your normal dosing schedule.  Infection prevention: Keep procedure site clean and dry. Shower daily and clean area with soap and water.  Post-procedure Pain Diary: Extremely  important that this be done correctly and accurately. Recorded information will be used to determine the next step in treatment. For the purpose of accuracy, follow these rules:  Evaluate only the area treated. Do not report or include pain from an untreated area. For the purpose of this evaluation, ignore all other areas of pain, except for the treated area.  After your procedure, avoid taking a long nap and attempting to complete the pain diary after you wake up. Instead, set your alarm clock to go off every hour, on the hour, for the initial 8 hours after the procedure. Document the duration of the numbing medicine, and the relief you are getting from it.  Do not go to sleep and attempt to complete it later. It will not be accurate. If you received sedation, it is likely that you were given a medication that may cause amnesia. Because of this, completing the diary at a later time may cause the information to be inaccurate. This information is needed to plan your care.  Follow-up appointment: Keep your post-procedure follow-up evaluation appointment after the procedure (usually 2 weeks for most procedures, 6 weeks for radiofrequencies). DO NOT FORGET to bring you pain diary with you.   Expect: (What should I expect to see with my procedure?)  From numbing medicine (AKA: Local Anesthetics): Numbness or decrease in pain. You may also experience some weakness, which if present, could last for the duration of the local anesthetic.  Onset: Full effect within 15 minutes of injected.  Duration: It will depend on the type of local anesthetic used. On the average, 1 to 8 hours.   From steroids (Applies only if steroids were used): Decrease in swelling or inflammation. Once inflammation is improved, relief of the pain will follow.  Onset of benefits: Depends on the amount of swelling present. The more swelling, the longer it will take for the benefits to be seen. In some cases, up to 10  days.  Duration: Steroids will stay in the system x 2 weeks. Duration of benefits will depend on multiple posibilities including persistent irritating factors.  Side-effects: If present, they may typically last 2 weeks (the duration of the steroids).  Frequent: Cramps (if they occur, drink Gatorade and take over-the-counter Magnesium 450-500 mg once to twice a day); water retention with temporary weight gain; increases in blood sugar; decreased immune system response; increased appetite.  Occasional: Facial flushing (red, warm cheeks); mood swings; menstrual changes.  Uncommon: Long-term decrease or suppression of natural hormones; bone thinning. (These are more common with higher doses or more frequent use. This is why we prefer that our patients avoid having any injection therapies in other practices.)   Very Rare: Severe mood changes; psychosis; aseptic necrosis.  From procedure: Some discomfort is to be expected once the numbing medicine wears off. This should be minimal if ice and heat are applied as instructed.  Call if: (When should I call?)  You experience numbness and weakness that gets worse with time, as opposed to wearing off.  New onset bowel or bladder incontinence. (Applies only to  procedures done in the spine)  Emergency Numbers:  Durning business hours (Monday - Thursday, 8:00 AM - 4:00 PM) (Friday, 9:00 AM - 12:00 Noon): [phone]  After hours: [phone]  NOTE: If you are having a problem and are unable connect with, or to talk to a provider, then go to your nearest urgent care or emergency department. If the problem is serious and urgent, please call 911. ____________________________________________________________________________________________

## 2020-04-29 NOTE — Progress Notes (Signed)
Safety precautions to be maintained throughout the outpatient stay will include: orient to surroundings, keep bed in low position, maintain call bell within reach at all times, provide assistance with transfer out of bed and ambulation.  

## 2020-04-30 ENCOUNTER — Telehealth: Payer: Self-pay | Admitting: *Deleted

## 2020-04-30 NOTE — Telephone Encounter (Signed)
Spoke with patient's niece and she reports that the patient is still having pain. She did use her ice some yesterday.  She is currently at [facility].  No issues other than pain.  They will continue to monitor.

## 2020-04-30 NOTE — Telephone Encounter (Signed)
Attempted to call for post procedure follow-up. Message left. 

## 2020-05-01 ENCOUNTER — Other Ambulatory Visit: Payer: Self-pay | Admitting: Pain Medicine

## 2020-05-01 DIAGNOSIS — G894 Chronic pain syndrome: Secondary | ICD-10-CM

## 2020-05-01 DIAGNOSIS — Z79899 Other long term (current) drug therapy: Secondary | ICD-10-CM

## 2020-05-05 ENCOUNTER — Other Ambulatory Visit: Payer: Self-pay | Admitting: Pain Medicine

## 2020-05-08 LAB — COMPLIANCE DRUG ANALYSIS, UR

## 2020-05-11 NOTE — Progress Notes (Signed)
NO SHOW

## 2020-05-12 ENCOUNTER — Encounter: Payer: No Typology Code available for payment source | Admitting: Pain Medicine

## 2020-05-12 ENCOUNTER — Encounter: Payer: Self-pay | Admitting: Pain Medicine

## 2020-05-28 ENCOUNTER — Ambulatory Visit: Payer: No Typology Code available for payment source | Admitting: Pain Medicine

## 2020-06-01 NOTE — Progress Notes (Signed)
This patient's chart is under "My Open Charts". These are cancelled appointments that keep popping into my "In Basket" as a deficiency. See what you can do to remove them.   Thank you. 

## 2020-06-11 ENCOUNTER — Other Ambulatory Visit: Payer: Self-pay

## 2020-06-12 ENCOUNTER — Other Ambulatory Visit: Payer: Self-pay | Admitting: Physician Assistant

## 2020-06-12 DIAGNOSIS — M48061 Spinal stenosis, lumbar region without neurogenic claudication: Secondary | ICD-10-CM

## 2020-06-18 ENCOUNTER — Other Ambulatory Visit: Payer: Self-pay

## 2020-06-18 ENCOUNTER — Ambulatory Visit
Admission: RE | Admit: 2020-06-18 | Discharge: 2020-06-18 | Disposition: A | Discharge status: Home / Self Care | Payer: No Typology Code available for payment source | Source: Ambulatory Visit | Attending: Physician Assistant | Admitting: Physician Assistant

## 2020-06-18 DIAGNOSIS — M48061 Spinal stenosis, lumbar region without neurogenic claudication: Secondary | ICD-10-CM

## 2020-06-18 MED ORDER — IOPAMIDOL (ISOVUE-M 300) INJECTION 61%
1.0000 mL | Freq: Once | INTRAMUSCULAR | Status: AC
  Administered 2020-06-18: 1 mL via EPIDURAL

## 2020-06-18 MED ORDER — TRIAMCINOLONE ACETONIDE 40 MG/ML IJ SUSP (RADIOLOGY)
60.0000 mg | Freq: Once | INTRAMUSCULAR | Status: AC
  Administered 2020-06-18: 60 mg via EPIDURAL

## 2020-06-18 NOTE — Discharge Instructions (Signed)

## 2020-07-21 ENCOUNTER — Telehealth: Payer: Self-pay

## 2020-07-21 NOTE — Telephone Encounter (Signed)
PTN FAMILY CALLED AND WANTS TO CONFIRM PTN COULD BE SEEN HERE FOR UPPER EXTREMITY BOTOXO - I ADVISED AS LONG AS NO TRANSFER IS REQ WE CAN DO BOTOX INJ- WAITING ON REFERRAL

## 2020-07-21 NOTE — Telephone Encounter (Signed)
Spoke to [place]- patients's sister. She is waiting on patient surgeon to decide his next plan and then she will call back to schedule Botox for upper extremity only.

## 2020-07-23 NOTE — Telephone Encounter (Signed)
Looking backat chart Kendra [name] saw pt and did botox in 2018.  He has appt with her inNov, please address questions to Dr Kendra [name]

## 2020-09-10 ENCOUNTER — Ambulatory Visit: Payer: No Typology Code available for payment source | Admitting: Physical Medicine & Rehabilitation

## 2020-09-17 ENCOUNTER — Other Ambulatory Visit: Payer: Self-pay

## 2020-09-17 ENCOUNTER — Encounter: Payer: Self-pay | Admitting: Physical Medicine & Rehabilitation

## 2020-09-17 ENCOUNTER — Encounter
Payer: No Typology Code available for payment source | Attending: Physical Medicine & Rehabilitation | Admitting: Physical Medicine & Rehabilitation

## 2020-09-17 VITALS — BP 126/76 | HR 88 | Temp 98.2°F

## 2020-09-17 DIAGNOSIS — G8114 Spastic hemiplegia affecting left nondominant side: Secondary | ICD-10-CM | POA: Insufficient documentation

## 2020-09-17 DIAGNOSIS — G243 Spasmodic torticollis: Secondary | ICD-10-CM | POA: Diagnosis not present

## 2020-09-17 NOTE — Progress Notes (Signed)
Botox Injection for spasticity of upper extremity using needle EMG guidance Indication: Left spastic hemiparesis (HCC)  Cervical dystonia   Dilution: 100 Units/ml        Total Units Injected: 300 Indication: Severe spasticity which interferes with ADL,mobility and/or  hygiene and is unresponsive to medication management and other conservative care Informed consent was obtained after describing risks and benefits of the procedure with the patient. This includes bleeding, bruising, infection, excessive weakness, or medication side effects. A REMS form is on file and signed.  Needle: 23mm injectable monopolar needle electrode    Number of units per muscle LEFT SCM 50U, 2 ACCESS PTS LEFT TRICEPS 3 ACCESS PTS, 200U LEFT TERES MAJOR 50U 2 ACCESS PTS   All injections were done after obtaining appropriate EMG activity and after negative drawback for blood. The patient tolerated the procedure well. Post procedure instructions were given. Return in about 3 months (around 12/16/2020) for 300-400U BOTOX LUE AND TRUNK.

## 2020-09-17 NOTE — Patient Instructions (Signed)
PLEASE FEEL FREE TO CALL OUR OFFICE WITH ANY PROBLEMS OR QUESTIONS ([phone])      

## 2020-12-17 ENCOUNTER — Encounter: Payer: Self-pay | Admitting: Physical Medicine & Rehabilitation

## 2020-12-17 ENCOUNTER — Other Ambulatory Visit: Payer: Self-pay

## 2020-12-17 ENCOUNTER — Encounter
Payer: No Typology Code available for payment source | Attending: Physical Medicine & Rehabilitation | Admitting: Physical Medicine & Rehabilitation

## 2020-12-17 VITALS — BP 104/68 | HR 77 | Temp 98.2°F | Ht 61.0 in

## 2020-12-17 DIAGNOSIS — G243 Spasmodic torticollis: Secondary | ICD-10-CM | POA: Insufficient documentation

## 2020-12-17 DIAGNOSIS — G8114 Spastic hemiplegia affecting left nondominant side: Secondary | ICD-10-CM | POA: Insufficient documentation

## 2020-12-17 NOTE — Progress Notes (Signed)
Botox Injection for spasticity of upper extremity using needle EMG guidance Indication: Left spastic hemiparesis (HCC)  Cervical dystonia   Dilution: 100 Units/ml        Total Units Injected: 300 Indication: Severe spasticity which interferes with ADL,mobility and/or  hygiene and is unresponsive to medication management and other conservative care Informed consent was obtained after describing risks and benefits of the procedure with the patient. This includes bleeding, bruising, infection, excessive weakness, or medication side effects. A REMS form is on file and signed.  Needle: 52mm injectable monopolar needle electrode    Number of units per muscle Left SCM 100u Left trap 75 u Left levator scapulae 50 units Biceps 25 units Triceps 50 units    All injections were done after obtaining appropriate EMG activity and after negative drawback for blood. The patient tolerated the procedure well. Post procedure instructions were given. Return in about 3 months (around 03/19/2021) for 300 UNITS LEFT SCM, TRICEPS, LATS, LEVATOR SCAPULAE.

## 2020-12-17 NOTE — Patient Instructions (Signed)
DAILY STRETCHING OF YOUR NECK, HEAD, AND SHOULDER IS IMPORTANT.    NECK SHOULD BE STRETCHED FORWARD, ROTATIONALLY TO THE RIGHT, LATERALLY TO THE RIGHT, AND IN EXTENSION FOR 30-60 MINUTES PER DAY.   WORK ON EXTENDING, FLEXING AND ABDUCTING LEFT SHOULDER AS WELL.

## 2021-03-18 ENCOUNTER — Encounter: Payer: Self-pay | Admitting: Physical Medicine & Rehabilitation

## 2021-03-18 ENCOUNTER — Encounter
Payer: No Typology Code available for payment source | Attending: Physical Medicine & Rehabilitation | Admitting: Physical Medicine & Rehabilitation

## 2021-03-18 ENCOUNTER — Other Ambulatory Visit: Payer: Self-pay

## 2021-03-18 DIAGNOSIS — G243 Spasmodic torticollis: Secondary | ICD-10-CM

## 2021-03-18 DIAGNOSIS — G8114 Spastic hemiplegia affecting left nondominant side: Secondary | ICD-10-CM

## 2021-03-18 NOTE — Patient Instructions (Signed)
PLEASE FEEL FREE TO CALL OUR OFFICE WITH ANY PROBLEMS OR QUESTIONS ([phone])      

## 2021-03-18 NOTE — Progress Notes (Signed)
Botox Injection for spasticity of upper extremity using needle EMG guidance Indication: Cervical dystonia  Left spastic hemiparesis (HCC)   Dilution: 100 Units/ml        Total Units Injected: 300 Indication: Severe spasticity which interferes with ADL,mobility and/or  hygiene and is unresponsive to medication management and other conservative care Informed consent was obtained after describing risks and benefits of the procedure with the patient. This includes bleeding, bruising, infection, excessive weakness, or medication side effects. A REMS form is on file and signed.  Needle: 79mm injectable monopolar needle electrode    Number of units per muscle Left side Trapezius 75 units 3 access points SCM 50 units 2 access points Levator Scapulae 50units 2 access points Triceps 125 units 5 access points    All injections were done after obtaining appropriate EMG activity and after negative drawback for blood. The patient tolerated the procedure well. Post procedure instructions were given. Return in about 3 months (around 06/18/2021) for dysport LUE.

## 2021-06-24 ENCOUNTER — Encounter: Payer: Self-pay | Admitting: Physical Medicine & Rehabilitation

## 2021-06-24 ENCOUNTER — Encounter
Payer: No Typology Code available for payment source | Attending: Physical Medicine & Rehabilitation | Admitting: Physical Medicine & Rehabilitation

## 2021-06-24 ENCOUNTER — Other Ambulatory Visit: Payer: Self-pay

## 2021-06-24 VITALS — BP 131/69 | HR 84 | Ht 67.0 in | Wt 129.0 lb

## 2021-06-24 DIAGNOSIS — G8114 Spastic hemiplegia affecting left nondominant side: Secondary | ICD-10-CM | POA: Diagnosis not present

## 2021-06-24 NOTE — Patient Instructions (Signed)
PLEASE FEEL FREE TO CALL OUR OFFICE WITH ANY PROBLEMS OR QUESTIONS ([phone])      

## 2021-06-24 NOTE — Progress Notes (Signed)
Dysport Injection for spasticity of upper extremity using needle EMG guidance Indication: Left spastic hemiparesis (HCC) G81.14  Dilution: 500 Units/2m        Total Units Injected: 800 Indication: Severe spasticity which interferes with ADL,mobility and/or  hygiene and is unresponsive to medication management and other conservative care Informed consent was obtained after describing risks and benefits of the procedure with the patient. This includes bleeding, bruising, infection, excessive weakness, or medication side effects. A REMS form is on file and signed.  Needle: 532minjectable monopolar needle electrode   Left  Trapezius 150 units 3 access points SCM 125 units 2 access points Scalenes 100 units 2 access pts Levator scapulae 125 units 2 access poins Triceps 150 units 3 access points Biceps 150 units 3 acces points   All injections were done after obtaining appropriate EMG activity and after negative drawback for blood. The patient tolerated the procedure well. Post procedure instructions were given. Return in about 3 months (around 09/23/2021) for dysport 800u left shoulder girdle and triceps.

## 2021-09-23 ENCOUNTER — Other Ambulatory Visit: Payer: Self-pay

## 2021-09-23 ENCOUNTER — Encounter: Payer: Self-pay | Admitting: Physical Medicine & Rehabilitation

## 2021-09-23 ENCOUNTER — Encounter
Payer: No Typology Code available for payment source | Attending: Physical Medicine & Rehabilitation | Admitting: Physical Medicine & Rehabilitation

## 2021-09-23 VITALS — BP 123/74 | HR 88 | Temp 98.3°F | Ht 67.0 in | Wt 129.0 lb

## 2021-09-23 DIAGNOSIS — G8114 Spastic hemiplegia affecting left nondominant side: Secondary | ICD-10-CM | POA: Diagnosis not present

## 2021-09-23 NOTE — Patient Instructions (Signed)
PLEASE FEEL FREE TO CALL OUR OFFICE WITH ANY PROBLEMS OR QUESTIONS ([phone])      

## 2021-09-23 NOTE — Progress Notes (Signed)
Dysport Injection for spasticity using needle EMG guidance Indication:  No diagnosis found.   Dilution: 500 Units/64ml        Total Units Injected:  800 Indication: Severe spasticity which interferes with ADL,mobility and/or  hygiene and is unresponsive to medication management and other conservative care Informed consent was obtained after describing risks and benefits of the procedure with the patient. This includes bleeding, bruising, infection, excessive weakness, or medication side effects. A REMS form is on file and signed.  left Needle: 61mm injectable monopolar needle electrode  Number of units per muscle Trap 125 3 access pts SCM 150 2 access pts Scalenes 100 2 access pts Levator Scap 125 2 access pts Pectoralis Major 0 units Pectoralis Minor 0 units Biceps 100 units 2 access pts Triceps 200 units 2 access pts Brachioradialis 0 units FCR 0 units FCU 0 units FDS 0 units FDP 0 units FPL 0 units Pronator Teres 0 units Pronator Quadratus 0 units Lumbricals 0 units  All injections were done after obtaining appropriate EMG activity and after negative drawback for blood. The patient tolerated the procedure well. Post procedure instructions were given.

## 2021-12-30 ENCOUNTER — Encounter
Payer: No Typology Code available for payment source | Attending: Physical Medicine & Rehabilitation | Admitting: Physical Medicine & Rehabilitation

## 2021-12-30 ENCOUNTER — Other Ambulatory Visit: Payer: Self-pay

## 2021-12-30 ENCOUNTER — Encounter: Payer: Self-pay | Admitting: Physical Medicine & Rehabilitation

## 2021-12-30 VITALS — BP 125/82 | HR 80 | Temp 98.3°F | Ht 67.0 in

## 2021-12-30 DIAGNOSIS — G8114 Spastic hemiplegia affecting left nondominant side: Secondary | ICD-10-CM | POA: Diagnosis present

## 2021-12-30 DIAGNOSIS — G243 Spasmodic torticollis: Secondary | ICD-10-CM | POA: Insufficient documentation

## 2021-12-30 NOTE — Progress Notes (Signed)
Dysport Injection for spasticity using needle EMG guidance ?Indication:  No diagnosis found. ?G24.3   ?  ?Dilution: 500  Units/33m        Total Units Injected:  800 u ?Indication: Severe spasticity which interferes with ADL,mobility and/or  hygiene and is unresponsive to medication management and other conservative care ?Informed consent was obtained after describing risks and benefits of the procedure with the patient. This includes bleeding, bruising, infection, excessive weakness, or medication side effects. A REMS form is on file and signed. ?  ?left ?Needle: 553minjectable monopolar needle electrode ?  ?Number of units per muscle ?Trap 250 3 access pts ?SCM 400 4 access pts ?Scalenes 0 units ?Levator Scap 50 units access pts ?Pectoralis Major 0 units ?Pectoralis Minor 0 units ?Biceps 0 units ?Triceps 0 units ?Brachioradialis 0 units ?FCR 0 units ?FCU 0 units ?FDS 0 units ?FDP 0 units ?FPL 0 units ?Pronator Teres 0 units ?Pronator Quadratus 0 units ?Lumbricals 0 units ?  ?All injections were done after obtaining appropriate EMG activity and after negative drawback for blood. The patient tolerated the procedure well. Post procedure instructions were given.  ? ?We focused primarily on the neck and shoulder girdle today as her left arm has been fairly loose and the neck is a major impediment to posture, hygiene, etc.  ?

## 2021-12-30 NOTE — Patient Instructions (Signed)
PLEASE FEEL FREE TO CALL OUR OFFICE WITH ANY PROBLEMS OR QUESTIONS ([phone])      

## 2022-04-14 ENCOUNTER — Encounter
Payer: No Typology Code available for payment source | Attending: Physical Medicine & Rehabilitation | Admitting: Physical Medicine & Rehabilitation

## 2022-04-14 ENCOUNTER — Encounter: Payer: Self-pay | Admitting: Physical Medicine & Rehabilitation

## 2022-04-14 VITALS — BP 146/83 | HR 76

## 2022-04-14 DIAGNOSIS — G8114 Spastic hemiplegia affecting left nondominant side: Secondary | ICD-10-CM | POA: Insufficient documentation

## 2022-04-14 DIAGNOSIS — G243 Spasmodic torticollis: Secondary | ICD-10-CM | POA: Insufficient documentation

## 2022-04-14 NOTE — Progress Notes (Signed)
Dysport Injection for spasticity using needle EMG guidance Indication:  Cervical dystonia  Left spastic hemiparesis (HCC)   Dilution: 500 Units/3m        Total Units Injected:  800 Indication: Severe spasticity which interferes with ADL,mobility and/or  hygiene and is unresponsive to medication management and other conservative care Informed consent was obtained after describing risks and benefits of the procedure with the patient. This includes bleeding, bruising, infection, excessive weakness, or medication side effects. A REMS form is on file and signed.  left Needle: 54minjectable monopolar needle electrode  Number of units per muscle Trap 250 units 3 access points Sternocleidomastoid 500 units 4 access points Levator Scapulae 50 units, 1 access point Scalenes 0 units Pectoralis Major 0 units Pectoralis Minor 0 units Biceps 0 units Brachioradialis 0 units FCR 0 units FCU 0 units FDS 0 units FDP 0 units FPL 0 units Pronator Teres 0 units Pronator Quadratus 0 units Lumbricals 0 units  All injections were done after obtaining appropriate EMG activity and after negative drawback for blood. The patient tolerated the procedure well. Post procedure instructions were given.

## 2022-04-14 NOTE — Patient Instructions (Signed)
PLEASE FEEL FREE TO CALL OUR OFFICE WITH ANY PROBLEMS OR QUESTIONS ([phone])      

## 2022-07-28 ENCOUNTER — Encounter
Payer: No Typology Code available for payment source | Attending: Physical Medicine & Rehabilitation | Admitting: Physical Medicine & Rehabilitation

## 2022-07-28 ENCOUNTER — Encounter: Payer: Self-pay | Admitting: Physical Medicine & Rehabilitation

## 2022-07-28 VITALS — BP 119/80 | HR 92

## 2022-07-28 DIAGNOSIS — G243 Spasmodic torticollis: Secondary | ICD-10-CM | POA: Diagnosis present

## 2022-07-28 DIAGNOSIS — G8114 Spastic hemiplegia affecting left nondominant side: Secondary | ICD-10-CM | POA: Diagnosis present

## 2022-07-28 MED ORDER — ABOBOTULINUMTOXINA 300 UNITS IM SOLR
300.0000 [IU] | Freq: Once | INTRAMUSCULAR | Status: AC
  Administered 2022-07-28: 300 [IU] via INTRAMUSCULAR

## 2022-07-28 MED ORDER — SODIUM CHLORIDE (PF) 0.9 % IJ SOLN
4.0000 mL | Freq: Once | INTRAMUSCULAR | Status: AC
  Administered 2022-07-28: 4 mL via INTRAVENOUS

## 2022-07-28 MED ORDER — ABOBOTULINUMTOXINA 500 UNITS IM SOLR
500.0000 [IU] | Freq: Once | INTRAMUSCULAR | Status: AC
  Administered 2022-07-28: 500 [IU] via INTRAMUSCULAR

## 2022-07-28 NOTE — Patient Instructions (Signed)
ALWAYS FEEL FREE TO CALL OUR OFFICE WITH ANY PROBLEMS OR QUESTIONS ([phone])  **PLEASE NOTE** ALL MEDICATION REFILL REQUESTS (INCLUDING CONTROLLED SUBSTANCES) NEED TO BE MADE AT LEAST 7 DAYS PRIOR TO REFILL BEING DUE. ANY REFILL REQUESTS INSIDE THAT TIME FRAME MAY RESULT IN DELAYS IN RECEIVING YOUR PRESCRIPTION.                    

## 2022-07-28 NOTE — Progress Notes (Signed)
Dysport Injection for spasticity using needle EMG guidance Indication:  Cervical dystonia (G24.3) and spastic left hemiparesis (G81.14)     Dilution: 500 Units/51m        Total Units Injected: 800 Indication: Severe spasticity which interferes with ADL,mobility and/or  hygiene and is unresponsive to medication management and other conservative care Informed consent was obtained after describing risks and benefits of the procedure with the patient. This includes bleeding, bruising, infection, excessive weakness, or medication side effects. A REMS form is on file and signed.   Side: left   Needle: 558minjectable monopolar needle electrode   Number of units per muscle Trapezius 150 units 4 access points Levator scapulae 250 units  4  access points Sternocleidomastoid 250 units 4 access points Scalenes - units - access points Trapezius 150     All injections were done after obtaining appropriate EMG activity and after negative drawback for blood. The patient tolerated the procedure well. Post procedure instructions were given.

## 2022-12-01 ENCOUNTER — Encounter
Payer: No Typology Code available for payment source | Attending: Physical Medicine & Rehabilitation | Admitting: Physical Medicine & Rehabilitation

## 2022-12-01 ENCOUNTER — Encounter: Payer: Self-pay | Admitting: Physical Medicine & Rehabilitation

## 2022-12-01 VITALS — BP 133/84 | HR 70 | Ht 67.0 in

## 2022-12-01 DIAGNOSIS — G8114 Spastic hemiplegia affecting left nondominant side: Secondary | ICD-10-CM | POA: Diagnosis present

## 2022-12-01 DIAGNOSIS — G243 Spasmodic torticollis: Secondary | ICD-10-CM | POA: Diagnosis present

## 2022-12-01 MED ORDER — ABOBOTULINUMTOXINA 500 UNITS IM SOLR
800.0000 [IU] | Freq: Once | INTRAMUSCULAR | Status: DC
  Administered 2022-12-01: 800 [IU] via INTRAMUSCULAR

## 2022-12-01 MED ORDER — ABOBOTULINUMTOXINA 500 UNITS IM SOLR
500.0000 [IU] | Freq: Once | INTRAMUSCULAR | Status: AC
  Administered 2022-12-01: 500 [IU] via INTRAMUSCULAR

## 2022-12-01 MED ORDER — ABOBOTULINUMTOXINA 500 UNITS IM SOLR
300.0000 [IU] | Freq: Once | INTRAMUSCULAR | Status: AC
  Administered 2022-12-01: 300 [IU] via INTRAMUSCULAR

## 2022-12-01 NOTE — Patient Instructions (Signed)
ALWAYS FEEL FREE TO CALL OUR OFFICE WITH ANY PROBLEMS OR QUESTIONS ([phone])  **PLEASE NOTE** ALL MEDICATION REFILL REQUESTS (INCLUDING CONTROLLED SUBSTANCES) NEED TO BE MADE AT LEAST 7 DAYS PRIOR TO REFILL BEING DUE. ANY REFILL REQUESTS INSIDE THAT TIME FRAME MAY RESULT IN DELAYS IN RECEIVING YOUR PRESCRIPTION.                    

## 2022-12-01 NOTE — Progress Notes (Addendum)
Dysport Injection for spasticity using needle EMG guidance Indication:  Cervical dystonia, spastic left hemiparesis G24.3/G81.14    Dilution: 500 Units/65m        Total Units Injected: 800 Indication: Severe spasticity which interferes with ADL,mobility and/or  hygiene and is unresponsive to medication management and other conservative care Informed consent was obtained after describing risks and benefits of the procedure with the patient. This includes bleeding, bruising, infection, excessive weakness, or medication side effects. A REMS form is on file and signed.   Side: left  Needle: 5106minjectable monopolar needle electrode   Number of units per muscle Trapezius 150 units 4 access points Levator scapulae 250 units  4  access points Sternocleidomastoid 300 units 4 access points Scalenes 0 units 0 access points Triceps 100 units 2 access points    All injections were done after obtaining appropriate EMG activity and after negative drawback for blood. The patient tolerated the procedure well. Post procedure instructions were given.

## 2022-12-01 NOTE — Addendum Note (Signed)
Addended by: Franchot Gallo on: 12/01/2022 02:31 PM   Modules accepted: Orders

## 2023-03-02 ENCOUNTER — Encounter: Payer: Self-pay | Admitting: Physical Medicine & Rehabilitation

## 2023-03-02 ENCOUNTER — Encounter
Payer: No Typology Code available for payment source | Attending: Physical Medicine & Rehabilitation | Admitting: Physical Medicine & Rehabilitation

## 2023-03-02 VITALS — BP 109/68 | HR 84 | Temp 98.5°F | Ht 67.0 in

## 2023-03-02 DIAGNOSIS — G243 Spasmodic torticollis: Secondary | ICD-10-CM | POA: Diagnosis not present

## 2023-03-02 DIAGNOSIS — G8114 Spastic hemiplegia affecting left nondominant side: Secondary | ICD-10-CM | POA: Diagnosis not present

## 2023-03-02 MED ORDER — ABOBOTULINUMTOXINA 500 UNITS IM SOLR
300.0000 [IU] | Freq: Once | INTRAMUSCULAR | Status: AC
  Administered 2023-03-02: 300 [IU] via INTRAMUSCULAR

## 2023-03-02 MED ORDER — SODIUM CHLORIDE (PF) 0.9 % IJ SOLN
3.0000 mL | INTRAMUSCULAR | Status: AC | PRN
  Administered 2024-07-11: 3 mL via INTRAVENOUS

## 2023-03-02 MED ORDER — ABOBOTULINUMTOXINA 300 UNITS IM SOLR
300.0000 [IU] | Freq: Once | INTRAMUSCULAR | Status: AC
  Administered 2023-03-02: 300 [IU] via INTRAMUSCULAR

## 2023-03-02 NOTE — Patient Instructions (Addendum)
ALWAYS FEEL FREE TO CALL OUR OFFICE WITH ANY PROBLEMS OR QUESTIONS [phone])  **PLEASE NOTE** ALL MEDICATION REFILL REQUESTS (INCLUDING CONTROLLED SUBSTANCES) NEED TO BE MADE AT LEAST 7 DAYS PRIOR TO REFILL BEING DUE. ANY REFILL REQUESTS INSIDE THAT TIME FRAME MAY RESULT IN DELAYS IN RECEIVING YOUR PRESCRIPTION.    RESTING WRIST HAND ORTHOSIS TO STRETCH LEFT WRIST AND HAND   RESTING "GUTTER" ARMREST FOR WHEELCHAIR.

## 2023-03-02 NOTE — Progress Notes (Signed)
  Dysport Injection for spasticity using needle EMG guidance Indication:  Cervical dystonia, spastic left hemiparesis G24.3/G81.14    Dilution: 500 Units/6ml        Total Units Injected: 800 Indication: Severe spasticity which interferes with ADL,mobility and/or  hygiene and is unresponsive to medication management and other conservative care Informed consent was obtained after describing risks and benefits of the procedure with the patient. This includes bleeding, bruising, infection, excessive weakness, or medication side effects. A REMS form is on file and signed.   Side: left  Needle: 50mm injectable monopolar needle electrode   Number of units per muscle Trapezius 150 units 3 access points Levator scapulae 200 units  4  access points Sternocleidomastoid 350 units 4 access points Scalenes 0 units 0 access points Triceps 100 units 2 access points    All injections were done after obtaining appropriate EMG activity and after negative drawback for blood. The patient tolerated the procedure well. Post procedure instructions were given.        Discussed resting WHO and gutter for w/c to stretch and support RUE.

## 2023-06-08 ENCOUNTER — Encounter: Payer: Self-pay | Admitting: Physical Medicine & Rehabilitation

## 2023-06-08 ENCOUNTER — Encounter
Payer: PRIVATE HEALTH INSURANCE | Attending: Physical Medicine & Rehabilitation | Admitting: Physical Medicine & Rehabilitation

## 2023-06-08 VITALS — BP 112/59 | HR 68 | Ht 67.0 in

## 2023-06-08 DIAGNOSIS — G243 Spasmodic torticollis: Secondary | ICD-10-CM

## 2023-06-08 DIAGNOSIS — G8114 Spastic hemiplegia affecting left nondominant side: Secondary | ICD-10-CM | POA: Diagnosis present

## 2023-06-08 MED ORDER — ABOBOTULINUMTOXINA 300 UNITS IM SOLR
300.0000 [IU] | Freq: Once | INTRAMUSCULAR | Status: AC
Start: 2023-06-08 — End: 2023-06-08
  Administered 2023-06-08: 300 [IU] via INTRAMUSCULAR

## 2023-06-08 MED ORDER — SODIUM CHLORIDE (PF) 0.9 % IJ SOLN
1.6000 mL | Freq: Once | INTRAMUSCULAR | Status: AC
Start: 2023-06-08 — End: 2023-06-08
  Administered 2023-06-08: 1.6 mL

## 2023-06-08 MED ORDER — ABOBOTULINUMTOXINA 500 UNITS IM SOLR
500.0000 [IU] | Freq: Once | INTRAMUSCULAR | Status: AC
Start: 2023-06-08 — End: 2023-06-08
  Administered 2023-06-08: 500 [IU] via INTRAMUSCULAR

## 2023-06-08 NOTE — Progress Notes (Signed)
Dysport Injection for spasticity using needle EMG guidance Indication:  Cervical dystonia G24.3/G81.14   Dilution: 500 Units/66ml        Total Units Injected: 800 Indication: Severe spasticity which interferes with ADL,mobility and/or  hygiene and is unresponsive to medication management and other conservative care Informed consent was obtained after describing risks and benefits of the procedure with the patient. This includes bleeding, bruising, infection, excessive weakness, or medication side effects. A REMS form is on file and signed.   Side: left  Needle: 50mm injectable monopolar needle electrode   Number of units per muscle Trapezius 150 units 3 access points Levator scapulae 100 units 2  access points Sternocleidomastoid 450 units 4 access points Scalenes 0 units 0 access points Triceps 100 units 2 access pts     All injections were done after obtaining appropriate EMG activity and after negative drawback for blood. The patient tolerated the procedure well. Post procedure instructions were given.

## 2023-06-08 NOTE — Patient Instructions (Signed)
ALWAYS FEEL FREE TO CALL OUR OFFICE WITH ANY PROBLEMS OR QUESTIONS ([phone])  **PLEASE NOTE** ALL MEDICATION REFILL REQUESTS (INCLUDING CONTROLLED SUBSTANCES) NEED TO BE MADE AT LEAST 7 DAYS PRIOR TO REFILL BEING DUE. ANY REFILL REQUESTS INSIDE THAT TIME FRAME MAY RESULT IN DELAYS IN RECEIVING YOUR PRESCRIPTION.                    

## 2023-09-14 ENCOUNTER — Encounter: Payer: Self-pay | Admitting: Physical Medicine & Rehabilitation

## 2023-09-14 ENCOUNTER — Encounter
Payer: PRIVATE HEALTH INSURANCE | Attending: Physical Medicine & Rehabilitation | Admitting: Physical Medicine & Rehabilitation

## 2023-09-14 VITALS — BP 103/64 | HR 81 | Ht 67.0 in

## 2023-09-14 DIAGNOSIS — G8114 Spastic hemiplegia affecting left nondominant side: Secondary | ICD-10-CM | POA: Diagnosis present

## 2023-09-14 DIAGNOSIS — G243 Spasmodic torticollis: Secondary | ICD-10-CM | POA: Diagnosis present

## 2023-09-14 MED ORDER — ABOBOTULINUMTOXINA 300 UNITS IM SOLR
300.0000 [IU] | Freq: Once | INTRAMUSCULAR | Status: AC
Start: 2023-09-14 — End: 2023-09-14
  Administered 2023-09-14: 300 [IU] via INTRAMUSCULAR

## 2023-09-14 MED ORDER — ABOBOTULINUMTOXINA 500 UNITS IM SOLR
500.0000 [IU] | Freq: Once | INTRAMUSCULAR | Status: AC
Start: 2023-09-14 — End: 2023-09-14
  Administered 2023-09-14: 500 [IU] via INTRAMUSCULAR

## 2023-09-14 NOTE — Progress Notes (Signed)
Dysport Injection for spasticity using needle EMG guidance Indication:  Left spastic hemiparesis (HCC)  Cervical dystonia G24.3/G81.14  Dilution: 500 Units/48ml        Total Units Injected:  800 Indication: Severe spasticity which interferes with ADL,mobility and/or  hygiene and is unresponsive to medication management and other conservative care Informed consent was obtained after describing risks and benefits of the procedure with the patient. This includes bleeding, bruising, infection, excessive weakness, or medication side effects. A REMS form is on file and signed.  Left side Needle: 50mm injectable monopolar needle electrode Traps 250 u 3 access pts Levator scapuae 100 u 2 access points Sternocleidomastoid 450 u 4 access pts Scalenes 0 Number of units per muscle Pectoralis Major 0 units Pectoralis Minor 0 units Biceps 0 units Triceps 0 units FCR  units FCU  units FDS  units FDP  units FPL  units Pronator Teres  units Pronator Quadratus  units Lumbricals  units  All injections were done after obtaining appropriate EMG activity and after negative drawback for blood. The patient tolerated the procedure well. Post procedure instructions were given.  Will see her back in 3 mos for dysport 1000 u so that we can capture triceps too.

## 2023-09-14 NOTE — Patient Instructions (Signed)
ALWAYS FEEL FREE TO CALL OUR OFFICE WITH ANY PROBLEMS OR QUESTIONS ([phone])  **PLEASE NOTE** ALL MEDICATION REFILL REQUESTS (INCLUDING CONTROLLED SUBSTANCES) NEED TO BE MADE AT LEAST 7 DAYS PRIOR TO REFILL BEING DUE. ANY REFILL REQUESTS INSIDE THAT TIME FRAME MAY RESULT IN DELAYS IN RECEIVING YOUR PRESCRIPTION.                    

## 2023-12-28 ENCOUNTER — Encounter
Payer: No Typology Code available for payment source | Attending: Physical Medicine & Rehabilitation | Admitting: Physical Medicine & Rehabilitation

## 2023-12-28 ENCOUNTER — Encounter: Payer: Self-pay | Admitting: Physical Medicine & Rehabilitation

## 2023-12-28 VITALS — BP 110/65 | HR 77

## 2023-12-28 DIAGNOSIS — G243 Spasmodic torticollis: Secondary | ICD-10-CM | POA: Diagnosis present

## 2023-12-28 DIAGNOSIS — G8114 Spastic hemiplegia affecting left nondominant side: Secondary | ICD-10-CM | POA: Diagnosis not present

## 2023-12-28 MED ORDER — ABOBOTULINUMTOXINA 500 UNITS IM SOLR
500.0000 [IU] | Freq: Once | INTRAMUSCULAR | Status: AC
Start: 2023-12-28 — End: 2023-12-28
  Administered 2023-12-28: 500 [IU] via INTRAMUSCULAR

## 2023-12-28 NOTE — Progress Notes (Signed)
 Dysport Injection for spasticity using needle EMG guidance Indication:  Cervical dystonia  G24.3/ G81.14   Dilution: 500 Units/38ml        Total Units Injected: 800 Indication: Severe spasticity which interferes with ADL,mobility and/or  hygiene and is unresponsive to medication management and other conservative care Informed consent was obtained after describing risks and benefits of the procedure with the patient. This includes bleeding, bruising, infection, excessive weakness, or medication side effects. A REMS form is on file and signed.   Side: left  Needle: 50mm injectable monopolar needle electrode   Number of units per muscle Trapezius 200 units 3 access points Levator scapulae 50 units  2  access points Sternocleidomastoid 450 units 4 access points Scalenes 0 units 0 access points Triceps 100 units     All injections were done after obtaining appropriate EMG activity and after negative drawback for blood. The patient tolerated the procedure well. Post procedure instructions were given.

## 2023-12-28 NOTE — Patient Instructions (Signed)
 ALWAYS FEEL FREE TO CALL OUR OFFICE WITH ANY PROBLEMS OR QUESTIONS [phone])  **PLEASE NOTE** ALL MEDICATION REFILL REQUESTS (INCLUDING CONTROLLED SUBSTANCES) NEED TO BE MADE AT LEAST 7 DAYS PRIOR TO REFILL BEING DUE. ANY REFILL REQUESTS INSIDE THAT TIME FRAME MAY RESULT IN DELAYS IN RECEIVING YOUR PRESCRIPTION.

## 2024-04-04 ENCOUNTER — Encounter: Payer: Self-pay | Admitting: Physical Medicine & Rehabilitation

## 2024-04-04 ENCOUNTER — Encounter: Attending: Physical Medicine & Rehabilitation | Admitting: Physical Medicine & Rehabilitation

## 2024-04-04 VITALS — BP 107/73 | HR 96 | Ht 67.0 in

## 2024-04-04 DIAGNOSIS — G243 Spasmodic torticollis: Secondary | ICD-10-CM

## 2024-04-04 DIAGNOSIS — G8114 Spastic hemiplegia affecting left nondominant side: Secondary | ICD-10-CM

## 2024-04-04 MED ORDER — ABOBOTULINUMTOXINA 300 UNITS IM SOLR
300.0000 [IU] | Freq: Once | INTRAMUSCULAR | Status: AC
Start: 2024-04-04 — End: 2024-04-04
  Administered 2024-04-04: 300 [IU] via INTRAMUSCULAR

## 2024-04-04 MED ORDER — SODIUM CHLORIDE (PF) 0.9 % IJ SOLN
4.0000 mL | Freq: Once | INTRAMUSCULAR | Status: AC
Start: 2024-04-04 — End: 2024-04-04
  Administered 2024-04-04: 4 mL

## 2024-04-04 MED ORDER — ABOBOTULINUMTOXINA 500 UNITS IM SOLR
500.0000 [IU] | Freq: Once | INTRAMUSCULAR | Status: AC
Start: 2024-04-04 — End: 2024-04-04
  Administered 2024-04-04: 500 [IU] via INTRAMUSCULAR

## 2024-04-04 NOTE — Patient Instructions (Signed)
 ALWAYS FEEL FREE TO CALL OUR OFFICE WITH ANY PROBLEMS OR QUESTIONS [phone])  **PLEASE NOTE** ALL MEDICATION REFILL REQUESTS (INCLUDING CONTROLLED SUBSTANCES) NEED TO BE MADE AT LEAST 7 DAYS PRIOR TO REFILL BEING DUE. ANY REFILL REQUESTS INSIDE THAT TIME FRAME MAY RESULT IN DELAYS IN RECEIVING YOUR PRESCRIPTION.

## 2024-04-04 NOTE — Progress Notes (Signed)
 Dysport  Injection for spasticity using needle EMG guidance Indication:  Cervical dystonia     Dilution: 500 Units/40ml        Total Units Injected: 800 Indication: Severe spasticity which interferes with ADL,mobility and/or  hygiene and is unresponsive to medication management and other conservative care Informed consent was obtained after describing risks and benefits of the procedure with the patient. This includes bleeding, bruising, infection, excessive weakness, or medication side effects. A REMS form is on file and signed.   Side: left  Needle: 50mm injectable monopolar needle electrode   Number of units per muscle Trapezius 150 units 3 access points Levator scapulae 50 units  2  access points Sternocleidomastoid 400 units 4 access points Scalenes 0 units 0 access points Tricep 200 u 4 access pts     All injections were done after obtaining appropriate EMG activity and after negative drawback for blood. The patient tolerated the procedure well. Post procedure instructions were given.     Consider increase to 1000 u for next visit

## 2024-07-11 ENCOUNTER — Encounter: Payer: Self-pay | Admitting: Physical Medicine & Rehabilitation

## 2024-07-11 ENCOUNTER — Encounter
Payer: PRIVATE HEALTH INSURANCE | Attending: Physical Medicine & Rehabilitation | Admitting: Physical Medicine & Rehabilitation

## 2024-07-11 VITALS — BP 129/81 | HR 85 | Ht 67.0 in | Wt 129.0 lb

## 2024-07-11 DIAGNOSIS — G8114 Spastic hemiplegia affecting left nondominant side: Secondary | ICD-10-CM | POA: Diagnosis present

## 2024-07-11 DIAGNOSIS — G243 Spasmodic torticollis: Secondary | ICD-10-CM | POA: Diagnosis not present

## 2024-07-11 MED ORDER — ABOBOTULINUMTOXINA 500 UNITS IM SOLR
1000.0000 [IU] | Freq: Once | INTRAMUSCULAR | Status: AC
  Administered 2024-07-11: 1000 [IU] via INTRAMUSCULAR

## 2024-07-11 NOTE — Patient Instructions (Signed)
 ALWAYS FEEL FREE TO CALL OUR OFFICE WITH ANY PROBLEMS OR QUESTIONS [phone])  **PLEASE NOTE** ALL MEDICATION REFILL REQUESTS (INCLUDING CONTROLLED SUBSTANCES) NEED TO BE MADE AT LEAST 7 DAYS PRIOR TO REFILL BEING DUE. ANY REFILL REQUESTS INSIDE THAT TIME FRAME MAY RESULT IN DELAYS IN RECEIVING YOUR PRESCRIPTION.

## 2024-07-11 NOTE — Progress Notes (Signed)
 Dysport  Injection for spasticity using needle EMG guidance Indication:  Cervical dystonia   G24.3  G81.14 Dilution: 500 Units/2ml        Total Units Injected: 1000 Indication: Severe spasticity which interferes with ADL,mobility and/or  hygiene and is unresponsive to medication management and other conservative care Informed consent was obtained after describing risks and benefits of the procedure with the patient. This includes bleeding, bruising, infection, excessive weakness, or medication side effects. A REMS form is on file and signed.   Side: left  Needle: 50mm injectable monopolar needle electrode   Number of units per muscle Trapezius 200 units 4 access points Levator scapulae 100 units  2  access points Sternocleidomastoid 400 units 4 access points Scalenes 50 units 2 access points Triceps 250 units 4 access pts     All injections were done after obtaining appropriate EMG activity and after negative drawback for blood. The patient tolerated the procedure well. Post procedure instructions were given.

## 2024-10-10 ENCOUNTER — Encounter: Payer: Self-pay | Admitting: Physical Medicine & Rehabilitation

## 2024-10-10 ENCOUNTER — Encounter
Payer: PRIVATE HEALTH INSURANCE | Attending: Physical Medicine & Rehabilitation | Admitting: Physical Medicine & Rehabilitation

## 2024-10-10 VITALS — BP 114/69 | HR 83

## 2024-10-10 DIAGNOSIS — G243 Spasmodic torticollis: Secondary | ICD-10-CM | POA: Diagnosis not present

## 2024-10-10 DIAGNOSIS — G8114 Spastic hemiplegia affecting left nondominant side: Secondary | ICD-10-CM | POA: Insufficient documentation

## 2024-10-10 MED ORDER — SODIUM CHLORIDE (PF) 0.9 % IJ SOLN: 10.0000 mL | INTRAMUSCULAR | Status: AC | PRN

## 2024-10-10 MED ORDER — ABOBOTULINUMTOXINA 500 UNITS IM SOLR
500.0000 [IU] | Freq: Once | INTRAMUSCULAR | Status: AC
  Administered 2024-10-10: 500 [IU] via INTRAMUSCULAR

## 2024-10-10 NOTE — Progress Notes (Signed)
 Dysport  Injection for spasticity using needle EMG guidance Indication:  Cervical dystonia G24.3, G81.14    Dilution: 500 Units/50ml        Total Units Injected: 1000 Indication: Severe spasticity which interferes with ADL,mobility and/or  hygiene and is unresponsive to medication management and other conservative care Informed consent was obtained after describing risks and benefits of the procedure with the patient. This includes bleeding, bruising, infection, excessive weakness, or medication side effects. A REMS form is on file and signed.   Side: left  Needle: 50mm injectable monopolar needle electrode   Number of units per muscle Trapezius 300 units 3 access points Levator scapulae 150 units  3  access points Sternocleidomastoid 400 units 4 access points Scalenes 150 units 3 access points     All injections were done after obtaining appropriate EMG activity and after negative drawback for blood. The patient tolerated the procedure well. Post procedure instructions were given.

## 2024-10-10 NOTE — Patient Instructions (Signed)
 ALWAYS FEEL FREE TO CALL OUR OFFICE WITH ANY PROBLEMS OR QUESTIONS [phone])  **PLEASE NOTE** ALL MEDICATION REFILL REQUESTS (INCLUDING CONTROLLED SUBSTANCES) NEED TO BE MADE AT LEAST 7 DAYS PRIOR TO REFILL BEING DUE. ANY REFILL REQUESTS INSIDE THAT TIME FRAME MAY RESULT IN DELAYS IN RECEIVING YOUR PRESCRIPTION.

## 2025-01-09 ENCOUNTER — Encounter: Payer: PRIVATE HEALTH INSURANCE | Admitting: Physical Medicine & Rehabilitation
